# Patient Record
Sex: Female | Born: 1970
Health system: Southern US, Community
[De-identification: ages and names within clinical notes are randomized; demographics above are authoritative.]

## PROBLEM LIST (undated history)

## (undated) DIAGNOSIS — T783XXA Angioneurotic edema, initial encounter: Secondary | ICD-10-CM

## (undated) DIAGNOSIS — F419 Anxiety disorder, unspecified: Secondary | ICD-10-CM

## (undated) DIAGNOSIS — L509 Urticaria, unspecified: Secondary | ICD-10-CM

## (undated) DIAGNOSIS — R51 Headache: Secondary | ICD-10-CM

## (undated) DIAGNOSIS — F32A Depression, unspecified: Secondary | ICD-10-CM

## (undated) DIAGNOSIS — K589 Irritable bowel syndrome without diarrhea: Secondary | ICD-10-CM

## (undated) DIAGNOSIS — F319 Bipolar disorder, unspecified: Secondary | ICD-10-CM

## (undated) DIAGNOSIS — F329 Major depressive disorder, single episode, unspecified: Secondary | ICD-10-CM

## (undated) HISTORY — PX: FOOT SURGERY: SHX648

## (undated) HISTORY — DX: Urticaria, unspecified: L50.9

## (undated) HISTORY — DX: Headache: R51

## (undated) HISTORY — DX: Anxiety disorder, unspecified: F41.9

## (undated) HISTORY — DX: Major depressive disorder, single episode, unspecified: F32.9

## (undated) HISTORY — DX: Angioneurotic edema, initial encounter: T78.3XXA

## (undated) HISTORY — DX: Bipolar disorder, unspecified: F31.9

## (undated) HISTORY — DX: Irritable bowel syndrome, unspecified: K58.9

## (undated) HISTORY — DX: Depression, unspecified: F32.A

---

## 1998-08-24 ENCOUNTER — Other Ambulatory Visit: Admission: RE | Admit: 1998-08-24 | Discharge: 1998-08-24 | Payer: Self-pay | Admitting: *Deleted

## 1998-09-01 ENCOUNTER — Other Ambulatory Visit: Admission: RE | Admit: 1998-09-01 | Discharge: 1998-09-01 | Payer: Self-pay | Admitting: *Deleted

## 1999-11-15 ENCOUNTER — Other Ambulatory Visit: Admission: RE | Admit: 1999-11-15 | Discharge: 1999-11-15 | Payer: Self-pay | Admitting: Obstetrics and Gynecology

## 2008-09-14 ENCOUNTER — Encounter (HOSPITAL_COMMUNITY): Admission: RE | Admit: 2008-09-14 | Discharge: 2008-10-14 | Payer: Self-pay | Admitting: Unknown Physician Specialty

## 2009-10-25 ENCOUNTER — Encounter (HOSPITAL_COMMUNITY): Admission: RE | Admit: 2009-10-25 | Discharge: 2009-11-24 | Payer: Self-pay | Admitting: Unknown Physician Specialty

## 2010-05-22 ENCOUNTER — Emergency Department (HOSPITAL_COMMUNITY)
Admission: EM | Admit: 2010-05-22 | Discharge: 2010-05-23 | Payer: Self-pay | Source: Home / Self Care | Admitting: Emergency Medicine

## 2010-05-24 ENCOUNTER — Emergency Department (HOSPITAL_COMMUNITY)
Admission: EM | Admit: 2010-05-24 | Discharge: 2010-05-24 | Payer: Self-pay | Source: Home / Self Care | Admitting: Emergency Medicine

## 2010-05-24 LAB — PREGNANCY, URINE: Preg Test, Ur: NEGATIVE

## 2010-05-28 ENCOUNTER — Encounter: Payer: Self-pay | Admitting: Neurosurgery

## 2010-05-29 LAB — URINALYSIS, ROUTINE W REFLEX MICROSCOPIC
Ketones, ur: 15 mg/dL — AB
Leukocytes, UA: NEGATIVE
Nitrite: NEGATIVE
Specific Gravity, Urine: >1.03 — ABNORMAL HIGH (ref 1.005–1.030)
Urine Glucose, Fasting: NEGATIVE mg/dL
Urobilinogen, UA: 0.2 mg/dL (ref 0.0–1.0)
pH: 6 (ref 5.0–8.0)

## 2010-05-29 LAB — HEPATIC FUNCTION PANEL
ALT: 16 U/L (ref 0–35)
AST: 21 U/L (ref 0–37)
Albumin: 3.8 g/dL (ref 3.5–5.2)
Alkaline Phosphatase: 43 U/L (ref 39–117)
Bilirubin, Direct: <0.1 mg/dL (ref 0.0–0.3)
Total Bilirubin: 0.4 mg/dL (ref 0.3–1.2)
Total Protein: 6.9 g/dL (ref 6.0–8.3)

## 2010-05-29 LAB — CBC
HCT: 36.7 % (ref 36.0–46.0)
Hemoglobin: 13.1 g/dL (ref 12.0–15.0)
MCH: 31 pg (ref 26.0–34.0)
MCHC: 35.7 g/dL (ref 30.0–36.0)
MCV: 86.8 fL (ref 78.0–100.0)
Platelets: 232 10*3/uL (ref 150–400)
RBC: 4.23 MIL/uL (ref 3.87–5.11)
RDW: 13.1 % (ref 11.5–15.5)
WBC: 9.6 10*3/uL (ref 4.0–10.5)

## 2010-05-29 LAB — LIPASE, BLOOD: Lipase: 21 U/L (ref 11–59)

## 2010-05-29 LAB — BASIC METABOLIC PANEL
BUN: 12 mg/dL (ref 6–23)
CO2: 24 mEq/L (ref 19–32)
Calcium: 9.2 mg/dL (ref 8.4–10.5)
Chloride: 106 mEq/L (ref 96–112)
Creatinine, Ser: 0.81 mg/dL (ref 0.4–1.2)
GFR calc Af Amer: >60 mL/min (ref >60)
GFR calc non Af Amer: >60 mL/min (ref >60)
Glucose, Bld: 108 mg/dL — ABNORMAL HIGH (ref 70–99)
Potassium: 3.8 mEq/L (ref 3.5–5.1)
Sodium: 138 mEq/L (ref 135–145)

## 2010-05-29 LAB — URINE MICROSCOPIC-ADD ON

## 2010-05-29 LAB — DIFFERENTIAL
Basophils Absolute: 0 10*3/uL (ref 0.0–0.1)
Basophils Relative: 0 % (ref 0–1)
Eosinophils Absolute: 0 10*3/uL (ref 0.0–0.7)
Eosinophils Relative: 0 % (ref 0–5)
Lymphocytes Relative: 16 % (ref 12–46)
Lymphs Abs: 1.5 10*3/uL (ref 0.7–4.0)
Monocytes Absolute: 1.2 10*3/uL — ABNORMAL HIGH (ref 0.1–1.0)
Monocytes Relative: 13 % — ABNORMAL HIGH (ref 3–12)
Neutro Abs: 6.8 10*3/uL (ref 1.7–7.7)
Neutrophils Relative %: 71 % (ref 43–77)

## 2010-05-29 LAB — PREGNANCY, URINE: Preg Test, Ur: NEGATIVE

## 2010-06-04 ENCOUNTER — Emergency Department (HOSPITAL_COMMUNITY)
Admission: EM | Admit: 2010-06-04 | Discharge: 2010-06-04 | Payer: Self-pay | Source: Home / Self Care | Admitting: Emergency Medicine

## 2010-06-04 LAB — COMPREHENSIVE METABOLIC PANEL
ALT: 12 U/L (ref 0–35)
AST: 15 U/L (ref 0–37)
Albumin: 3.9 g/dL (ref 3.5–5.2)
Alkaline Phosphatase: 44 U/L (ref 39–117)
BUN: 10 mg/dL (ref 6–23)
CO2: 24 mEq/L (ref 19–32)
Calcium: 9.4 mg/dL (ref 8.4–10.5)
Chloride: 103 mEq/L (ref 96–112)
Creatinine, Ser: 0.95 mg/dL (ref 0.4–1.2)
GFR calc Af Amer: >60 mL/min (ref >60)
GFR calc non Af Amer: >60 mL/min (ref >60)
Glucose, Bld: 102 mg/dL — ABNORMAL HIGH (ref 70–99)
Potassium: 3.7 mEq/L (ref 3.5–5.1)
Sodium: 137 mEq/L (ref 135–145)
Total Bilirubin: 0.4 mg/dL (ref 0.3–1.2)
Total Protein: 7 g/dL (ref 6.0–8.3)

## 2010-06-04 LAB — CBC
HCT: 40.2 % (ref 36.0–46.0)
Hemoglobin: 13.8 g/dL (ref 12.0–15.0)
MCH: 30.3 pg (ref 26.0–34.0)
MCHC: 34.3 g/dL (ref 30.0–36.0)
MCV: 88.2 fL (ref 78.0–100.0)
Platelets: 261 10*3/uL (ref 150–400)
RBC: 4.56 MIL/uL (ref 3.87–5.11)
RDW: 13.1 % (ref 11.5–15.5)
WBC: 7.7 10*3/uL (ref 4.0–10.5)

## 2010-06-04 LAB — DIFFERENTIAL
Basophils Absolute: 0 10*3/uL (ref 0.0–0.1)
Basophils Relative: 0 % (ref 0–1)
Eosinophils Absolute: 0 10*3/uL (ref 0.0–0.7)
Eosinophils Relative: 0 % (ref 0–5)
Lymphocytes Relative: 23 % (ref 12–46)
Lymphs Abs: 1.8 10*3/uL (ref 0.7–4.0)
Monocytes Absolute: 0.6 10*3/uL (ref 0.1–1.0)
Monocytes Relative: 8 % (ref 3–12)
Neutro Abs: 5.3 10*3/uL (ref 1.7–7.7)
Neutrophils Relative %: 69 % (ref 43–77)

## 2010-06-04 LAB — POCT CARDIAC MARKERS
CKMB, poc: <1 ng/mL — ABNORMAL LOW (ref 1.0–8.0)
Myoglobin, poc: 43.7 ng/mL (ref 12–200)
Troponin i, poc: <0.05 ng/mL (ref 0.00–0.09)

## 2012-04-02 IMAGING — CT CT ABD-PELV W/ CM
2 of 5 series · 16 of 46 positions shown, 18 images · IV contrast (Omnipaque 300)
Comparison: None.

CLINICAL DATA: Right-sided abdominal pain with diarrhea.  History
of diverticulitis.

CT ABDOMEN AND PELVIS WITH CONTRAST
TECHNIQUE: Multidetector CT imaging of the abdomen and pelvis was
performed following the standard protocol during bolus
administration of intravenous contrast.
Contrast: 100 ml Imnipaque-QTT intravenously.

[Series 2: abd_pel_with 5.0 b40f · axial · 0.63mm/px · z∈[-456,-82]mm · 13 of 85 slices shown, 15 images]
[im 5/85  soft-tissue]
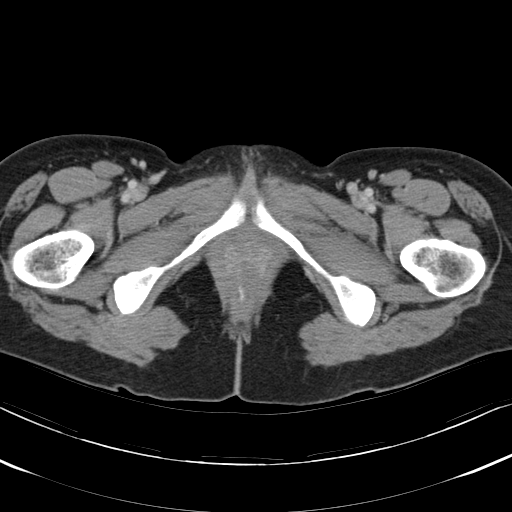
[im 5/85  bone]
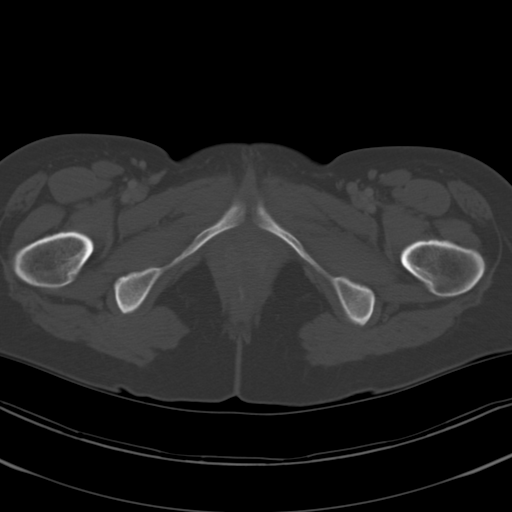
[im 10/85  soft-tissue]
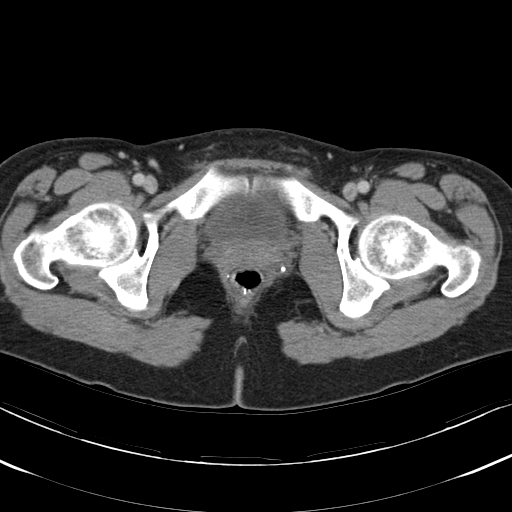
[im 19/85  soft-tissue]
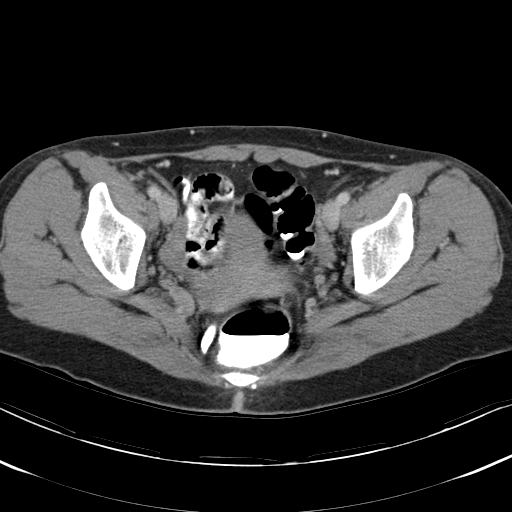
[im 24/85  soft-tissue]
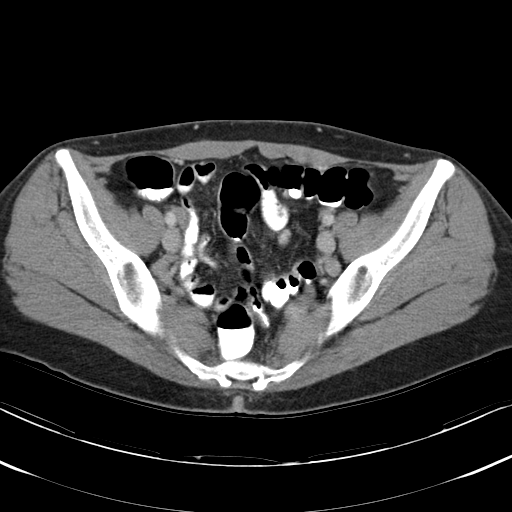
[im 29/85  soft-tissue]
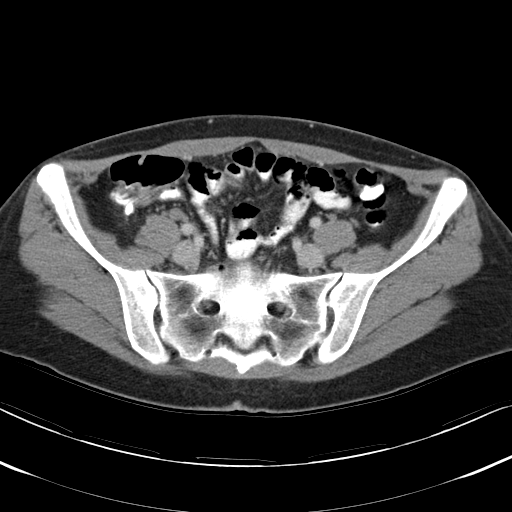
[im 38/85  soft-tissue]
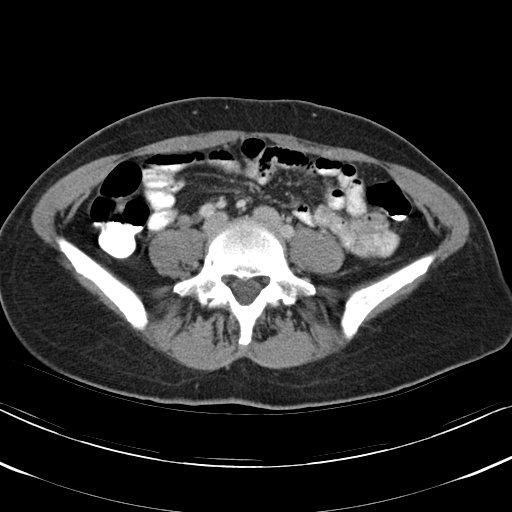
[im 43/85  soft-tissue]
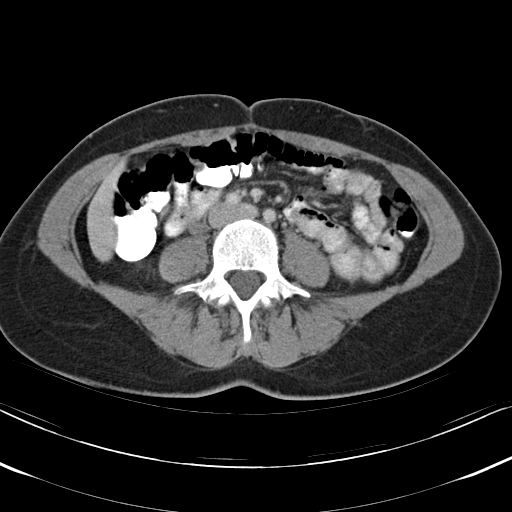
[im 47/85  soft-tissue]
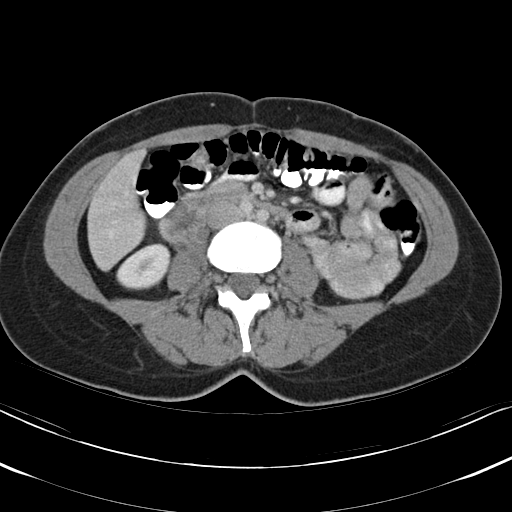
[im 57/85  soft-tissue]
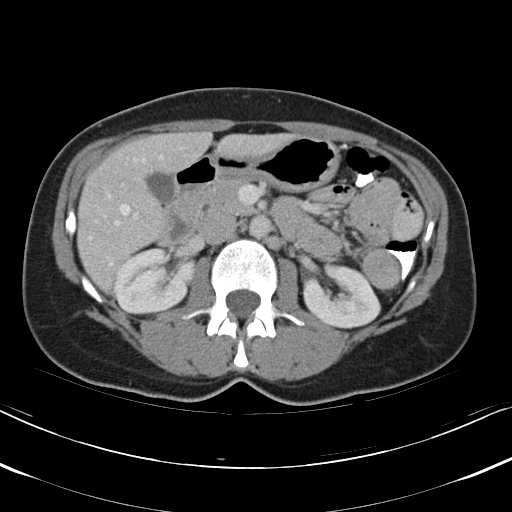
[im 57/85  bone]
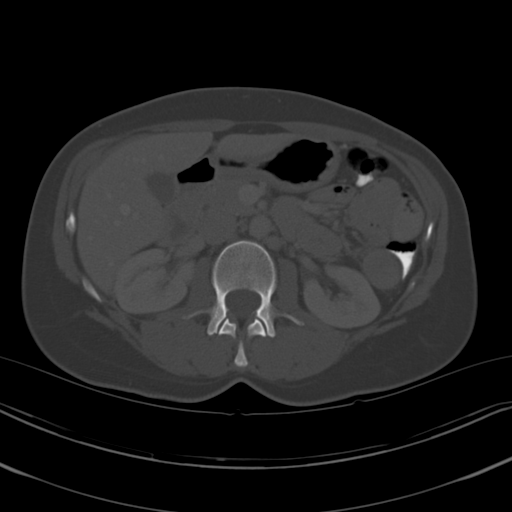
[im 61/85  soft-tissue]
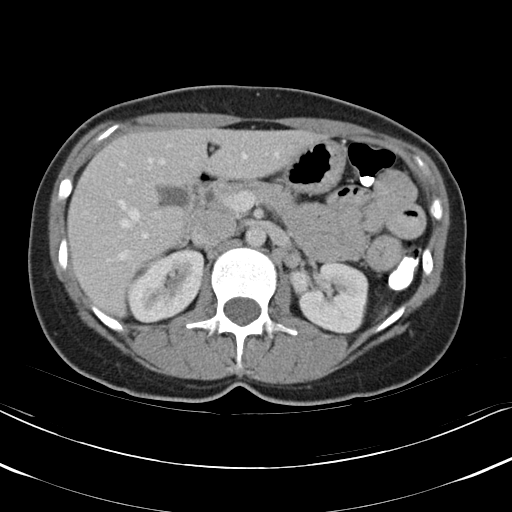
[im 66/85  soft-tissue]
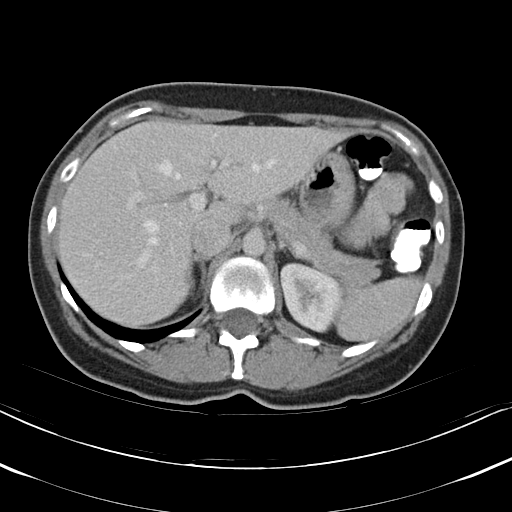
[im 75/85  soft-tissue]
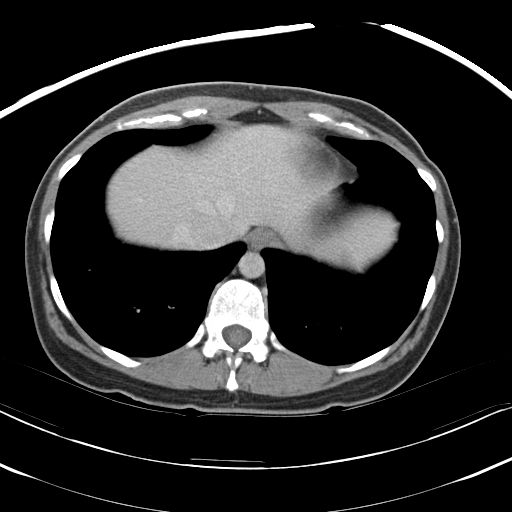
[im 80/85  soft-tissue]
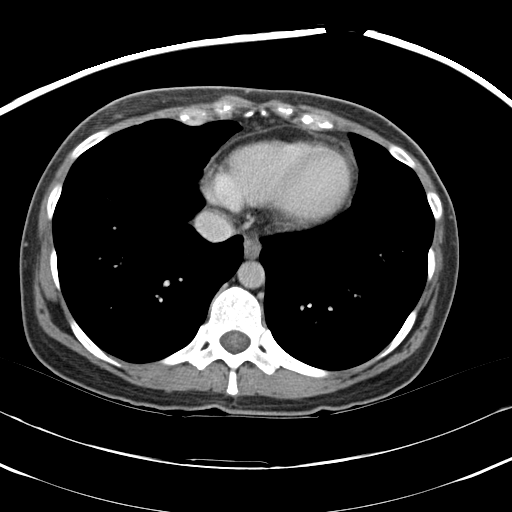

[Series 4: abd_pel_with 3.0 spo cor · coronal · 0.68mm/px · 3 of 70 slices shown]
[im 24/70  soft-tissue]
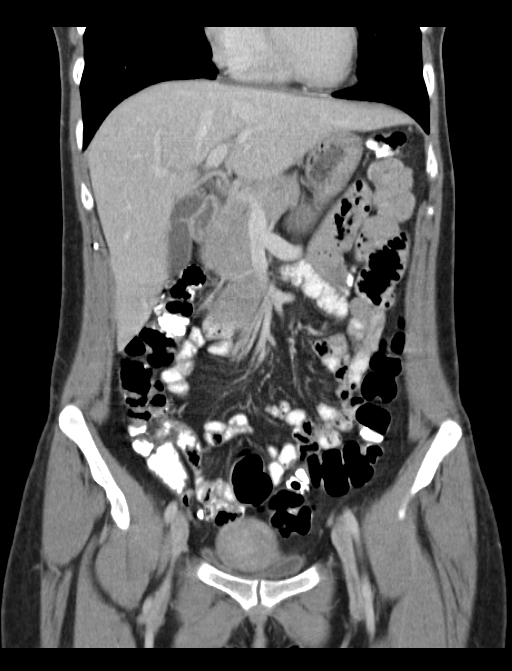
[im 31/70  soft-tissue]
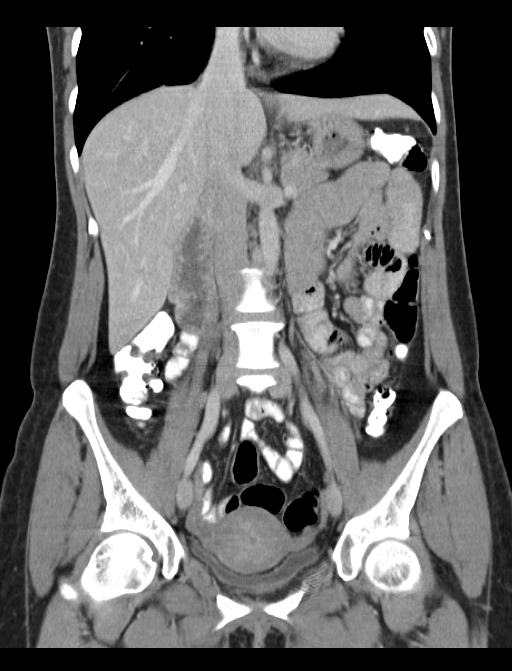
[im 39/70  soft-tissue]
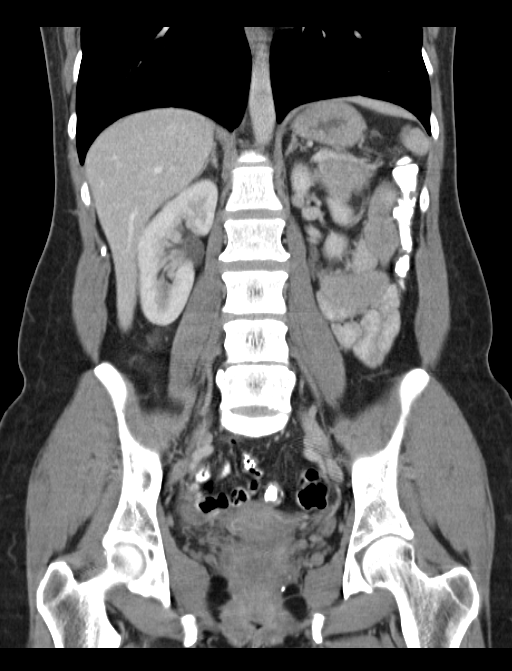

[16 of 46 positions shown; findings below may reference images not displayed]

FINDINGS: The lung bases are clear.  There is no pleural effusion.
The liver, spleen, gallbladder, pancreas, adrenal glands and
kidneys appear normal.  There is mild right-sided proximal
ureterectasis without delay in contrast excretion.  No urinary
tract calculi are demonstrated.

The bowel gas pattern is normal.  The appendix appears normal.
There are no inflammatory changes.  Portions of the sigmoid colon
are incompletely distended and suboptimally evaluated.  The uterus
and ovaries appear normal.

No osseous abnormalities are seen.
IMPRESSION: No acute or significant findings.  No evidence of appendicitis,
colitis or diverticulitis.

## 2013-02-13 ENCOUNTER — Ambulatory Visit (INDEPENDENT_AMBULATORY_CARE_PROVIDER_SITE_OTHER): Payer: Medicare Other | Admitting: Psychiatry

## 2013-02-13 ENCOUNTER — Encounter (HOSPITAL_COMMUNITY): Payer: Self-pay | Admitting: Psychiatry

## 2013-02-13 VITALS — BP 90/68 | Ht 64.0 in | Wt 111.0 lb

## 2013-02-13 DIAGNOSIS — F3162 Bipolar disorder, current episode mixed, moderate: Secondary | ICD-10-CM | POA: Insufficient documentation

## 2013-02-13 DIAGNOSIS — F411 Generalized anxiety disorder: Secondary | ICD-10-CM

## 2013-02-13 DIAGNOSIS — F3160 Bipolar disorder, current episode mixed, unspecified: Secondary | ICD-10-CM

## 2013-02-13 MED ORDER — ZIPRASIDONE HCL 20 MG PO CAPS: 20.0000 mg | ORAL_CAPSULE | Freq: Every day | ORAL | Status: DC

## 2013-02-13 MED ORDER — FLUOXETINE HCL 10 MG PO CAPS: 10.0000 mg | ORAL_CAPSULE | Freq: Every day | ORAL | Status: DC

## 2013-02-13 MED ORDER — QUETIAPINE FUMARATE 100 MG PO TABS: 200.0000 mg | ORAL_TABLET | Freq: Every day | ORAL | Status: DC

## 2013-02-13 MED ORDER — DIAZEPAM 5 MG PO TABS: 5.0000 mg | ORAL_TABLET | Freq: Three times a day (TID) | ORAL | Status: DC
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
## 2013-03-13 ENCOUNTER — Encounter (HOSPITAL_COMMUNITY): Payer: Self-pay | Admitting: Psychiatry

## 2013-03-13 ENCOUNTER — Ambulatory Visit (INDEPENDENT_AMBULATORY_CARE_PROVIDER_SITE_OTHER): Payer: Medicare Other | Admitting: Psychiatry

## 2013-03-13 VITALS — BP 90/58 | Ht 64.0 in | Wt 115.0 lb

## 2013-03-13 DIAGNOSIS — F3162 Bipolar disorder, current episode mixed, moderate: Secondary | ICD-10-CM

## 2013-03-13 DIAGNOSIS — F411 Generalized anxiety disorder: Secondary | ICD-10-CM

## 2013-03-13 DIAGNOSIS — F3160 Bipolar disorder, current episode mixed, unspecified: Secondary | ICD-10-CM

## 2013-03-13 MED ORDER — DIAZEPAM 10 MG PO TABS: 10.0000 mg | ORAL_TABLET | Freq: Three times a day (TID) | ORAL | Status: DC

## 2013-03-13 MED ORDER — BUPROPION HCL 75 MG PO TABS: 75.0000 mg | ORAL_TABLET | ORAL | Status: DC
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
## 2013-03-14 LAB — VALPROIC ACID LEVEL: Valproic Acid Lvl: 33.8 ug/mL — ABNORMAL LOW (ref 50.0–100.0)

## 2013-03-16 ENCOUNTER — Telehealth (HOSPITAL_COMMUNITY): Payer: Self-pay | Admitting: Psychiatry

## 2013-03-16 ENCOUNTER — Other Ambulatory Visit (HOSPITAL_COMMUNITY): Payer: Self-pay | Admitting: Psychiatry

## 2013-03-16 MED ORDER — DIVALPROEX SODIUM 250 MG PO DR TAB: DELAYED_RELEASE_TABLET | ORAL | Status: DC

## 2013-03-16 NOTE — Telephone Encounter (Signed)
Pt's Depakote level too low at 33.8-told to gradually increase to 500 mg bid.new script sent to pharmacy

## 2013-04-10 ENCOUNTER — Ambulatory Visit (INDEPENDENT_AMBULATORY_CARE_PROVIDER_SITE_OTHER): Payer: Medicare Other | Admitting: Psychiatry

## 2013-04-10 ENCOUNTER — Encounter (HOSPITAL_COMMUNITY): Payer: Self-pay | Admitting: Psychiatry

## 2013-04-10 VITALS — BP 82/60 | Ht 64.0 in | Wt 116.0 lb

## 2013-04-10 DIAGNOSIS — F3162 Bipolar disorder, current episode mixed, moderate: Secondary | ICD-10-CM

## 2013-04-10 DIAGNOSIS — F3160 Bipolar disorder, current episode mixed, unspecified: Secondary | ICD-10-CM

## 2013-04-10 DIAGNOSIS — F411 Generalized anxiety disorder: Secondary | ICD-10-CM

## 2013-04-10 MED ORDER — QUETIAPINE FUMARATE 100 MG PO TABS: 200.0000 mg | ORAL_TABLET | Freq: Every day | ORAL | Status: DC
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
## 2013-05-12 ENCOUNTER — Ambulatory Visit (INDEPENDENT_AMBULATORY_CARE_PROVIDER_SITE_OTHER): Payer: Medicare Other | Admitting: Psychiatry

## 2013-05-12 ENCOUNTER — Encounter (HOSPITAL_COMMUNITY): Payer: Self-pay | Admitting: Psychiatry

## 2013-05-12 VITALS — BP 90/66 | Ht 64.0 in | Wt 114.0 lb

## 2013-05-12 DIAGNOSIS — F3160 Bipolar disorder, current episode mixed, unspecified: Secondary | ICD-10-CM

## 2013-05-12 DIAGNOSIS — F3162 Bipolar disorder, current episode mixed, moderate: Secondary | ICD-10-CM

## 2013-05-12 DIAGNOSIS — F411 Generalized anxiety disorder: Secondary | ICD-10-CM

## 2013-05-12 MED ORDER — AMPHETAMINE-DEXTROAMPHETAMINE 15 MG PO TABS: 15.0000 mg | ORAL_TABLET | Freq: Every day | ORAL | Status: DC

## 2013-05-12 MED ORDER — ZIPRASIDONE HCL 20 MG PO CAPS: 20.0000 mg | ORAL_CAPSULE | Freq: Every day | ORAL | Status: DC
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
## 2013-06-08 ENCOUNTER — Ambulatory Visit (HOSPITAL_COMMUNITY): Payer: Self-pay | Admitting: Psychiatry

## 2013-06-15 ENCOUNTER — Ambulatory Visit (INDEPENDENT_AMBULATORY_CARE_PROVIDER_SITE_OTHER): Payer: Medicare Other | Admitting: Psychiatry

## 2013-06-15 ENCOUNTER — Encounter (HOSPITAL_COMMUNITY): Payer: Self-pay | Admitting: Psychiatry

## 2013-06-15 VITALS — BP 90/60 | Ht 64.0 in | Wt 117.0 lb

## 2013-06-15 DIAGNOSIS — F411 Generalized anxiety disorder: Secondary | ICD-10-CM

## 2013-06-15 DIAGNOSIS — F3160 Bipolar disorder, current episode mixed, unspecified: Secondary | ICD-10-CM

## 2013-06-15 DIAGNOSIS — F3162 Bipolar disorder, current episode mixed, moderate: Secondary | ICD-10-CM

## 2013-06-15 MED ORDER — METHYLPHENIDATE HCL ER (OSM) 18 MG PO TBCR
18.0000 mg | EXTENDED_RELEASE_TABLET | Freq: Every day | ORAL | Status: DC
Start: 2013-06-15 — End: 2013-09-01

## 2013-06-15 MED ORDER — DIVALPROEX SODIUM 250 MG PO DR TAB: DELAYED_RELEASE_TABLET | ORAL | Status: DC

## 2013-06-15 MED ORDER — ALPRAZOLAM 1 MG PO TABS: 1.0000 mg | ORAL_TABLET | Freq: Three times a day (TID) | ORAL | Status: DC | PRN

## 2013-06-15 MED ORDER — QUETIAPINE FUMARATE 100 MG PO TABS: 200.0000 mg | ORAL_TABLET | Freq: Every day | ORAL | Status: DC

## 2013-06-15 MED ORDER — METHYLPHENIDATE HCL ER (OSM) 18 MG PO TBCR: 18.0000 mg | EXTENDED_RELEASE_TABLET | Freq: Every day | ORAL | Status: DC
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
## 2013-08-13 ENCOUNTER — Ambulatory Visit (HOSPITAL_COMMUNITY): Payer: Self-pay | Admitting: Psychiatry

## 2013-08-18 ENCOUNTER — Ambulatory Visit (HOSPITAL_COMMUNITY): Payer: Self-pay | Admitting: Psychiatry

## 2013-08-31 ENCOUNTER — Ambulatory Visit (HOSPITAL_COMMUNITY): Payer: Self-pay | Admitting: Psychiatry

## 2013-09-01 ENCOUNTER — Encounter (HOSPITAL_COMMUNITY): Payer: Self-pay | Admitting: Psychiatry

## 2013-09-01 ENCOUNTER — Ambulatory Visit (INDEPENDENT_AMBULATORY_CARE_PROVIDER_SITE_OTHER): Payer: Medicare Other | Admitting: Psychiatry

## 2013-09-01 VITALS — BP 90/60 | Ht 64.0 in | Wt 120.0 lb

## 2013-09-01 DIAGNOSIS — F411 Generalized anxiety disorder: Secondary | ICD-10-CM

## 2013-09-01 DIAGNOSIS — F3160 Bipolar disorder, current episode mixed, unspecified: Secondary | ICD-10-CM

## 2013-09-01 DIAGNOSIS — F3162 Bipolar disorder, current episode mixed, moderate: Secondary | ICD-10-CM

## 2013-09-01 MED ORDER — ALPRAZOLAM 1 MG PO TABS: 1.0000 mg | ORAL_TABLET | Freq: Three times a day (TID) | ORAL | Status: DC | PRN

## 2013-09-01 MED ORDER — AMPHETAMINE-DEXTROAMPHETAMINE 15 MG PO TABS
15.0000 mg | ORAL_TABLET | Freq: Every day | ORAL | Status: DC
Start: 2013-09-01 — End: 2013-09-22

## 2013-09-01 MED ORDER — AMPHETAMINE-DEXTROAMPHETAMINE 15 MG PO TABS: 15.0000 mg | ORAL_TABLET | Freq: Every day | ORAL | Status: DC

## 2013-09-01 MED ORDER — QUETIAPINE FUMARATE 100 MG PO TABS: 200.0000 mg | ORAL_TABLET | Freq: Every day | ORAL | Status: DC

## 2013-09-01 MED ORDER — DIVALPROEX SODIUM 250 MG PO DR TAB: DELAYED_RELEASE_TABLET | ORAL | Status: DC
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
## 2013-09-22 ENCOUNTER — Ambulatory Visit (INDEPENDENT_AMBULATORY_CARE_PROVIDER_SITE_OTHER): Payer: Medicare Other | Admitting: Psychiatry

## 2013-09-22 ENCOUNTER — Encounter (HOSPITAL_COMMUNITY): Payer: Self-pay | Admitting: Psychiatry

## 2013-09-22 VITALS — BP 90/62 | Ht 64.0 in | Wt 116.0 lb

## 2013-09-22 DIAGNOSIS — F411 Generalized anxiety disorder: Secondary | ICD-10-CM

## 2013-09-22 DIAGNOSIS — F3162 Bipolar disorder, current episode mixed, moderate: Secondary | ICD-10-CM

## 2013-09-22 DIAGNOSIS — F3160 Bipolar disorder, current episode mixed, unspecified: Secondary | ICD-10-CM

## 2013-09-22 MED ORDER — AMPHETAMINE-DEXTROAMPHETAMINE 15 MG PO TABS: 15.0000 mg | ORAL_TABLET | Freq: Every day | ORAL | Status: DC
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
## 2013-09-29 ENCOUNTER — Ambulatory Visit (HOSPITAL_COMMUNITY): Payer: Self-pay | Admitting: Psychiatry

## 2013-10-26 ENCOUNTER — Telehealth (HOSPITAL_COMMUNITY): Payer: Self-pay | Admitting: *Deleted

## 2013-10-26 NOTE — Telephone Encounter (Signed)
done

## 2013-11-05 ENCOUNTER — Encounter (HOSPITAL_COMMUNITY): Payer: Self-pay | Admitting: Psychiatry

## 2013-11-05 ENCOUNTER — Ambulatory Visit (INDEPENDENT_AMBULATORY_CARE_PROVIDER_SITE_OTHER): Payer: Medicare Other | Admitting: Psychiatry

## 2013-11-05 VITALS — BP 88/60 | Ht 64.0 in | Wt 114.0 lb

## 2013-11-05 DIAGNOSIS — F3162 Bipolar disorder, current episode mixed, moderate: Secondary | ICD-10-CM

## 2013-11-05 DIAGNOSIS — F411 Generalized anxiety disorder: Secondary | ICD-10-CM

## 2013-11-05 DIAGNOSIS — F3160 Bipolar disorder, current episode mixed, unspecified: Secondary | ICD-10-CM

## 2013-11-05 MED ORDER — ALPRAZOLAM 1 MG PO TABS
1.0000 mg | ORAL_TABLET | Freq: Three times a day (TID) | ORAL | Status: DC | PRN
Start: 2013-11-05 — End: 2014-01-18

## 2013-11-05 MED ORDER — AMPHETAMINE-DEXTROAMPHETAMINE 15 MG PO TABS: 15.0000 mg | ORAL_TABLET | Freq: Two times a day (BID) | ORAL | Status: DC

## 2013-11-05 MED ORDER — QUETIAPINE FUMARATE 100 MG PO TABS: ORAL_TABLET | ORAL | Status: DC
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
## 2013-11-24 ENCOUNTER — Ambulatory Visit (HOSPITAL_COMMUNITY): Payer: Self-pay | Admitting: Psychiatry

## 2013-11-24 ENCOUNTER — Telehealth (HOSPITAL_COMMUNITY): Payer: Self-pay | Admitting: *Deleted

## 2013-11-24 NOTE — Telephone Encounter (Signed)
Told to stop Latuda, use benadryl

## 2013-12-17 ENCOUNTER — Encounter (HOSPITAL_COMMUNITY): Payer: Self-pay | Admitting: Psychiatry

## 2013-12-17 ENCOUNTER — Ambulatory Visit (INDEPENDENT_AMBULATORY_CARE_PROVIDER_SITE_OTHER): Payer: Medicare Other | Admitting: Psychiatry

## 2013-12-17 VITALS — BP 92/54 | HR 72 | Ht 64.0 in | Wt 113.8 lb

## 2013-12-17 DIAGNOSIS — F3160 Bipolar disorder, current episode mixed, unspecified: Secondary | ICD-10-CM

## 2013-12-17 DIAGNOSIS — F3162 Bipolar disorder, current episode mixed, moderate: Secondary | ICD-10-CM

## 2013-12-17 DIAGNOSIS — F411 Generalized anxiety disorder: Secondary | ICD-10-CM

## 2013-12-17 MED ORDER — QUETIAPINE FUMARATE 100 MG PO TABS: 100.0000 mg | ORAL_TABLET | Freq: Every day | ORAL | Status: DC

## 2013-12-17 MED ORDER — AMPHETAMINE-DEXTROAMPHETAMINE 15 MG PO TABS: 15.0000 mg | ORAL_TABLET | Freq: Two times a day (BID) | ORAL | Status: DC

## 2013-12-17 MED ORDER — VILAZODONE HCL 20 MG PO TABS: 20.0000 mg | ORAL_TABLET | Freq: Every day | ORAL | Status: DC
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
## 2013-12-21 ENCOUNTER — Telehealth (HOSPITAL_COMMUNITY): Payer: Self-pay | Admitting: *Deleted

## 2013-12-21 ENCOUNTER — Encounter (HOSPITAL_COMMUNITY): Payer: Self-pay | Admitting: Psychiatry

## 2013-12-21 MED ORDER — LURASIDONE HCL 20 MG PO TABS: 20.0000 mg | ORAL_TABLET | Freq: Every day | ORAL | Status: DC

## 2013-12-21 NOTE — Telephone Encounter (Signed)
pt calling stating that her Vibyrd she started taking is not working. She is having nausea and feels very sick when she takes it. Pt states she D/C meds. Pt states she would like to restart on Latuda or something else.

## 2013-12-21 NOTE — Telephone Encounter (Signed)
latuda 20 mg daily sent in

## 2013-12-25 ENCOUNTER — Telehealth (HOSPITAL_COMMUNITY): Payer: Self-pay | Admitting: *Deleted

## 2013-12-25 NOTE — Telephone Encounter (Signed)
called pt to inform her Dr. Harrington Challenger and I had called her insurance company to try to get her Latuda approved and we are currently waiting for a response. Pt agreed. Also informed pt that we still have not started the processes on her Vilbryd. Pt then stated her Pharmacy should have not requested for this med to get prior Auth because this medication made her sick and need to be off her med list. I informed pt due to what she is telling me on how this medication is making her feel, I will be putting this medication on her Allergy list. Pt agreed.

## 2013-12-25 NOTE — Telephone Encounter (Signed)
noted 

## 2013-12-28 ENCOUNTER — Encounter (HOSPITAL_COMMUNITY): Payer: Self-pay | Admitting: *Deleted

## 2013-12-28 NOTE — Progress Notes (Signed)
Prior Auth [id number] Medication: Latuda 20mg  Cardholder ID #: [id number] Group Number#: [id number] Authorize Dates: 12-25-2013 until 12-26-2015

## 2014-01-18 ENCOUNTER — Encounter (HOSPITAL_COMMUNITY): Payer: Self-pay | Admitting: Psychiatry

## 2014-01-18 ENCOUNTER — Ambulatory Visit (INDEPENDENT_AMBULATORY_CARE_PROVIDER_SITE_OTHER): Payer: Medicare Other | Admitting: Psychiatry

## 2014-01-18 VITALS — BP 107/62 | HR 103 | Ht 64.0 in | Wt 113.4 lb

## 2014-01-18 DIAGNOSIS — F411 Generalized anxiety disorder: Secondary | ICD-10-CM

## 2014-01-18 DIAGNOSIS — F3160 Bipolar disorder, current episode mixed, unspecified: Secondary | ICD-10-CM

## 2014-01-18 DIAGNOSIS — F3162 Bipolar disorder, current episode mixed, moderate: Secondary | ICD-10-CM

## 2014-01-18 MED ORDER — LURASIDONE HCL 20 MG PO TABS: 20.0000 mg | ORAL_TABLET | Freq: Every day | ORAL | Status: DC

## 2014-01-18 MED ORDER — ALPRAZOLAM 1 MG PO TABS: 1.0000 mg | ORAL_TABLET | Freq: Three times a day (TID) | ORAL | Status: DC | PRN

## 2014-01-18 MED ORDER — QUETIAPINE FUMARATE 100 MG PO TABS: 100.0000 mg | ORAL_TABLET | Freq: Every day | ORAL | Status: DC

## 2014-01-18 MED ORDER — AMPHETAMINE-DEXTROAMPHETAMINE 15 MG PO TABS
15.0000 mg | ORAL_TABLET | Freq: Two times a day (BID) | ORAL | Status: DC
Start: 2014-01-18 — End: 2014-03-18

## 2014-01-18 MED ORDER — AMPHETAMINE-DEXTROAMPHETAMINE 15 MG PO TABS: 15.0000 mg | ORAL_TABLET | Freq: Two times a day (BID) | ORAL | Status: DC
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
## 2014-03-18 ENCOUNTER — Telehealth (HOSPITAL_COMMUNITY): Payer: Self-pay | Admitting: *Deleted

## 2014-03-18 ENCOUNTER — Ambulatory Visit (INDEPENDENT_AMBULATORY_CARE_PROVIDER_SITE_OTHER): Payer: Medicare Other | Admitting: Psychiatry

## 2014-03-18 ENCOUNTER — Encounter (HOSPITAL_COMMUNITY): Payer: Self-pay | Admitting: Psychiatry

## 2014-03-18 VITALS — BP 110/55 | HR 82 | Ht 64.0 in | Wt 114.2 lb

## 2014-03-18 DIAGNOSIS — F411 Generalized anxiety disorder: Secondary | ICD-10-CM

## 2014-03-18 DIAGNOSIS — F3162 Bipolar disorder, current episode mixed, moderate: Secondary | ICD-10-CM

## 2014-03-18 MED ORDER — ALPRAZOLAM 1 MG PO TABS: 1.0000 mg | ORAL_TABLET | Freq: Three times a day (TID) | ORAL | Status: DC | PRN

## 2014-03-18 MED ORDER — QUETIAPINE FUMARATE 100 MG PO TABS: ORAL_TABLET | ORAL | Status: DC

## 2014-03-18 MED ORDER — AMPHETAMINE-DEXTROAMPHETAMINE 15 MG PO TABS: 15.0000 mg | ORAL_TABLET | Freq: Two times a day (BID) | ORAL | Status: DC
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
## 2014-04-02 ENCOUNTER — Ambulatory Visit (HOSPITAL_COMMUNITY): Payer: Self-pay | Admitting: Psychiatry

## 2014-05-18 ENCOUNTER — Ambulatory Visit (HOSPITAL_COMMUNITY): Payer: Self-pay | Admitting: Psychiatry

## 2014-06-04 ENCOUNTER — Ambulatory Visit (HOSPITAL_COMMUNITY): Payer: Self-pay | Admitting: Psychiatry

## 2014-06-10 ENCOUNTER — Encounter (HOSPITAL_COMMUNITY): Payer: Self-pay | Admitting: Psychiatry

## 2014-06-10 ENCOUNTER — Ambulatory Visit (INDEPENDENT_AMBULATORY_CARE_PROVIDER_SITE_OTHER): Payer: Medicare Other | Admitting: Psychiatry

## 2014-06-10 VITALS — BP 98/61 | HR 95 | Ht 64.0 in | Wt 115.0 lb

## 2014-06-10 DIAGNOSIS — F411 Generalized anxiety disorder: Secondary | ICD-10-CM

## 2014-06-10 DIAGNOSIS — F3162 Bipolar disorder, current episode mixed, moderate: Secondary | ICD-10-CM

## 2014-06-10 MED ORDER — QUETIAPINE FUMARATE 100 MG PO TABS: ORAL_TABLET | ORAL | Status: DC

## 2014-06-10 MED ORDER — DIAZEPAM 10 MG PO TABS: 10.0000 mg | ORAL_TABLET | Freq: Four times a day (QID) | ORAL | Status: DC

## 2014-06-10 MED ORDER — AMPHETAMINE-DEXTROAMPHETAMINE 15 MG PO TABS: 15.0000 mg | ORAL_TABLET | Freq: Two times a day (BID) | ORAL | Status: DC
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
## 2014-07-08 ENCOUNTER — Encounter (HOSPITAL_COMMUNITY): Payer: Self-pay | Admitting: Psychiatry

## 2014-07-08 ENCOUNTER — Ambulatory Visit (INDEPENDENT_AMBULATORY_CARE_PROVIDER_SITE_OTHER): Payer: Medicare Other | Admitting: Psychiatry

## 2014-07-08 VITALS — BP 92/63 | HR 98 | Ht 64.0 in | Wt 118.2 lb

## 2014-07-08 DIAGNOSIS — F3162 Bipolar disorder, current episode mixed, moderate: Secondary | ICD-10-CM | POA: Diagnosis not present

## 2014-07-08 DIAGNOSIS — F411 Generalized anxiety disorder: Secondary | ICD-10-CM | POA: Diagnosis not present

## 2014-07-08 MED ORDER — AMPHETAMINE-DEXTROAMPHETAMINE 15 MG PO TABS: 15.0000 mg | ORAL_TABLET | Freq: Two times a day (BID) | ORAL | Status: DC

## 2014-07-08 MED ORDER — SERTRALINE HCL 50 MG PO TABS
50.0000 mg | ORAL_TABLET | Freq: Every day | ORAL | Status: DC
Start: 2014-07-08 — End: 2014-09-09

## 2014-07-08 MED ORDER — QUETIAPINE FUMARATE 100 MG PO TABS: ORAL_TABLET | ORAL | Status: DC
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
## 2014-08-18 ENCOUNTER — Encounter (HOSPITAL_COMMUNITY): Payer: Self-pay | Admitting: Psychiatry

## 2014-08-18 ENCOUNTER — Ambulatory Visit (INDEPENDENT_AMBULATORY_CARE_PROVIDER_SITE_OTHER): Payer: Medicare Other | Admitting: Psychiatry

## 2014-08-18 VITALS — BP 101/57 | HR 78 | Ht 64.0 in | Wt 112.0 lb

## 2014-08-18 DIAGNOSIS — F411 Generalized anxiety disorder: Secondary | ICD-10-CM | POA: Diagnosis not present

## 2014-08-18 DIAGNOSIS — F3160 Bipolar disorder, current episode mixed, unspecified: Secondary | ICD-10-CM

## 2014-08-18 DIAGNOSIS — F3162 Bipolar disorder, current episode mixed, moderate: Secondary | ICD-10-CM

## 2014-08-18 MED ORDER — AMPHETAMINE-DEXTROAMPHETAMINE 15 MG PO TABS: 15.0000 mg | ORAL_TABLET | Freq: Two times a day (BID) | ORAL | Status: DC

## 2014-08-18 MED ORDER — DIAZEPAM 10 MG PO TABS: 10.0000 mg | ORAL_TABLET | Freq: Four times a day (QID) | ORAL | Status: DC

## 2014-08-18 MED ORDER — QUETIAPINE FUMARATE 100 MG PO TABS: ORAL_TABLET | ORAL | Status: DC
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
## 2014-08-23 ENCOUNTER — Telehealth (HOSPITAL_COMMUNITY): Payer: Self-pay | Admitting: *Deleted

## 2014-08-23 NOTE — Telephone Encounter (Signed)
Pt called stating that since she's been on Zoloft, she have been feeling nauseous, sick to her stomach all the way into the next day. Per pt she have also been fatigue as well. Pt number is [phone].

## 2014-08-24 NOTE — Telephone Encounter (Signed)
Please tell her to d/c it

## 2014-08-27 NOTE — Telephone Encounter (Signed)
Pt is aware and showed understanding. Pt made a sooner appt to come in due to not feeling well.

## 2014-08-31 ENCOUNTER — Other Ambulatory Visit (HOSPITAL_COMMUNITY): Payer: Self-pay | Admitting: Psychiatry

## 2014-08-31 ENCOUNTER — Telehealth (HOSPITAL_COMMUNITY): Payer: Self-pay | Admitting: *Deleted

## 2014-08-31 NOTE — Telephone Encounter (Signed)
noted 

## 2014-08-31 NOTE — Telephone Encounter (Signed)
phone call from patient.  everything has gotten worse since her last visit, can't eat, feels like getting on a plane, leaving and never looking back.   feels like she is crashing.

## 2014-08-31 NOTE — Telephone Encounter (Signed)
Called pt, she is not suicidal, will come in on May 5

## 2014-09-09 ENCOUNTER — Encounter (HOSPITAL_COMMUNITY): Payer: Self-pay | Admitting: Psychiatry

## 2014-09-09 ENCOUNTER — Ambulatory Visit (INDEPENDENT_AMBULATORY_CARE_PROVIDER_SITE_OTHER): Payer: Medicare Other | Admitting: Psychiatry

## 2014-09-09 VITALS — BP 82/60 | Ht 64.0 in | Wt 112.0 lb

## 2014-09-09 DIAGNOSIS — F411 Generalized anxiety disorder: Secondary | ICD-10-CM | POA: Diagnosis not present

## 2014-09-09 DIAGNOSIS — F3160 Bipolar disorder, current episode mixed, unspecified: Secondary | ICD-10-CM | POA: Diagnosis not present

## 2014-09-09 DIAGNOSIS — F3162 Bipolar disorder, current episode mixed, moderate: Secondary | ICD-10-CM

## 2014-09-09 MED ORDER — ALPRAZOLAM 1 MG PO TABS: 1.0000 mg | ORAL_TABLET | Freq: Four times a day (QID) | ORAL | Status: DC

## 2014-09-09 MED ORDER — AMPHETAMINE-DEXTROAMPHETAMINE 15 MG PO TABS: 15.0000 mg | ORAL_TABLET | Freq: Two times a day (BID) | ORAL | Status: DC

## 2014-09-09 MED ORDER — DULOXETINE HCL 30 MG PO CPEP: 30.0000 mg | ORAL_CAPSULE | Freq: Two times a day (BID) | ORAL | Status: DC
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
## 2014-09-13 ENCOUNTER — Telehealth (HOSPITAL_COMMUNITY): Payer: Self-pay | Admitting: *Deleted
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
## 2014-09-14 NOTE — Telephone Encounter (Signed)
noted 

## 2014-09-29 ENCOUNTER — Ambulatory Visit (HOSPITAL_COMMUNITY): Payer: Self-pay | Admitting: Psychiatry

## 2014-10-13 ENCOUNTER — Ambulatory Visit (INDEPENDENT_AMBULATORY_CARE_PROVIDER_SITE_OTHER): Payer: Medicare Other | Admitting: Psychiatry

## 2014-10-13 ENCOUNTER — Encounter (HOSPITAL_COMMUNITY): Payer: Self-pay | Admitting: Psychiatry

## 2014-10-13 VITALS — BP 91/58 | HR 103 | Ht 64.0 in | Wt 106.0 lb

## 2014-10-13 DIAGNOSIS — F3160 Bipolar disorder, current episode mixed, unspecified: Secondary | ICD-10-CM

## 2014-10-13 DIAGNOSIS — F411 Generalized anxiety disorder: Secondary | ICD-10-CM

## 2014-10-13 DIAGNOSIS — F3162 Bipolar disorder, current episode mixed, moderate: Secondary | ICD-10-CM

## 2014-10-13 MED ORDER — AMPHETAMINE-DEXTROAMPHETAMINE 15 MG PO TABS: 15.0000 mg | ORAL_TABLET | Freq: Two times a day (BID) | ORAL | Status: DC

## 2014-10-13 MED ORDER — DULOXETINE HCL 30 MG PO CPEP: 30.0000 mg | ORAL_CAPSULE | Freq: Two times a day (BID) | ORAL | Status: DC

## 2014-10-13 MED ORDER — QUETIAPINE FUMARATE 100 MG PO TABS: ORAL_TABLET | ORAL | Status: DC
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
## 2014-11-01 ENCOUNTER — Telehealth (HOSPITAL_COMMUNITY): Payer: Self-pay | Admitting: *Deleted

## 2014-11-01 NOTE — Telephone Encounter (Signed)
Pt called stating that she have been getting lightheadedness and dizzy. Per pt she had passed out so she went to her PCP (Dr. Manuella Ghazi) and he wanted Dr. Harrington Challenger to see if Seroquel dosage could be the one is causing her dizziness. Per pt, it this is the issue, then she would like to know if Dr. Harrington Challenger could try putting her on another sleeping meds instead. Per pt she is taking Valium with the Seroquel at night but not Xanax at night. Per pt, she feels like she have lost her strength. Pt number is [phone]. Pt f/u appt is scheduled for 12-09-14 and f/u with her PCP next month.

## 2014-11-02 NOTE — Telephone Encounter (Signed)
She stopped cymbalta and this should help doubt this is from Seroquel

## 2014-11-09 NOTE — Telephone Encounter (Signed)
noted 

## 2014-12-09 ENCOUNTER — Ambulatory Visit (HOSPITAL_COMMUNITY): Payer: Self-pay | Admitting: Psychiatry

## 2015-01-04 ENCOUNTER — Encounter (HOSPITAL_COMMUNITY): Payer: Self-pay | Admitting: Psychiatry

## 2015-01-04 ENCOUNTER — Ambulatory Visit (INDEPENDENT_AMBULATORY_CARE_PROVIDER_SITE_OTHER): Payer: Medicare Other | Admitting: Psychiatry

## 2015-01-04 VITALS — BP 97/56 | HR 83 | Ht 64.0 in | Wt 108.4 lb

## 2015-01-04 DIAGNOSIS — F3160 Bipolar disorder, current episode mixed, unspecified: Secondary | ICD-10-CM

## 2015-01-04 DIAGNOSIS — F3162 Bipolar disorder, current episode mixed, moderate: Secondary | ICD-10-CM

## 2015-01-04 DIAGNOSIS — F411 Generalized anxiety disorder: Secondary | ICD-10-CM

## 2015-01-04 MED ORDER — AMPHETAMINE-DEXTROAMPHETAMINE 15 MG PO TABS: 15.0000 mg | ORAL_TABLET | Freq: Two times a day (BID) | ORAL | Status: DC

## 2015-01-04 MED ORDER — LORAZEPAM 1 MG PO TABS: 1.0000 mg | ORAL_TABLET | Freq: Four times a day (QID) | ORAL | Status: DC

## 2015-01-04 MED ORDER — QUETIAPINE FUMARATE 100 MG PO TABS: ORAL_TABLET | ORAL | Status: DC
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
## 2015-03-03 ENCOUNTER — Ambulatory Visit (INDEPENDENT_AMBULATORY_CARE_PROVIDER_SITE_OTHER): Payer: Medicare Other | Admitting: Psychiatry

## 2015-03-03 ENCOUNTER — Encounter (HOSPITAL_COMMUNITY): Payer: Self-pay | Admitting: Psychiatry

## 2015-03-03 VITALS — BP 94/61 | HR 105 | Ht 64.0 in | Wt 112.0 lb

## 2015-03-03 DIAGNOSIS — F411 Generalized anxiety disorder: Secondary | ICD-10-CM

## 2015-03-03 DIAGNOSIS — F3162 Bipolar disorder, current episode mixed, moderate: Secondary | ICD-10-CM

## 2015-03-03 MED ORDER — QUETIAPINE FUMARATE 100 MG PO TABS: ORAL_TABLET | ORAL | Status: DC

## 2015-03-03 MED ORDER — ALPRAZOLAM 1 MG PO TABS: 1.0000 mg | ORAL_TABLET | Freq: Three times a day (TID) | ORAL | Status: DC

## 2015-03-03 MED ORDER — AMPHETAMINE-DEXTROAMPHETAMINE 15 MG PO TABS: 15.0000 mg | ORAL_TABLET | Freq: Two times a day (BID) | ORAL | Status: DC
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
## 2015-04-22 ENCOUNTER — Encounter (HOSPITAL_COMMUNITY): Payer: Self-pay | Admitting: Psychiatry

## 2015-04-22 ENCOUNTER — Ambulatory Visit (INDEPENDENT_AMBULATORY_CARE_PROVIDER_SITE_OTHER): Payer: Medicare Other | Admitting: Psychiatry

## 2015-04-22 VITALS — BP 108/62 | Ht 64.0 in | Wt 113.0 lb

## 2015-04-22 DIAGNOSIS — F419 Anxiety disorder, unspecified: Secondary | ICD-10-CM | POA: Diagnosis not present

## 2015-04-22 DIAGNOSIS — F3162 Bipolar disorder, current episode mixed, moderate: Secondary | ICD-10-CM

## 2015-04-22 MED ORDER — QUETIAPINE FUMARATE 100 MG PO TABS: ORAL_TABLET | ORAL | Status: DC

## 2015-04-22 MED ORDER — QUETIAPINE FUMARATE ER 50 MG PO TB24: 50.0000 mg | ORAL_TABLET | ORAL | Status: DC

## 2015-04-22 MED ORDER — AMPHETAMINE-DEXTROAMPHETAMINE 15 MG PO TABS: 15.0000 mg | ORAL_TABLET | Freq: Two times a day (BID) | ORAL | Status: DC

## 2015-04-22 MED ORDER — ALPRAZOLAM 1 MG PO TABS: 1.0000 mg | ORAL_TABLET | Freq: Three times a day (TID) | ORAL | Status: DC
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
## 2015-05-31 ENCOUNTER — Other Ambulatory Visit (HOSPITAL_COMMUNITY): Payer: Self-pay | Admitting: Psychiatry

## 2015-05-31 ENCOUNTER — Telehealth (HOSPITAL_COMMUNITY): Payer: Self-pay | Admitting: *Deleted

## 2015-05-31 MED ORDER — QUETIAPINE FUMARATE 100 MG PO TABS: ORAL_TABLET | ORAL | Status: DC

## 2015-05-31 MED ORDER — QUETIAPINE FUMARATE ER 50 MG PO TB24: 50.0000 mg | ORAL_TABLET | ORAL | Status: DC

## 2015-05-31 NOTE — Telephone Encounter (Signed)
Pt pharmacy requesting a 90 days supply for pt Seroquel 100 mg due to insurance. Pt pharmacy number is [phone]

## 2015-05-31 NOTE — Telephone Encounter (Signed)
done

## 2015-05-31 NOTE — Telephone Encounter (Signed)
noted 

## 2015-06-23 ENCOUNTER — Encounter (HOSPITAL_COMMUNITY): Payer: Self-pay | Admitting: Psychiatry

## 2015-06-23 ENCOUNTER — Ambulatory Visit (INDEPENDENT_AMBULATORY_CARE_PROVIDER_SITE_OTHER): Payer: Medicare HMO | Admitting: Psychiatry

## 2015-06-23 VITALS — BP 108/63 | HR 111 | Ht 64.0 in | Wt 111.8 lb

## 2015-06-23 DIAGNOSIS — F3162 Bipolar disorder, current episode mixed, moderate: Secondary | ICD-10-CM | POA: Diagnosis not present

## 2015-06-23 MED ORDER — AMPHETAMINE-DEXTROAMPHETAMINE 15 MG PO TABS: 15.0000 mg | ORAL_TABLET | Freq: Two times a day (BID) | ORAL | Status: DC

## 2015-06-23 MED ORDER — QUETIAPINE FUMARATE ER 50 MG PO TB24
100.0000 mg | ORAL_TABLET | Freq: Every day | ORAL | Status: DC
Start: 2015-06-23 — End: 2015-10-13

## 2015-06-23 MED ORDER — ALPRAZOLAM 1 MG PO TABS
1.0000 mg | ORAL_TABLET | Freq: Three times a day (TID) | ORAL | Status: DC
Start: 2015-06-23 — End: 2015-10-13

## 2015-06-23 MED ORDER — QUETIAPINE FUMARATE 100 MG PO TABS: ORAL_TABLET | ORAL | Status: DC
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
## 2015-08-15 ENCOUNTER — Telehealth (HOSPITAL_COMMUNITY): Payer: Self-pay | Admitting: *Deleted

## 2015-08-15 NOTE — Telephone Encounter (Signed)
Per pt, she do not intend on going to another provider and is aware that her medication is too early to refill. Per pt, she just wanted to make sure that she don't run out of medications while her insurance try to work out how they can approve Dr. Harrington Challenger under her plan. Pt number is [phone].

## 2015-08-15 NOTE — Telephone Encounter (Signed)
Pt called stating she have medications until 08-30-2015 but will run out of medications after then. Per pt, she have [employer] and is looking into how to get Dr. Harrington Challenger approved. Per pt, she would need refills for her Adderall and Xanax. Pt umber is [phone].

## 2015-08-15 NOTE — Telephone Encounter (Signed)
Too early to refill now. If she is going to another provider we can only provide 30 days of refills.

## 2015-08-18 ENCOUNTER — Ambulatory Visit (HOSPITAL_COMMUNITY): Payer: Self-pay | Admitting: Psychiatry

## 2015-08-23 ENCOUNTER — Telehealth (HOSPITAL_COMMUNITY): Payer: Self-pay | Admitting: *Deleted

## 2015-08-23 NOTE — Telephone Encounter (Signed)
Pt called stating she will be out of her Adderall on 08-30-15 and would like to see if Dr. Harrington Challenger could print her refill. Pt number is [phone]. Pt medication last filled on 06-23-15 with 2 prints.

## 2015-08-29 ENCOUNTER — Other Ambulatory Visit (HOSPITAL_COMMUNITY): Payer: Self-pay | Admitting: Psychiatry

## 2015-08-29 MED ORDER — AMPHETAMINE-DEXTROAMPHETAMINE 15 MG PO TABS: 15.0000 mg | ORAL_TABLET | Freq: Two times a day (BID) | ORAL | Status: DC

## 2015-08-29 NOTE — Telephone Encounter (Signed)
printed

## 2015-08-29 NOTE — Telephone Encounter (Signed)
Spoke with pt and informed her that script is ready for pick up.

## 2015-09-05 ENCOUNTER — Encounter (HOSPITAL_COMMUNITY): Payer: Self-pay | Admitting: *Deleted

## 2015-09-05 NOTE — Progress Notes (Signed)
Pt came into office to pick up her Addreall. Pt D/L number is [phone].

## 2015-09-29 ENCOUNTER — Ambulatory Visit (HOSPITAL_COMMUNITY): Payer: Self-pay | Admitting: Psychiatry

## 2015-10-11 ENCOUNTER — Telehealth (HOSPITAL_COMMUNITY): Payer: Self-pay | Admitting: *Deleted

## 2015-10-11 NOTE — Telephone Encounter (Signed)
Called the office insurance person to call [employer] with the code that was provided by pt and they stated they will call [employer] for more information.

## 2015-10-11 NOTE — Telephone Encounter (Signed)
patient called, said Harle Battiest told her to call two days before appointment regarding [employer]'s approval.

## 2015-10-12 NOTE — Telephone Encounter (Signed)
lmtcb

## 2015-10-12 NOTE — Telephone Encounter (Signed)
Called pt and received the voicemail. lmtcb and number was provided

## 2015-10-13 ENCOUNTER — Encounter (HOSPITAL_COMMUNITY): Payer: Self-pay | Admitting: Psychiatry

## 2015-10-13 ENCOUNTER — Telehealth (HOSPITAL_COMMUNITY): Payer: Self-pay | Admitting: *Deleted

## 2015-10-13 ENCOUNTER — Ambulatory Visit (INDEPENDENT_AMBULATORY_CARE_PROVIDER_SITE_OTHER): Payer: Medicare HMO | Admitting: Psychiatry

## 2015-10-13 ENCOUNTER — Other Ambulatory Visit (HOSPITAL_COMMUNITY): Payer: Self-pay | Admitting: Psychiatry

## 2015-10-13 VITALS — BP 81/49 | HR 95 | Ht 64.0 in | Wt 112.0 lb

## 2015-10-13 DIAGNOSIS — F411 Generalized anxiety disorder: Secondary | ICD-10-CM | POA: Diagnosis not present

## 2015-10-13 DIAGNOSIS — F3162 Bipolar disorder, current episode mixed, moderate: Secondary | ICD-10-CM

## 2015-10-13 MED ORDER — QUETIAPINE FUMARATE ER 50 MG PO TB24: 100.0000 mg | ORAL_TABLET | Freq: Every day | ORAL | Status: DC

## 2015-10-13 MED ORDER — AMPHETAMINE-DEXTROAMPHETAMINE 15 MG PO TABS: 15.0000 mg | ORAL_TABLET | Freq: Two times a day (BID) | ORAL | Status: DC

## 2015-10-13 MED ORDER — QUETIAPINE FUMARATE 100 MG PO TABS: ORAL_TABLET | ORAL | Status: DC

## 2015-10-13 MED ORDER — ALPRAZOLAM 1 MG PO TABS: 1.0000 mg | ORAL_TABLET | Freq: Three times a day (TID) | ORAL | Status: DC

## 2015-10-13 NOTE — Telephone Encounter (Signed)
Pt came into office for f/u visit with provider and printed scripts were given to her then.

## 2015-10-13 NOTE — Telephone Encounter (Signed)
Called pt insurance to get Auth approval for medication management and therapy. Auth approval for medication management is [id number] and good for 15 visits starting 10-13-2015 until 05-06-2016. Auth approval for Therapy is [id number] with 20 visits starting 10-13-2015 until 05-06-2016. Spoke with Caryl Pina who stated letter of auth of approval will be mailed to office. [phone].

## 2015-10-13 NOTE — Telephone Encounter (Signed)
Done, she will have to pick up xanax and adderall, others sent

## 2015-10-13 NOTE — Telephone Encounter (Signed)
Pt called asking if Dr. Harrington Challenger could please fill her medications that she prescribed for her. Per pt, she have [employer] and called them to see if Dr. Harrington Challenger was in network several months ago. Per pt, after back and fourth with the insurance company, she was told that office had to call them and request a 022 waiver form for them to pay for Dr. Harrington Challenger visits. Per pt, the person she talked to at her insurance company informed her that it was on a case by case bases. Sent this message to Levada Dy (person that does insurance for office) via email. Per Arbutus Ped email, she spoke with [facility] @ [employer] [phone] x [phone] who stated that Dr. Harrington Challenger is out of network for this patient's plan and they will not pay for her to be seen by Harrington Challenger. Called pt to inform her of this. Pt did not liked the information that was given to her and stated after she hangs up she will call [employer] again. Per pt, she is out of refills and don't know what to do. Per pt for staff to ask Dr. Harrington Challenger if she could fill her medications for her with an extra refill while she tries to figure out what to do with her insurance. Per pt, she can not afford to pay for office visits out of pocket. Pt number is [phone].
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
## 2015-10-13 NOTE — Telephone Encounter (Signed)
Pt called asking if Dr. Harrington Challenger could please fill her medications that she prescribed for her. Per pt, she have [employer] and called them to see if Dr. Harrington Challenger was in network several months ago. Per pt, after back and fourth with the insurance company, she was told that office had to call them and request a 022 waiver form for them to pay for Dr. Harrington Challenger visits. Per pt, the person she talked to at her insurance company informed her that it was on a case by case bases. Sent this message to Levada Dy (person that does insurance for office) via email. Per Ar[employer] Ped email, she spoke with [facility] @ [employer] [phone] x [phone] who stated that Dr. Harrington Challenger is out of network for this patient's plan and they will not pay for her to be seen by Harrington Challenger. Called pt to inform her of this. Pt did not liked the information that was given to her and stated after she hangs up she will call [employer] again. Per pt, she is out of refills and don't know what to do. Per pt for staff to ask Dr. Harrington Challenger if she could fill her medications for her with an extra refill while she tries to figure out what to do with her insurance. Per pt, she can not afford to pay for office visits out of pocket. Pt number is [phone].

## 2015-11-03 ENCOUNTER — Encounter (HOSPITAL_COMMUNITY): Payer: Self-pay | Admitting: Psychiatry

## 2015-11-03 ENCOUNTER — Ambulatory Visit (INDEPENDENT_AMBULATORY_CARE_PROVIDER_SITE_OTHER): Payer: Medicare HMO | Admitting: Psychiatry

## 2015-11-03 DIAGNOSIS — F3162 Bipolar disorder, current episode mixed, moderate: Secondary | ICD-10-CM

## 2015-11-03 NOTE — Patient Instructions (Signed)
Discussed orally 
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
## 2015-11-22 ENCOUNTER — Encounter (HOSPITAL_COMMUNITY): Payer: Self-pay | Admitting: Psychiatry

## 2015-11-22 ENCOUNTER — Ambulatory Visit (INDEPENDENT_AMBULATORY_CARE_PROVIDER_SITE_OTHER): Payer: Medicare HMO | Admitting: Psychiatry

## 2015-11-22 VITALS — BP 100/55 | HR 69 | Ht 64.0 in | Wt 111.6 lb

## 2015-11-22 DIAGNOSIS — F3162 Bipolar disorder, current episode mixed, moderate: Secondary | ICD-10-CM | POA: Diagnosis not present

## 2015-11-22 DIAGNOSIS — F419 Anxiety disorder, unspecified: Secondary | ICD-10-CM

## 2015-11-22 MED ORDER — AMPHETAMINE-DEXTROAMPHETAMINE 15 MG PO TABS
15.0000 mg | ORAL_TABLET | Freq: Two times a day (BID) | ORAL | Status: DC
Start: 2015-11-22 — End: 2016-01-23

## 2015-11-22 MED ORDER — AMPHETAMINE-DEXTROAMPHETAMINE 15 MG PO TABS: 15.0000 mg | ORAL_TABLET | Freq: Two times a day (BID) | ORAL | Status: DC

## 2015-11-22 MED ORDER — ALPRAZOLAM 1 MG PO TABS: 1.0000 mg | ORAL_TABLET | Freq: Three times a day (TID) | ORAL | Status: DC

## 2015-11-22 MED ORDER — TRAZODONE HCL 100 MG PO TABS: 100.0000 mg | ORAL_TABLET | Freq: Every day | ORAL | Status: DC
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
## 2015-11-23 ENCOUNTER — Encounter (HOSPITAL_COMMUNITY): Payer: Self-pay | Admitting: Psychiatry

## 2015-11-23 ENCOUNTER — Ambulatory Visit (INDEPENDENT_AMBULATORY_CARE_PROVIDER_SITE_OTHER): Payer: Medicare HMO | Admitting: Psychiatry

## 2015-11-23 DIAGNOSIS — F3162 Bipolar disorder, current episode mixed, moderate: Secondary | ICD-10-CM

## 2015-11-23 NOTE — Patient Instructions (Signed)
Discussed orally 
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
## 2015-12-01 ENCOUNTER — Telehealth (HOSPITAL_COMMUNITY): Payer: Self-pay | Admitting: *Deleted

## 2015-12-01 NOTE — Telephone Encounter (Signed)
phone call from patient, stated the Trazadone 100 mg. is only lasting about four and one half hour.   she is going on vacation and she need to sleep.   It is not putting her to sleep.

## 2015-12-01 NOTE — Telephone Encounter (Signed)
She can try taking one and a half or 2

## 2015-12-01 NOTE — Telephone Encounter (Signed)
Pt called stating that her Trazodone is not working. Per pt, Trazodone is only lasting for 4.5 hours and is going on a vacation soon and need to sleep. Pt would like to know what to do. Pt number is [phone]

## 2015-12-02 NOTE — Telephone Encounter (Signed)
Called pt and informed her with what provider stated. Pt verbalized understanding. Informed pt to finish the medications with the new directions and before she runs out to call office. Pt verbalized understanding.

## 2015-12-07 ENCOUNTER — Ambulatory Visit (HOSPITAL_COMMUNITY): Payer: Self-pay | Admitting: Psychiatry

## 2015-12-08 ENCOUNTER — Telehealth (HOSPITAL_COMMUNITY): Payer: Self-pay | Admitting: *Deleted

## 2015-12-08 NOTE — Telephone Encounter (Signed)
phone call from patient, said Trazodone was increased.  she has been constipated for about 8 days, her PC think it may be the Trazodone.   She said she is going back on the Seroquel until she hears from Dr. Harrington Challenger.  Her goal is to get off the Seroquel.

## 2015-12-09 NOTE — Telephone Encounter (Signed)
Ok she can stay on seroquel for now

## 2015-12-12 NOTE — Telephone Encounter (Signed)
FYI:  Called pt due to previous call. Per pt she have not actually went back to Seroquel. Per pt, she is having a manic up face and she will just enjoy it while it last because she's cleaning and feel uplefted. Per pt, she do not want to try the Seroquel because it makes her sleepy the next day and her mood is off. Per pt, with school about to start, she's just going to try the trazodone again.

## 2015-12-12 NOTE — Telephone Encounter (Signed)
ok 

## 2015-12-12 NOTE — Telephone Encounter (Signed)
lmtcb number provided 

## 2016-01-02 ENCOUNTER — Ambulatory Visit (INDEPENDENT_AMBULATORY_CARE_PROVIDER_SITE_OTHER): Payer: Medicare HMO | Admitting: Psychiatry

## 2016-01-02 ENCOUNTER — Encounter (HOSPITAL_COMMUNITY): Payer: Self-pay | Admitting: Psychiatry

## 2016-01-02 ENCOUNTER — Telehealth (HOSPITAL_COMMUNITY): Payer: Self-pay | Admitting: *Deleted

## 2016-01-02 VITALS — BP 119/66 | HR 110 | Ht 64.0 in | Wt 112.0 lb

## 2016-01-02 DIAGNOSIS — F3162 Bipolar disorder, current episode mixed, moderate: Secondary | ICD-10-CM

## 2016-01-02 MED ORDER — DIAZEPAM 10 MG PO TABS: 10.0000 mg | ORAL_TABLET | Freq: Four times a day (QID) | ORAL | 2 refills | Status: DC

## 2016-01-02 MED ORDER — ZOLPIDEM TARTRATE 10 MG PO TABS: 10.0000 mg | ORAL_TABLET | Freq: Every evening | ORAL | 2 refills | Status: DC | PRN

## 2016-01-02 NOTE — Telephone Encounter (Signed)
Pt called stating she is not doing well. Per pt, she is not taking her Bi-polar meds due to her and provider deciding to get off Seroquel. Per pt, it making her sleepy. Per pt she is not sleeping well either. Waking up with a lot of anxiety, aggressive words towards everyone. Per pt, she is at edge and yesterday it was so bad until she went back on the Valium. Per pt, Valium is helping just a little with her Anxiety. Pt then stated she can not stand herself, full with anger, jittery, Very irritable and a lot of burst of frustration. Per pt, her next appt is not until 18th of September. Per pt, she just need to be on a mood stabilizer. Per pt, what ever that is in the trazadone, it did help with her Depression. Per pt, she need something to mellow her out so she can stop this anger and she can not explain why she it feeling this way. Pt was scheduled for today to f/u with provider.

## 2016-01-02 NOTE — Telephone Encounter (Signed)
noted 
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
## 2016-01-03 ENCOUNTER — Telehealth (HOSPITAL_COMMUNITY): Payer: Self-pay | Admitting: *Deleted

## 2016-01-03 NOTE — Telephone Encounter (Signed)
Pt called stating she would like to let Dr. Harrington Challenger know she will no longer be taking her Vraylar. Per pt, she took medication last night and had Diarrhea like vomit. She felt horrible. Asked pt if she eat and she stated yes. Per pt, she went to a [place] and she and her daughter shared a plate and knows it's not food poison. Asked pt if she thinks it might be food poison? Per pt she didn't get her normal acid vomit like she does when she's sick that's why she knows it's not food poison. Per pt, she wants this medication to be put on her allergy list because she is not taking it again. Per pt, does Dr. Harrington Challenger have any other mood stabilizer she would like to try? Pt number is [phone].

## 2016-01-03 NOTE — Telephone Encounter (Signed)
She seemed to do best on Seroquel if she wants to restart it. Or we can try zyprexa at a low dose

## 2016-01-04 ENCOUNTER — Other Ambulatory Visit (HOSPITAL_COMMUNITY): Payer: Self-pay | Admitting: Psychiatry

## 2016-01-04 MED ORDER — OLANZAPINE 2.5 MG PO TABS: 2.5000 mg | ORAL_TABLET | Freq: Every day | ORAL | 2 refills | Status: DC

## 2016-01-04 NOTE — Telephone Encounter (Signed)
zyprexa 2.5 mg sent to pharmacy, this is the lowest dose

## 2016-01-04 NOTE — Telephone Encounter (Signed)
lmtcb office number provided

## 2016-01-04 NOTE — Telephone Encounter (Signed)
Called pt to inform her of what Dr. Harrington Challenger stated. Per pt, she is trying not to do Seroquel due to it making her really sleepy the next day. Per pt, she would like to try the Zyprexa and would like to know what strength? Per pt she have a really sensitive stomach. Per pt, she would like to have Seroquel as a last resort. Pt number is [phone].

## 2016-01-05 NOTE — Telephone Encounter (Signed)
Pt is aware and stated she will start Zyprexa 2.5 mg tomorrow 01-06-16.

## 2016-01-23 ENCOUNTER — Encounter (HOSPITAL_COMMUNITY): Payer: Self-pay | Admitting: Psychiatry

## 2016-01-23 ENCOUNTER — Ambulatory Visit (INDEPENDENT_AMBULATORY_CARE_PROVIDER_SITE_OTHER): Payer: Medicare HMO | Admitting: Psychiatry

## 2016-01-23 VITALS — BP 91/50 | HR 86 | Ht 64.0 in | Wt 107.2 lb

## 2016-01-23 DIAGNOSIS — F3162 Bipolar disorder, current episode mixed, moderate: Secondary | ICD-10-CM

## 2016-01-23 DIAGNOSIS — F411 Generalized anxiety disorder: Secondary | ICD-10-CM | POA: Diagnosis not present

## 2016-01-23 MED ORDER — AMPHETAMINE-DEXTROAMPHETAMINE 15 MG PO TABS: 15.0000 mg | ORAL_TABLET | Freq: Two times a day (BID) | ORAL | 0 refills | Status: DC

## 2016-01-23 MED ORDER — OLANZAPINE 5 MG PO TABS: 5.0000 mg | ORAL_TABLET | Freq: Every day | ORAL | 2 refills | Status: DC
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
## 2016-02-20 ENCOUNTER — Ambulatory Visit (HOSPITAL_COMMUNITY): Payer: Self-pay | Admitting: Psychiatry

## 2016-02-21 ENCOUNTER — Ambulatory Visit (INDEPENDENT_AMBULATORY_CARE_PROVIDER_SITE_OTHER): Payer: Medicare HMO | Admitting: Psychiatry

## 2016-02-21 ENCOUNTER — Encounter (HOSPITAL_COMMUNITY): Payer: Self-pay | Admitting: Psychiatry

## 2016-02-21 VITALS — BP 87/63 | HR 95 | Ht 64.0 in | Wt 109.4 lb

## 2016-02-21 DIAGNOSIS — Z818 Family history of other mental and behavioral disorders: Secondary | ICD-10-CM

## 2016-02-21 DIAGNOSIS — F3162 Bipolar disorder, current episode mixed, moderate: Secondary | ICD-10-CM | POA: Diagnosis not present

## 2016-02-21 DIAGNOSIS — F411 Generalized anxiety disorder: Secondary | ICD-10-CM

## 2016-02-21 MED ORDER — OLANZAPINE 5 MG PO TABS: ORAL_TABLET | ORAL | 2 refills | Status: DC

## 2016-02-21 MED ORDER — AMPHETAMINE-DEXTROAMPHETAMINE 15 MG PO TABS: 15.0000 mg | ORAL_TABLET | Freq: Two times a day (BID) | ORAL | 0 refills | Status: DC

## 2016-02-21 MED ORDER — ALPRAZOLAM 1 MG PO TABS: 1.0000 mg | ORAL_TABLET | Freq: Three times a day (TID) | ORAL | 2 refills | Status: DC
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
## 2016-02-23 ENCOUNTER — Telehealth (HOSPITAL_COMMUNITY): Payer: Self-pay | Admitting: *Deleted

## 2016-02-23 NOTE — Telephone Encounter (Signed)
Prior authorization for Olanzapine received. Submitted online with cover my meds.

## 2016-03-14 ENCOUNTER — Other Ambulatory Visit (HOSPITAL_COMMUNITY): Payer: Self-pay | Admitting: Psychiatry

## 2016-03-14 ENCOUNTER — Telehealth (HOSPITAL_COMMUNITY): Payer: Self-pay | Admitting: *Deleted

## 2016-03-14 MED ORDER — OLANZAPINE 5 MG PO TABS: ORAL_TABLET | ORAL | 2 refills | Status: DC

## 2016-03-14 NOTE — Telephone Encounter (Signed)
sent 

## 2016-03-14 NOTE — Telephone Encounter (Signed)
Pt pharmacy [facility] in [place] requesting 90 day supply for pt Olanzapine 5 mg 2 tabs QHS. Pt medication last filled on 02-21-2016 with 60 tabs 2 refills. Pharmacy number is [phone].

## 2016-03-15 NOTE — Telephone Encounter (Signed)
noted 

## 2016-03-22 ENCOUNTER — Ambulatory Visit (INDEPENDENT_AMBULATORY_CARE_PROVIDER_SITE_OTHER): Payer: Medicare HMO | Admitting: Psychiatry

## 2016-03-22 ENCOUNTER — Encounter (HOSPITAL_COMMUNITY): Payer: Self-pay | Admitting: Psychiatry

## 2016-03-22 VITALS — BP 85/63 | Ht 67.0 in | Wt 110.0 lb

## 2016-03-22 DIAGNOSIS — F3162 Bipolar disorder, current episode mixed, moderate: Secondary | ICD-10-CM

## 2016-03-22 DIAGNOSIS — Z818 Family history of other mental and behavioral disorders: Secondary | ICD-10-CM | POA: Diagnosis not present

## 2016-03-22 DIAGNOSIS — Z79899 Other long term (current) drug therapy: Secondary | ICD-10-CM | POA: Diagnosis not present

## 2016-03-22 MED ORDER — AMPHETAMINE-DEXTROAMPHETAMINE 15 MG PO TABS: 15.0000 mg | ORAL_TABLET | Freq: Two times a day (BID) | ORAL | 0 refills | Status: DC

## 2016-03-22 MED ORDER — DULOXETINE HCL 30 MG PO CPEP: 30.0000 mg | ORAL_CAPSULE | Freq: Every day | ORAL | 2 refills | Status: DC
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
## 2016-04-18 ENCOUNTER — Telehealth (HOSPITAL_COMMUNITY): Payer: Self-pay | Admitting: *Deleted

## 2016-04-18 ENCOUNTER — Encounter (HOSPITAL_COMMUNITY): Payer: Self-pay | Admitting: Psychiatry

## 2016-04-18 ENCOUNTER — Ambulatory Visit (INDEPENDENT_AMBULATORY_CARE_PROVIDER_SITE_OTHER): Payer: Medicare HMO | Admitting: Psychiatry

## 2016-04-18 VITALS — BP 96/66 | HR 78 | Ht 67.0 in | Wt 110.4 lb

## 2016-04-18 DIAGNOSIS — Z79899 Other long term (current) drug therapy: Secondary | ICD-10-CM

## 2016-04-18 DIAGNOSIS — F3162 Bipolar disorder, current episode mixed, moderate: Secondary | ICD-10-CM

## 2016-04-18 DIAGNOSIS — Z818 Family history of other mental and behavioral disorders: Secondary | ICD-10-CM | POA: Diagnosis not present

## 2016-04-18 MED ORDER — OLANZAPINE 5 MG PO TABS: ORAL_TABLET | ORAL | 2 refills | Status: DC

## 2016-04-18 MED ORDER — AMPHETAMINE-DEXTROAMPHETAMINE 15 MG PO TABS: 15.0000 mg | ORAL_TABLET | Freq: Two times a day (BID) | ORAL | 0 refills | Status: DC

## 2016-04-18 MED ORDER — ZOLPIDEM TARTRATE ER 12.5 MG PO TBCR: 12.5000 mg | EXTENDED_RELEASE_TABLET | Freq: Every evening | ORAL | 2 refills | Status: DC | PRN

## 2016-04-18 MED ORDER — ALPRAZOLAM 1 MG PO TABS: 1.0000 mg | ORAL_TABLET | Freq: Three times a day (TID) | ORAL | 2 refills | Status: DC

## 2016-04-18 NOTE — Telephone Encounter (Signed)
Called pt insurance [employer] to get Prior Auth for medication management and therapy. Samantha Chambers was approved from now until 10-13-2015 for both and the same auth number. Spoke with Cletus Gash. 15 visit for med manag. and 20 for therapy.
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
## 2016-04-24 ENCOUNTER — Telehealth (HOSPITAL_COMMUNITY): Payer: Self-pay | Admitting: *Deleted

## 2016-04-24 NOTE — Telephone Encounter (Signed)
patient returned phone call to [facility].

## 2016-04-25 ENCOUNTER — Telehealth (HOSPITAL_COMMUNITY): Payer: Self-pay | Admitting: *Deleted

## 2016-04-25 NOTE — Telephone Encounter (Signed)
Called pt and informed her that she was approved for med management visits and therapy visits until 10-22-2016. Pt verbalized understanding.

## 2016-04-25 NOTE — Telephone Encounter (Signed)
noted 

## 2016-04-25 NOTE — Telephone Encounter (Signed)
Received denial for 90 supply request for Olanzapine from [facility]. Called pharmacy to ask if they still needed 90 day supply and was told that patient had medication filled 12/13 and it was approved for the 30 day supply. They state that it's not necessary to get a 90 day supply since 30 days is covered and the 90 was denied.

## 2016-04-25 NOTE — Telephone Encounter (Signed)
Prior authorization for Olanzapine received. Called Medicaid was told it must go through Medicare. Called pharmacy to get correct number and ID for patient. Called [phone] spoke with Vassie Loll who states it was approved in October for a 30 day supply but new auth needed for a 90 day supply. Submitted and decision will be faxed. Reference [id number]

## 2016-04-26 NOTE — Telephone Encounter (Signed)
Called pt and informed her with what Samantha Chambers stated and she verbalized understanding

## 2016-04-26 NOTE — Telephone Encounter (Signed)
Noted. Will inform pt with information

## 2016-05-29 ENCOUNTER — Telehealth (HOSPITAL_COMMUNITY): Payer: Self-pay | Admitting: *Deleted

## 2016-05-29 NOTE — Telephone Encounter (Signed)
voice message from pharmacy.  please call them regarding two meds taken together, need to confirm.

## 2016-05-30 NOTE — Telephone Encounter (Signed)
Called pt pharmacy back and spoke with [place] the pharmacist. Per [place], she was not working yesterday and do not know what the message was about and to disregard the message.

## 2016-06-08 ENCOUNTER — Telehealth (HOSPITAL_COMMUNITY): Payer: Self-pay | Admitting: *Deleted

## 2016-06-08 NOTE — Telephone Encounter (Signed)
Office receieved approval from [facility] for pt Olanzapine 5 mg. Approval is good for 180-90 until 04-26-2017.

## 2016-06-21 ENCOUNTER — Encounter (HOSPITAL_COMMUNITY): Payer: Self-pay | Admitting: Psychiatry

## 2016-06-21 ENCOUNTER — Ambulatory Visit (INDEPENDENT_AMBULATORY_CARE_PROVIDER_SITE_OTHER): Payer: Medicare HMO | Admitting: Psychiatry

## 2016-06-21 VITALS — BP 101/59 | HR 65 | Ht 67.0 in | Wt 113.2 lb

## 2016-06-21 DIAGNOSIS — F3162 Bipolar disorder, current episode mixed, moderate: Secondary | ICD-10-CM

## 2016-06-21 DIAGNOSIS — Z818 Family history of other mental and behavioral disorders: Secondary | ICD-10-CM | POA: Diagnosis not present

## 2016-06-21 DIAGNOSIS — Z882 Allergy status to sulfonamides status: Secondary | ICD-10-CM | POA: Diagnosis not present

## 2016-06-21 DIAGNOSIS — Z79899 Other long term (current) drug therapy: Secondary | ICD-10-CM

## 2016-06-21 DIAGNOSIS — Z888 Allergy status to other drugs, medicaments and biological substances status: Secondary | ICD-10-CM | POA: Diagnosis not present

## 2016-06-21 DIAGNOSIS — F411 Generalized anxiety disorder: Secondary | ICD-10-CM

## 2016-06-21 MED ORDER — OLANZAPINE 5 MG PO TABS: 10.0000 mg | ORAL_TABLET | Freq: Every day | ORAL | 2 refills | Status: DC

## 2016-06-21 MED ORDER — AMPHETAMINE-DEXTROAMPHETAMINE 15 MG PO TABS: 15.0000 mg | ORAL_TABLET | Freq: Two times a day (BID) | ORAL | 0 refills | Status: DC

## 2016-06-21 MED ORDER — AMITRIPTYLINE HCL 10 MG PO TABS: ORAL_TABLET | ORAL | 2 refills | Status: DC
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
## 2016-07-20 ENCOUNTER — Ambulatory Visit (INDEPENDENT_AMBULATORY_CARE_PROVIDER_SITE_OTHER): Payer: Medicare HMO | Admitting: Psychiatry

## 2016-07-20 ENCOUNTER — Encounter (HOSPITAL_COMMUNITY): Payer: Self-pay | Admitting: Psychiatry

## 2016-07-20 VITALS — BP 96/58 | HR 85 | Ht 63.0 in | Wt 115.0 lb

## 2016-07-20 DIAGNOSIS — Z888 Allergy status to other drugs, medicaments and biological substances status: Secondary | ICD-10-CM

## 2016-07-20 DIAGNOSIS — F3162 Bipolar disorder, current episode mixed, moderate: Secondary | ICD-10-CM | POA: Diagnosis not present

## 2016-07-20 DIAGNOSIS — Z79899 Other long term (current) drug therapy: Secondary | ICD-10-CM | POA: Diagnosis not present

## 2016-07-20 DIAGNOSIS — Z882 Allergy status to sulfonamides status: Secondary | ICD-10-CM | POA: Diagnosis not present

## 2016-07-20 DIAGNOSIS — F411 Generalized anxiety disorder: Secondary | ICD-10-CM

## 2016-07-20 DIAGNOSIS — Z818 Family history of other mental and behavioral disorders: Secondary | ICD-10-CM | POA: Diagnosis not present

## 2016-07-20 MED ORDER — AMITRIPTYLINE HCL 10 MG PO TABS: ORAL_TABLET | ORAL | 2 refills | Status: DC

## 2016-07-20 MED ORDER — OLANZAPINE 5 MG PO TABS: 10.0000 mg | ORAL_TABLET | Freq: Every day | ORAL | 2 refills | Status: DC

## 2016-07-20 MED ORDER — AMPHETAMINE-DEXTROAMPHETAMINE 15 MG PO TABS: 15.0000 mg | ORAL_TABLET | Freq: Two times a day (BID) | ORAL | 0 refills | Status: DC

## 2016-07-20 MED ORDER — AMPHETAMINE-DEXTROAMPHETAMINE 15 MG PO TABS
15.0000 mg | ORAL_TABLET | Freq: Two times a day (BID) | ORAL | 0 refills | Status: DC
Start: 2016-07-20 — End: 2016-09-19

## 2016-07-20 MED ORDER — ALPRAZOLAM 1 MG PO TABS: 1.0000 mg | ORAL_TABLET | Freq: Three times a day (TID) | ORAL | 2 refills | Status: DC
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
## 2016-08-14 ENCOUNTER — Ambulatory Visit (INDEPENDENT_AMBULATORY_CARE_PROVIDER_SITE_OTHER): Payer: Medicare HMO | Admitting: Sports Medicine

## 2016-08-14 ENCOUNTER — Encounter: Payer: Self-pay | Admitting: Sports Medicine

## 2016-08-14 DIAGNOSIS — M79675 Pain in left toe(s): Secondary | ICD-10-CM | POA: Diagnosis not present

## 2016-08-14 DIAGNOSIS — M79674 Pain in right toe(s): Secondary | ICD-10-CM

## 2016-08-14 DIAGNOSIS — L600 Ingrowing nail: Secondary | ICD-10-CM

## 2016-08-14 DIAGNOSIS — L84 Corns and callosities: Secondary | ICD-10-CM | POA: Diagnosis not present

## 2016-08-14 DIAGNOSIS — M2042 Other hammer toe(s) (acquired), left foot: Secondary | ICD-10-CM | POA: Diagnosis not present

## 2016-08-14 NOTE — Progress Notes (Signed)
   Subjective:    Patient ID: Samantha Chambers, female    DOB: 12-Nov-1970, 46 y.o.   MRN: [id number]  HPI  Chief Complaint  Patient presents with  . Callouses    Left; 5th toe; medial side; painful.   . Nail Problem    Right; Great Toe; Medial Side; Painful x "for years off and on"        Review of Systems     Objective:   Physical Exam        Assessment & Plan:

## 2016-08-14 NOTE — Progress Notes (Signed)
Subjective: Samantha Chambers is a 46 y.o. female patient who presents to office for evaluation of Left foot pain secondary to callus skin in between toes and pain at right 1st toenail at corner. Patient complains of pain at the lesion present at 4-5 toes on left; went to dermatologist who recommended a skin cream but since the callus builds up and hurts. Patient also has tried trimming right 1st toenail with some relief especially when she goes for pedicures however states that the nail grows back out in the corner. Patient denies any other pedal complaints.   Patient Active Problem List   Diagnosis Date Noted  . Bipolar 1 disorder, mixed, moderate ([place]) 02/13/2013  . Generalized anxiety disorder 02/13/2013    Current Outpatient Prescriptions on File Prior to Visit  Medication Sig Dispense Refill  . ALPRAZolam (XANAX) 1 MG tablet Take 1 tablet (1 mg total) by mouth 3 (three) times daily. 90 tablet 2  . amitriptyline (ELAVIL) 10 MG tablet Take one or two at bedtime 60 tablet 2  . amphetamine-dextroamphetamine (ADDERALL) 15 MG tablet Take 1 tablet by mouth 2 (two) times daily. 60 tablet 0  . OLANZapine (ZYPREXA) 5 MG tablet Take 2 tablets (10 mg total) by mouth at bedtime. 180 tablet 2  . ORSYTHIA 0.1-20 MG-MCG tablet     . amphetamine-dextroamphetamine (ADDERALL) 15 MG tablet Take 1 tablet by mouth 2 (two) times daily. Fill after 08/20/16 (Patient not taking: Reported on 08/14/2016) 60 tablet 0  . zolpidem (AMBIEN CR) 12.5 MG CR tablet Take 1 tablet (12.5 mg total) by mouth at bedtime as needed for sleep. (Patient not taking: Reported on 07/20/2016) 30 tablet 2   No current facility-administered medications on file prior to visit.     Allergies  Allergen Reactions  . Clonazepam Nausea And Vomiting  . Sulfa Antibiotics   . Vilazodone     Per Pt med made her sick    Objective:  General: Alert and oriented x3 in no acute distress  Dermatology: Keratotic lesion present 4th webspace on left  with skin lines transversing the lesion, pain is present with direct pressure to the lesion with a central nucleated core noted, no webspace macerations, no ecchymosis bilateral, all nails x 10 are well manicured however distally there is mild ingrowing at right hallux nail lateral margin with no acute signs of infection.  Vascular: Dorsalis Pedis and Posterior Tibial pedal pulses 2/4, Capillary Fill Time 3 seconds, + pedal hair growth bilateral, no edema bilateral lower extremities, Temperature gradient within normal limits.  Neurology: Johney Maine sensation intact via light touch bilateral.  Musculoskeletal: Mild tenderness with palpation at the keratotic lesion site on left and at right hallux nail. + varus rotated 5th hammertoe on left. Muscular strength 5/5 in all groups without pain or limitation on range of motion.   Assessment and Plan: Problem List Items Addressed This Visit    None    Visit Diagnoses    Corns and callosities    -  Primary   4th webspace on left   Hammer toe of left foot       varus 5th toe   Ingrown nail       right hallux lateral margin    Toe pain, bilateral          -Complete examination performed -Discussed treatment options -Parred keratoic lesion using a chisel blade at 4th webspace on left -Encouraged daily exfolliating and skin emollients -Encouraged use of pumice stone -Gave toe spacer and instructed  on use for left 4th webspace -Advised patient may benefit from hammertoe surgery if no improvement -Recommended PNA for right hallux however patient declined procedure in office today so I trimmed the corner of the nail today and advised patient that if recurs then will benefit from PNA. -Advised good supportive shoes and inserts -Patient to return to office as needed or sooner if condition worsens.  Landis Martins, DPM

## 2016-08-28 ENCOUNTER — Telehealth (HOSPITAL_COMMUNITY): Payer: Self-pay | Admitting: *Deleted

## 2016-08-28 ENCOUNTER — Other Ambulatory Visit (HOSPITAL_COMMUNITY): Payer: Self-pay | Admitting: Psychiatry

## 2016-08-28 MED ORDER — AMITRIPTYLINE HCL 25 MG PO TABS: 25.0000 mg | ORAL_TABLET | Freq: Every day | ORAL | 2 refills | Status: DC

## 2016-08-28 NOTE — Telephone Encounter (Signed)
Pt called stating she have been in a severe stated of depression for about a week. Per pt she would like to see if Dr. Harrington Challenger could increase her Amitriptyline. Pt number is [phone].

## 2016-08-28 NOTE — Telephone Encounter (Signed)
Called Samantha Chambers and informed her Dr. Harrington Challenger sent in 25 mg of the medications she inquired about during last message. Samantha Chambers verbalized understanding.

## 2016-08-28 NOTE — Telephone Encounter (Signed)
Amitriptyline 25 mg sent in

## 2016-09-19 ENCOUNTER — Encounter (HOSPITAL_COMMUNITY): Payer: Self-pay | Admitting: Psychiatry

## 2016-09-19 ENCOUNTER — Ambulatory Visit (INDEPENDENT_AMBULATORY_CARE_PROVIDER_SITE_OTHER): Payer: Medicare HMO | Admitting: Psychiatry

## 2016-09-19 VITALS — BP 86/53 | HR 88 | Ht 63.0 in | Wt 119.0 lb

## 2016-09-19 DIAGNOSIS — Z818 Family history of other mental and behavioral disorders: Secondary | ICD-10-CM

## 2016-09-19 DIAGNOSIS — F3162 Bipolar disorder, current episode mixed, moderate: Secondary | ICD-10-CM

## 2016-09-19 DIAGNOSIS — Z79899 Other long term (current) drug therapy: Secondary | ICD-10-CM

## 2016-09-19 DIAGNOSIS — F411 Generalized anxiety disorder: Secondary | ICD-10-CM

## 2016-09-19 MED ORDER — OLANZAPINE 5 MG PO TABS: 10.0000 mg | ORAL_TABLET | Freq: Every day | ORAL | 2 refills | Status: DC

## 2016-09-19 MED ORDER — AMPHETAMINE-DEXTROAMPHETAMINE 15 MG PO TABS: 15.0000 mg | ORAL_TABLET | Freq: Two times a day (BID) | ORAL | 0 refills | Status: DC

## 2016-09-19 MED ORDER — AMITRIPTYLINE HCL 25 MG PO TABS: 25.0000 mg | ORAL_TABLET | Freq: Every day | ORAL | 2 refills | Status: DC

## 2016-09-19 MED ORDER — ALPRAZOLAM 1 MG PO TABS: 1.0000 mg | ORAL_TABLET | Freq: Three times a day (TID) | ORAL | 2 refills | Status: DC
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
## 2016-10-05 ENCOUNTER — Other Ambulatory Visit (HOSPITAL_COMMUNITY): Payer: Self-pay | Admitting: Psychiatry

## 2016-10-05 ENCOUNTER — Telehealth: Payer: Self-pay

## 2016-10-05 MED ORDER — AMITRIPTYLINE HCL 25 MG PO TABS: 25.0000 mg | ORAL_TABLET | Freq: Every day | ORAL | 2 refills | Status: DC

## 2016-10-05 NOTE — Telephone Encounter (Signed)
I sent it in 

## 2016-10-05 NOTE — Telephone Encounter (Signed)
received a fax requesting a 90 day supply of the amitriptyline pt was last seen on  09-19-16 next appt 11-21-16

## 2016-11-21 ENCOUNTER — Encounter (HOSPITAL_COMMUNITY): Payer: Self-pay | Admitting: Psychiatry

## 2016-11-21 ENCOUNTER — Ambulatory Visit (INDEPENDENT_AMBULATORY_CARE_PROVIDER_SITE_OTHER): Payer: Medicare HMO | Admitting: Psychiatry

## 2016-11-21 VITALS — BP 98/60 | HR 83 | Ht 63.0 in

## 2016-11-21 DIAGNOSIS — F411 Generalized anxiety disorder: Secondary | ICD-10-CM | POA: Diagnosis not present

## 2016-11-21 DIAGNOSIS — F3162 Bipolar disorder, current episode mixed, moderate: Secondary | ICD-10-CM

## 2016-11-21 DIAGNOSIS — Z818 Family history of other mental and behavioral disorders: Secondary | ICD-10-CM

## 2016-11-21 MED ORDER — AMITRIPTYLINE HCL 50 MG PO TABS: 50.0000 mg | ORAL_TABLET | Freq: Every day | ORAL | 2 refills | Status: DC

## 2016-11-21 MED ORDER — AMPHETAMINE-DEXTROAMPHETAMINE 15 MG PO TABS: ORAL_TABLET | ORAL | 0 refills | Status: DC

## 2016-11-21 MED ORDER — ALPRAZOLAM 1 MG PO TABS: 1.0000 mg | ORAL_TABLET | Freq: Three times a day (TID) | ORAL | 2 refills | Status: DC

## 2016-11-21 MED ORDER — OLANZAPINE 5 MG PO TABS: 5.0000 mg | ORAL_TABLET | Freq: Every day | ORAL | 2 refills | Status: DC
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
## 2016-12-18 ENCOUNTER — Encounter (HOSPITAL_COMMUNITY): Payer: Self-pay | Admitting: Psychiatry

## 2016-12-18 ENCOUNTER — Ambulatory Visit (INDEPENDENT_AMBULATORY_CARE_PROVIDER_SITE_OTHER): Payer: Medicare HMO | Admitting: Psychiatry

## 2016-12-18 VITALS — BP 94/56 | HR 96 | Ht 63.0 in | Wt 124.8 lb

## 2016-12-18 DIAGNOSIS — F3160 Bipolar disorder, current episode mixed, unspecified: Secondary | ICD-10-CM

## 2016-12-18 DIAGNOSIS — Z79899 Other long term (current) drug therapy: Secondary | ICD-10-CM | POA: Diagnosis not present

## 2016-12-18 DIAGNOSIS — F3162 Bipolar disorder, current episode mixed, moderate: Secondary | ICD-10-CM

## 2016-12-18 DIAGNOSIS — F411 Generalized anxiety disorder: Secondary | ICD-10-CM

## 2016-12-18 DIAGNOSIS — Z818 Family history of other mental and behavioral disorders: Secondary | ICD-10-CM

## 2016-12-18 DIAGNOSIS — Z915 Personal history of self-harm: Secondary | ICD-10-CM

## 2016-12-18 DIAGNOSIS — F988 Other specified behavioral and emotional disorders with onset usually occurring in childhood and adolescence: Secondary | ICD-10-CM | POA: Diagnosis not present

## 2016-12-18 DIAGNOSIS — Z881 Allergy status to other antibiotic agents status: Secondary | ICD-10-CM | POA: Diagnosis not present

## 2016-12-18 DIAGNOSIS — Z888 Allergy status to other drugs, medicaments and biological substances status: Secondary | ICD-10-CM

## 2016-12-18 MED ORDER — AMPHETAMINE-DEXTROAMPHETAMINE 15 MG PO TABS: ORAL_TABLET | ORAL | 0 refills | Status: DC
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
## 2017-01-30 ENCOUNTER — Telehealth (HOSPITAL_COMMUNITY): Payer: Self-pay | Admitting: *Deleted

## 2017-01-30 MED ORDER — ALPRAZOLAM 1 MG PO TABS
1.0000 mg | ORAL_TABLET | Freq: Three times a day (TID) | ORAL | 0 refills | Status: DC
Start: 2017-01-30 — End: 2017-03-04

## 2017-01-30 NOTE — Telephone Encounter (Signed)
Pt called stating she thinks she lost her Xanax script. Per pt, she thinks she through it away when she was throwing her AVS away. Per pt she tries to not have her AVS in her house because she do not want her kids/family knowing which meds she's on. Per pt, she called her pharmacy to see if she might have gived it to them and they stated they do not have her 11-21-2016 Xanax script. Per pt chart, Xanax was last printed on 11-21-2016 with 90 tabs 2 refills. Per pt chart, pt f/u appt is 02-12-2017. Per pt her Adderall script is fine. Per pt she fills her Adderall with [facility] and [facility] [place]. Per pt, she don't take the Xanax on the regular and now she's out and don't have anymore. Pt number is [phone]. Per pt she looked everywhere and still can not find it.

## 2017-01-30 NOTE — Telephone Encounter (Signed)
You may call in enough to last until appt 

## 2017-01-30 NOTE — Telephone Encounter (Signed)
Per provider to call in pt Xanax to last her until her next appt with her. Pt next f/u appt is 02-12-2017. Called pt pharmacy and informed them that provider would like to only fill 39 tabs of pt Xanax. Spoke with Dorothea Ogle at the pharmacy. Called pt and informed her with information and she verbalized understanding.

## 2017-02-07 ENCOUNTER — Telehealth (HOSPITAL_COMMUNITY): Payer: Self-pay | Admitting: *Deleted

## 2017-02-07 NOTE — Telephone Encounter (Signed)
Office received fax from pt pharmacy [facility] [place] requesting 90 days supply for pt Samantha Chambers 50 mg QHS. Per pt chart, pt medication was last filled on 11-21-2016 with 30 tabs 2 refills. Pt f/u appt with Dr. Harrington Challenger is 02-12-2017. Pt pharmacy number is [phone].

## 2017-02-07 NOTE — Telephone Encounter (Signed)
Will not order at this time and will defer to Dr. Harrington Challenger at her next visit.

## 2017-02-09 ENCOUNTER — Telehealth (HOSPITAL_COMMUNITY): Payer: Self-pay | Admitting: *Deleted

## 2017-02-09 NOTE — Telephone Encounter (Signed)
Pt pharmacy requesting 90 days supply for pt Amitriptyline HCL 50 mg QHS. Per pt chart, pt med was last filled on 11-21-2016 with 30 tabs 2 refills. Pt pharmacy number is [phone].

## 2017-02-11 ENCOUNTER — Other Ambulatory Visit (HOSPITAL_COMMUNITY): Payer: Self-pay | Admitting: Psychiatry

## 2017-02-11 MED ORDER — AMITRIPTYLINE HCL 50 MG PO TABS: 50.0000 mg | ORAL_TABLET | Freq: Every day | ORAL | 2 refills | Status: DC

## 2017-02-11 NOTE — Telephone Encounter (Signed)
sent 

## 2017-02-11 NOTE — Telephone Encounter (Signed)
Noted  

## 2017-02-11 NOTE — Telephone Encounter (Signed)
noted 

## 2017-02-12 ENCOUNTER — Ambulatory Visit (HOSPITAL_COMMUNITY): Payer: Self-pay | Admitting: Psychiatry

## 2017-02-14 ENCOUNTER — Ambulatory Visit (HOSPITAL_COMMUNITY): Payer: Medicare HMO | Admitting: Psychiatry

## 2017-03-04 ENCOUNTER — Ambulatory Visit (INDEPENDENT_AMBULATORY_CARE_PROVIDER_SITE_OTHER): Payer: Medicare HMO | Admitting: Psychiatry

## 2017-03-04 ENCOUNTER — Encounter (HOSPITAL_COMMUNITY): Payer: Self-pay | Admitting: Psychiatry

## 2017-03-04 VITALS — BP 115/77 | HR 107 | Ht 63.0 in | Wt 124.0 lb

## 2017-03-04 DIAGNOSIS — F419 Anxiety disorder, unspecified: Secondary | ICD-10-CM

## 2017-03-04 DIAGNOSIS — F3162 Bipolar disorder, current episode mixed, moderate: Secondary | ICD-10-CM

## 2017-03-04 DIAGNOSIS — G4700 Insomnia, unspecified: Secondary | ICD-10-CM | POA: Diagnosis not present

## 2017-03-04 DIAGNOSIS — R5381 Other malaise: Secondary | ICD-10-CM

## 2017-03-04 DIAGNOSIS — R450 Nervousness: Secondary | ICD-10-CM

## 2017-03-04 DIAGNOSIS — Z818 Family history of other mental and behavioral disorders: Secondary | ICD-10-CM

## 2017-03-04 DIAGNOSIS — R5383 Other fatigue: Secondary | ICD-10-CM | POA: Diagnosis not present

## 2017-03-04 DIAGNOSIS — Z91410 Personal history of adult physical and sexual abuse: Secondary | ICD-10-CM

## 2017-03-04 DIAGNOSIS — Z62810 Personal history of physical and sexual abuse in childhood: Secondary | ICD-10-CM | POA: Diagnosis not present

## 2017-03-04 DIAGNOSIS — Z736 Limitation of activities due to disability: Secondary | ICD-10-CM | POA: Diagnosis not present

## 2017-03-04 MED ORDER — AMPHETAMINE-DEXTROAMPHETAMINE 15 MG PO TABS: ORAL_TABLET | ORAL | 0 refills | Status: DC

## 2017-03-04 MED ORDER — AMITRIPTYLINE HCL 25 MG PO TABS: 25.0000 mg | ORAL_TABLET | Freq: Every day | ORAL | 2 refills | Status: DC

## 2017-03-04 MED ORDER — ZOLPIDEM TARTRATE ER 12.5 MG PO TBCR: 12.5000 mg | EXTENDED_RELEASE_TABLET | Freq: Every evening | ORAL | 2 refills | Status: DC | PRN

## 2017-03-04 MED ORDER — AMITRIPTYLINE HCL 50 MG PO TABS: 50.0000 mg | ORAL_TABLET | Freq: Every day | ORAL | 2 refills | Status: DC

## 2017-03-04 MED ORDER — ALPRAZOLAM 1 MG PO TABS: 1.0000 mg | ORAL_TABLET | Freq: Three times a day (TID) | ORAL | 2 refills | Status: DC

## 2017-03-04 MED ORDER — OLANZAPINE 5 MG PO TABS: 5.0000 mg | ORAL_TABLET | Freq: Every day | ORAL | 2 refills | Status: DC

## 2017-03-04 NOTE — Patient Instructions (Addendum)
Increase amitryptiline to 75 mg at bedtime
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
## 2017-04-19 ENCOUNTER — Ambulatory Visit (HOSPITAL_COMMUNITY): Payer: Self-pay | Admitting: Psychiatry

## 2017-04-22 ENCOUNTER — Ambulatory Visit (HOSPITAL_COMMUNITY): Payer: Self-pay | Admitting: Psychiatry

## 2017-04-24 ENCOUNTER — Ambulatory Visit (INDEPENDENT_AMBULATORY_CARE_PROVIDER_SITE_OTHER): Payer: Medicare HMO | Admitting: Psychiatry

## 2017-04-24 ENCOUNTER — Encounter (HOSPITAL_COMMUNITY): Payer: Self-pay | Admitting: Psychiatry

## 2017-04-24 VITALS — BP 111/76 | HR 105 | Ht 63.0 in | Wt 123.0 lb

## 2017-04-24 DIAGNOSIS — F3162 Bipolar disorder, current episode mixed, moderate: Secondary | ICD-10-CM

## 2017-04-24 DIAGNOSIS — G8929 Other chronic pain: Secondary | ICD-10-CM

## 2017-04-24 DIAGNOSIS — Z91410 Personal history of adult physical and sexual abuse: Secondary | ICD-10-CM | POA: Diagnosis not present

## 2017-04-24 DIAGNOSIS — Z79899 Other long term (current) drug therapy: Secondary | ICD-10-CM

## 2017-04-24 DIAGNOSIS — F419 Anxiety disorder, unspecified: Secondary | ICD-10-CM

## 2017-04-24 DIAGNOSIS — Z62810 Personal history of physical and sexual abuse in childhood: Secondary | ICD-10-CM | POA: Diagnosis not present

## 2017-04-24 DIAGNOSIS — Z818 Family history of other mental and behavioral disorders: Secondary | ICD-10-CM

## 2017-04-24 MED ORDER — OLANZAPINE 5 MG PO TABS: 5.0000 mg | ORAL_TABLET | Freq: Every day | ORAL | 2 refills | Status: DC

## 2017-04-24 MED ORDER — AMPHETAMINE-DEXTROAMPHETAMINE 15 MG PO TABS: ORAL_TABLET | ORAL | 0 refills | Status: DC

## 2017-04-24 MED ORDER — ALPRAZOLAM 1 MG PO TABS: 1.0000 mg | ORAL_TABLET | Freq: Three times a day (TID) | ORAL | 2 refills | Status: DC

## 2017-04-24 MED ORDER — AMITRIPTYLINE HCL 75 MG PO TABS: 75.0000 mg | ORAL_TABLET | Freq: Every day | ORAL | 2 refills | Status: DC
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
## 2017-05-27 ENCOUNTER — Other Ambulatory Visit (HOSPITAL_COMMUNITY): Payer: Self-pay | Admitting: Psychiatry

## 2017-05-27 MED ORDER — AMITRIPTYLINE HCL 75 MG PO TABS: 75.0000 mg | ORAL_TABLET | Freq: Every day | ORAL | 2 refills | Status: DC

## 2017-06-25 ENCOUNTER — Encounter (HOSPITAL_COMMUNITY): Payer: Self-pay | Admitting: Psychiatry

## 2017-06-25 ENCOUNTER — Ambulatory Visit (INDEPENDENT_AMBULATORY_CARE_PROVIDER_SITE_OTHER): Payer: Medicare HMO | Admitting: Psychiatry

## 2017-06-25 VITALS — BP 102/69 | HR 96 | Ht 63.0 in | Wt 129.0 lb

## 2017-06-25 DIAGNOSIS — Z818 Family history of other mental and behavioral disorders: Secondary | ICD-10-CM | POA: Diagnosis not present

## 2017-06-25 DIAGNOSIS — Z62810 Personal history of physical and sexual abuse in childhood: Secondary | ICD-10-CM | POA: Diagnosis not present

## 2017-06-25 DIAGNOSIS — Z91410 Personal history of adult physical and sexual abuse: Secondary | ICD-10-CM

## 2017-06-25 DIAGNOSIS — F3162 Bipolar disorder, current episode mixed, moderate: Secondary | ICD-10-CM | POA: Diagnosis not present

## 2017-06-25 MED ORDER — AMPHETAMINE-DEXTROAMPHETAMINE 15 MG PO TABS: ORAL_TABLET | ORAL | 0 refills | Status: DC

## 2017-06-25 MED ORDER — OLANZAPINE 2.5 MG PO TABS: 2.5000 mg | ORAL_TABLET | Freq: Every day | ORAL | 2 refills | Status: DC

## 2017-06-25 MED ORDER — ALPRAZOLAM 1 MG PO TABS: 1.0000 mg | ORAL_TABLET | Freq: Three times a day (TID) | ORAL | 2 refills | Status: DC

## 2017-06-25 MED ORDER — AMITRIPTYLINE HCL 75 MG PO TABS: 75.0000 mg | ORAL_TABLET | Freq: Every day | ORAL | 2 refills | Status: DC
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
## 2017-07-08 ENCOUNTER — Other Ambulatory Visit (HOSPITAL_COMMUNITY): Payer: Self-pay | Admitting: Psychiatry

## 2017-07-08 MED ORDER — OLANZAPINE 2.5 MG PO TABS: 2.5000 mg | ORAL_TABLET | Freq: Every day | ORAL | 2 refills | Status: DC

## 2017-08-06 ENCOUNTER — Encounter: Payer: Self-pay | Admitting: Sports Medicine

## 2017-08-06 ENCOUNTER — Ambulatory Visit (INDEPENDENT_AMBULATORY_CARE_PROVIDER_SITE_OTHER): Payer: Medicare HMO | Admitting: Sports Medicine

## 2017-08-06 DIAGNOSIS — M79675 Pain in left toe(s): Secondary | ICD-10-CM

## 2017-08-06 DIAGNOSIS — Q828 Other specified congenital malformations of skin: Secondary | ICD-10-CM | POA: Diagnosis not present

## 2017-08-06 DIAGNOSIS — M2042 Other hammer toe(s) (acquired), left foot: Secondary | ICD-10-CM | POA: Diagnosis not present

## 2017-08-06 DIAGNOSIS — M79674 Pain in right toe(s): Secondary | ICD-10-CM

## 2017-08-06 DIAGNOSIS — L600 Ingrowing nail: Secondary | ICD-10-CM

## 2017-08-06 DIAGNOSIS — L84 Corns and callosities: Secondary | ICD-10-CM

## 2017-08-06 NOTE — Progress Notes (Signed)
Subjective: Samantha Chambers is a 47 y.o. female patient who returns to office for evaluation of Left foot pain secondary to callus skin in between toes and pain at right 1st and 2nd toenail at corner again like previous. Patient complains of pain at the lesion present at 4-5 toes on left that is slowly building back and hurts with direct pressure. Patient also has tried trimming right 1st and 2nd toenail with some relief especially when she goes for pedicures however states that the nail grows back out in the corner like previous. Patient denies any other pedal complaints.   Patient Active Problem List   Diagnosis Date Noted  . Bipolar 1 disorder, mixed, moderate ([place]) 02/13/2013  . Generalized anxiety disorder 02/13/2013    Current Outpatient Medications on File Prior to Visit  Medication Sig Dispense Refill  . ALPRAZolam (XANAX) 1 MG tablet Take 1 tablet (1 mg total) by mouth 3 (three) times daily. 90 tablet 2  . amitriptyline (ELAVIL) 75 MG tablet Take 1 tablet (75 mg total) by mouth at bedtime. 90 tablet 2  . amphetamine-dextroamphetamine (ADDERALL) 15 MG tablet Take two in the am and one at noon 90 tablet 0  . amphetamine-dextroamphetamine (ADDERALL) 15 MG tablet Take 2 in the am and one at noon 90 tablet 0  . OLANZapine (ZYPREXA) 2.5 MG tablet Take 1 tablet (2.5 mg total) by mouth at bedtime. 90 tablet 2  . ORSYTHIA 0.1-20 MG-MCG tablet      No current facility-administered medications on file prior to visit.     Allergies  Allergen Reactions  . Clonazepam Nausea And Vomiting  . Sulfa Antibiotics   . Vilazodone     Per Pt med made her sick    Objective:  General: Alert and oriented x3 in no acute distress  Dermatology: Keratotic lesion present 4th webspace on left with skin lines transversing the lesion, pain is present with direct pressure to the lesion with a central nucleated core noted, no webspace macerations, no ecchymosis bilateral, all nails x 10 are well manicured  however distally there is mild ingrowing at right hallux nail and 2nd lateral margin with no acute signs of infection as previous.  Vascular: Dorsalis Pedis and Posterior Tibial pedal pulses 2/4, Capillary Fill Time 3 seconds, + pedal hair growth bilateral, no edema bilateral lower extremities, Temperature gradient within normal limits.  Neurology: Johney Maine sensation intact via light touch bilateral.  Musculoskeletal: Mild tenderness with palpation at the keratotic lesion site on left and at right hallux nail and 2nd toe. + varus rotated 5th hammertoe on left. Muscular strength 5/5 in all groups without pain or limitation on range of motion.   Assessment and Plan: Problem List Items Addressed This Visit    None    Visit Diagnoses    Corns and callosities    -  Primary   Toe pain, bilateral          -Complete examination performed -Re-Discussed treatment options -Parred keratoic lesion using a chisel blade at 4th webspace on left -Encouraged daily exfolliating and skin emollients -Encouraged use of pumice stone -Gave toe cap and instructed on use for left 4th webspace -Advised patient may benefit from hammertoe surgery if no improvement as previously discussed -Recommended PNA for right hallux and 2nd toe however patient declined procedure in office today so compliementarly I trimmed the corner of the nail today and advised patient that if recurs then will benefit from PNA. -Advised good supportive shoes and inserts -Patient to return  to office as needed or sooner if condition worsens.  Landis Martins, DPM

## 2017-08-22 ENCOUNTER — Ambulatory Visit (HOSPITAL_COMMUNITY): Payer: Medicaid Other | Admitting: Physical Therapy

## 2017-08-22 ENCOUNTER — Encounter (HOSPITAL_COMMUNITY): Payer: Self-pay

## 2017-08-27 ENCOUNTER — Other Ambulatory Visit: Payer: Self-pay

## 2017-08-27 ENCOUNTER — Ambulatory Visit (HOSPITAL_COMMUNITY): Payer: PRIVATE HEALTH INSURANCE | Attending: Internal Medicine | Admitting: Physical Therapy

## 2017-08-27 DIAGNOSIS — N3946 Mixed incontinence: Secondary | ICD-10-CM | POA: Insufficient documentation

## 2017-08-27 NOTE — Therapy (Addendum)
[facility] [address] [place], [place], [address] Phone: [phone]   Fax:  [phone]  Physical Therapy Evaluation  Patient Details  Name: Samantha Chambers MRN: [id number] Date of Birth: 05/06/1971 Referring Provider: Rafael Chambers   Encounter Date: 08/27/2017  PT End of Session - 08/27/17 1121    Visit Number  1    Number of Visits  6    Date for PT Re-Evaluation  10/08/17    Authorization Type  [place] medicare/medicaid     PT Start Time  1040    PT Stop Time  1115    PT Time Calculation (min)  35 min    Activity Tolerance  Patient tolerated treatment well    Behavior During Therapy  Samantha Chambers for tasks assessed/performed       Past Medical History:  Diagnosis Date  . Anxiety   . Bipolar disorder ([place])   . Depression   . Headache(784.0)   . Irritable bowel     Past Surgical History:  Procedure Laterality Date  . FOOT SURGERY      There were no vitals filed for this visit.   Subjective Assessment - 08/27/17 1045    Subjective  Samantha Chambers states that she has urine leakage with coughing, occasionally with sneezing.  She let her MD know who referred her to therapy.      Pertinent History  healthy     Currently in Pain?  No/denies         [facility] PT Assessment - 08/27/17 0001      Assessment   Medical Diagnosis  incontinence    Referring Provider  Samantha Chambers    Onset Date/Surgical Date  08/07/15    Next MD Visit  02/06/2018    Prior Therapy  none       Precautions   Precautions  None      Restrictions   Weight Bearing Restrictions  Yes      Balance Screen   Has the patient fallen in the past 6 months  No    Has the patient had a decrease in activity level because of a fear of falling?   No    Is the patient reluctant to leave their home because of a fear of falling?   No      Home Film/video editor residence      Prior Function   Level of Independence  Independent    Vocation   Unemployed    Leisure  no hobbies       Cognition   Overall Cognitive Status  Within Functional Limits for tasks assessed      ROM / Strength   AROM / PROM / Strength  Strength  Upper abdominal 3/5 Lower abdominal 3-/5               Objective measurements completed on examination: See above findings.    Pelvic Floor Special Questions - 08/27/17 0001    Prior Pelvic/Prostate Exam  Yes    Date of Last Pelvic/Prostate Exam  08/24/16    Result Pelvic/Prostate Exam   negative     Prior Urinalysis  No    Are you Pregnant or attempting pregnancy?  No    Prior Pregnancies  Yes    Number of Pregnancies  3    Number of Vaginal Deliveries  2    Any difficulty with labor and deliveries  No    Episiotomy Performed  Yes  Currently Sexually Active  Yes    Is this Painful  Yes occasionally     Urinary Leakage  Yes    How often  3x a day    Pad use  2-3    Activities that cause leaking  Coughing;Sneezing    Caffeine beverages  3-4 a day     Falling out feeling (prolapse)  No       [facility] Adult PT Treatment/Exercise - 08/27/17 0001      Exercises   Exercises  Lumbar      Lumbar Exercises: Supine   Heel Slides  10 reps    Other Supine Lumbar Exercises  Kegal:  fast twitch 2 second hold/ 10 second rest x 10; Long hold 8 second hold 16 second rest x 5.        Lumbar Exercises: Sidelying   Hip Abduction  Both;10 reps             PT Education - 08/27/17 1120    Education provided  Yes    Education Details  caffine is a bladder irritant; exercises     Person(s) Educated  Patient    Methods  Explanation;Handout    Comprehension  Verbalized understanding;Returned demonstration       PT Short Term Goals - 08/27/17 1126      PT SHORT TERM GOAL #1   Title  Pt to be I in HEP to obtain continence     Time  2    Status  New    Target Date  09/10/17      PT SHORT TERM GOAL #2   Title  PT to be having one to two episodes of incontinence per day     Time  2     Period  Weeks    Status  New        PT Long Term Goals - 08/27/17 1127      PT LONG TERM GOAL #1   Title  PT continence to imporve to where she is only having one leakage per day.     Time  6    Period  Weeks    Status  New    Target Date  10/08/17      PT LONG TERM GOAL #2   Title  PT to be I in advanced HEP to obtain total continence    Time  3    Period  Months will be accomplished on own not while in PT     Target Date  11/26/17             Plan - 08/27/17 1121    Clinical Impression Statement  Samantha Chambers is a 47 yo female who has been experiencing incontinence for several years.  She is tired of wearing pads and therefore requested a referral to physical therapy for instruction in strengthening her pelvic mm to decrease her incontinence.  Samantha Chambers will benefit from skilled therapy to progress her through a pelvic strengthening course.     Clinical Presentation  Evolving    Clinical Decision Making  Low    Rehab Potential  Good    PT Frequency  1x / week    PT Duration  6 weeks    PT Treatment/Interventions  ADLs/Self Care Home Management;Patient/family education;Therapeutic exercise    PT Next Visit Plan  begin quadriped single leg raise, opposite arm/leg raise,   increase long hold time if able.     PT Home Exercise Plan  heel  slide, hip abduction, quick flicks and long hold for kegals.     Consulted and Agree with Plan of Care  Patient       Patient will benefit from skilled therapeutic intervention in order to improve the following deficits and impairments:  Decreased strength  Visit Diagnosis: Mixed incontinence     Problem List Patient Active Problem List   Diagnosis Date Noted  . Bipolar 1 disorder, mixed, moderate ([place]) 02/13/2013  . Generalized anxiety disorder 02/13/2013    Samantha Chambers, PT CLT [phone] 08/27/2017, 11:30 AM  [facility] [address] [place], [place], [address] Phone:  [phone]   Fax:  [phone]  Name: Samantha Chambers MRN: [id number] Date of Birth: 09/28/1970

## 2017-08-29 ENCOUNTER — Encounter (HOSPITAL_COMMUNITY): Payer: Self-pay | Admitting: Psychiatry

## 2017-08-29 ENCOUNTER — Ambulatory Visit (INDEPENDENT_AMBULATORY_CARE_PROVIDER_SITE_OTHER): Payer: Medicare HMO | Admitting: Psychiatry

## 2017-08-29 VITALS — BP 104/68 | HR 88 | Ht 63.0 in | Wt 131.0 lb

## 2017-08-29 DIAGNOSIS — Z598 Other problems related to housing and economic circumstances: Secondary | ICD-10-CM | POA: Diagnosis not present

## 2017-08-29 DIAGNOSIS — R3915 Urgency of urination: Secondary | ICD-10-CM | POA: Diagnosis not present

## 2017-08-29 DIAGNOSIS — R450 Nervousness: Secondary | ICD-10-CM

## 2017-08-29 DIAGNOSIS — Z818 Family history of other mental and behavioral disorders: Secondary | ICD-10-CM | POA: Diagnosis not present

## 2017-08-29 DIAGNOSIS — Z62810 Personal history of physical and sexual abuse in childhood: Secondary | ICD-10-CM

## 2017-08-29 DIAGNOSIS — F909 Attention-deficit hyperactivity disorder, unspecified type: Secondary | ICD-10-CM

## 2017-08-29 DIAGNOSIS — F419 Anxiety disorder, unspecified: Secondary | ICD-10-CM | POA: Diagnosis not present

## 2017-08-29 DIAGNOSIS — Z91410 Personal history of adult physical and sexual abuse: Secondary | ICD-10-CM | POA: Diagnosis not present

## 2017-08-29 DIAGNOSIS — Z793 Long term (current) use of hormonal contraceptives: Secondary | ICD-10-CM | POA: Diagnosis not present

## 2017-08-29 DIAGNOSIS — Z915 Personal history of self-harm: Secondary | ICD-10-CM

## 2017-08-29 DIAGNOSIS — F3162 Bipolar disorder, current episode mixed, moderate: Secondary | ICD-10-CM | POA: Diagnosis not present

## 2017-08-29 MED ORDER — ALPRAZOLAM 1 MG PO TABS: 1.0000 mg | ORAL_TABLET | Freq: Three times a day (TID) | ORAL | 2 refills | Status: DC

## 2017-08-29 MED ORDER — AMITRIPTYLINE HCL 75 MG PO TABS: 75.0000 mg | ORAL_TABLET | Freq: Every day | ORAL | 2 refills | Status: DC

## 2017-08-29 MED ORDER — AMPHETAMINE-DEXTROAMPHETAMINE 15 MG PO TABS: ORAL_TABLET | ORAL | 0 refills | Status: DC

## 2017-08-29 MED ORDER — OLANZAPINE 2.5 MG PO TABS: 2.5000 mg | ORAL_TABLET | Freq: Every day | ORAL | 2 refills | Status: DC
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
## 2017-09-03 ENCOUNTER — Ambulatory Visit (HOSPITAL_COMMUNITY): Payer: PRIVATE HEALTH INSURANCE | Admitting: Physical Therapy

## 2017-09-03 ENCOUNTER — Encounter (HOSPITAL_COMMUNITY): Payer: Self-pay | Admitting: Physical Therapy

## 2017-09-03 ENCOUNTER — Ambulatory Visit: Payer: Medicare HMO | Admitting: Sports Medicine

## 2017-09-03 DIAGNOSIS — N3946 Mixed incontinence: Secondary | ICD-10-CM

## 2017-09-03 NOTE — Therapy (Signed)
[facility] [place], [place], [address] Phone: [phone]   Fax:  [phone]  Physical Therapy Treatment  Patient Details  Name: Samantha Chambers MRN: [id number] Date of Birth: March 31, 1971 Referring Provider: Rafael Bihari   Encounter Date: 09/03/2017  PT End of Session - 09/03/17 1134    Visit Number  2    Number of Visits  6    Date for PT Re-Evaluation  10/08/17    Authorization Type  [place] medicare/medicaid     PT Start Time  1122    PT Stop Time  1200    PT Time Calculation (min)  38 min    Activity Tolerance  Patient tolerated treatment well    Behavior During Therapy  [facility] for tasks assessed/performed       Past Medical History:  Diagnosis Date  . Anxiety   . Bipolar disorder ([place])   . Depression   . Headache(784.0)   . Irritable bowel     Past Surgical History:  Procedure Laterality Date  . FOOT SURGERY      There were no vitals filed for this visit.  Subjective Assessment - 09/03/17 1123    Subjective  Pt states that she is doing her kegal exercises more than the abdominal.  She has not been doing the abdominal due to not being sure if she was completing them correctly.     Pertinent History  healthy     Currently in Pain?  No/denies                       [facility] Adult PT Treatment/Exercise - 09/03/17 0001      Exercises   Exercises  Lumbar      Lumbar Exercises: Supine   Heel Slides  15 reps    Other Supine Lumbar Exercises  Kegal:  fast twitch 2 second hold/ 4 second rest x 15; Long hold 10 second hold 20 second rest x 10       Other Supine Lumbar Exercises  toe tap x 10       Lumbar Exercises: Sidelying   Hip Abduction  Both;15 reps      Lumbar Exercises: Prone   Straight Leg Raise  10 reps    Opposite Arm/Leg Raise  Right arm/Left leg;Left arm/Right leg;5 reps      Lumbar Exercises: Quadruped   Straight Leg Raise  5 reps    Straight Leg Raises Limitations  with  physical stabilization from therapist              PT Education - 09/03/17 1123    Education provided  Yes    Education Details  Added quadriped exercises.     Person(s) Educated  Patient    Methods  Explanation    Comprehension  Verbalized understanding       PT Short Term Goals - 09/03/17 1205      PT SHORT TERM GOAL #1   Title  Pt to be I in HEP to obtain continence     Time  2    Status  On-going      PT SHORT TERM GOAL #2   Title  PT to be having one to two episodes of incontinence per day     Time  2    Period  Weeks    Status  On-going        PT Long Term Goals - 09/03/17 1205  PT LONG TERM GOAL #1   Title  PT continence to imporve to where she is only having one leakage per day.     Time  6    Period  Weeks    Status  On-going      PT LONG TERM GOAL #2   Title  PT to be I in advanced HEP to obtain total continence    Time  3    Period  Months will be accomplished on own not while in PT     Status  On-going            Plan - 09/03/17 1200    Clinical Impression Statement  Reviewed evaluation and goals with patient.  Increased number of quick flick kegals as well as increasing duration and number of long holds.  Attempted quadriped singl leg raise but pt was not stable changed to prone, added toe taps.     Rehab Potential  Good    PT Frequency  1x / week    PT Duration  6 weeks    PT Treatment/Interventions  ADLs/Self Care Home Management;Patient/family education;Therapeutic exercise    PT Next Visit Plan  attempt quadriped single leg raise, opposite arm/leg raise again.,      PT Home Exercise Plan  heel slide, hip abduction, quick flicks and long hold for kegals. 4/30:  toe tap, prone single leg and opposite arm/leg raise.     Consulted and Agree with Plan of Care  Patient       Patient will benefit from skilled therapeutic intervention in order to improve the following deficits and impairments:  Decreased strength  Visit  Diagnosis: Mixed incontinence     Problem List Patient Active Problem List   Diagnosis Date Noted  . Bipolar 1 disorder, mixed, moderate ([place]) 02/13/2013  . Generalized anxiety disorder 02/13/2013   Rayetta Humphrey, PT CLT [phone] 09/03/2017, 12:05 PM  [facility] [address] [place], [place], [address] Phone: [phone]   Fax:  [phone]  Name: AXEL MEAS MRN: [id number] Date of Birth: [birth year]

## 2017-09-03 NOTE — Patient Instructions (Addendum)
  Straight Leg Raise (Prone)    Abdomen and head supported, keep left knee locked and raise leg at hip. Avoid arching low back. Repeat __10__ times per set. Do _1__ sets per session. Do _2___ sessions per day.  http://orth.exer.us/1112   Copyright  VHI. All rights reserved.  Opposite Arm / Leg Lift (Prone)    Abdomen and head supported, left knee locked, raise leg and opposite arm ___2_ inches from floor. Repeat __10__ times per set. Do __1__ sets per session. Do _2___ sessions per day.  http://orth.exer.us/1114   Copyright  VHI. All rights reserved.  Bracing With Leg March (Hook-Lying)    With neutral spine, lift feet off the ground thighs parallel with the floor, tighten pelvic floor and abdominals andslowly tap right foot the the ground then left hold. . Repeat _10__ times. Do _1__ times a day.   Copyright  VHI. All rights reserved.

## 2017-09-11 ENCOUNTER — Ambulatory Visit (HOSPITAL_COMMUNITY): Payer: PRIVATE HEALTH INSURANCE | Admitting: Physical Therapy

## 2017-09-11 ENCOUNTER — Telehealth (HOSPITAL_COMMUNITY): Payer: Self-pay | Admitting: Physical Therapy
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
## 2017-09-17 ENCOUNTER — Ambulatory Visit (HOSPITAL_COMMUNITY): Payer: PRIVATE HEALTH INSURANCE | Attending: Internal Medicine | Admitting: Physical Therapy

## 2017-09-17 ENCOUNTER — Encounter (HOSPITAL_COMMUNITY): Payer: Self-pay | Admitting: Physical Therapy

## 2017-09-17 DIAGNOSIS — N3946 Mixed incontinence: Secondary | ICD-10-CM | POA: Diagnosis not present

## 2017-09-17 NOTE — Therapy (Signed)
[place], [place], [address] Phone: [phone]   Fax:  [phone]  Physical Therapy Treatment  Patient Details  Name: Samantha Chambers MRN: [id number] Date of Birth: 1970-06-04 Referring Provider: Rafael Bihari   Encounter Date: 09/17/2017  PT End of Session - 09/17/17 1157    Visit Number  3    Number of Visits  6    Date for PT Re-Evaluation  10/08/17    Authorization Type  [place] medicare/medicaid     PT Start Time  1115    PT Stop Time  1157    PT Time Calculation (min)  42 min    Activity Tolerance  Patient tolerated treatment well    Behavior During Therapy  [facility] for tasks assessed/performed       Past Medical History:  Diagnosis Date  . Anxiety   . Bipolar disorder ([place])   . Depression   . Headache(784.0)   . Irritable bowel     Past Surgical History:  Procedure Laterality Date  . FOOT SURGERY      There were no vitals filed for this visit.  Subjective Assessment - 09/17/17 1117    Subjective  Pt has no  complaints;  she has been completing her exercises.     Pertinent History  healthy     Currently in Pain?  No/denies           [facility] Adult PT Treatment/Exercise - 09/17/17 0001      Exercises   Exercises  Lumbar      Lumbar Exercises: Standing   Other Standing Lumbar Exercises  t-band shoulder flexion Rt  10; Lt 10       Lumbar Exercises: Supine   AB Set Limitations  crunch x 15     Heel Slides  15 reps    Other Supine Lumbar Exercises  Kegal:  fast twitch 2 second hold/ 4 second rest x 20; Long hold 15 second hold 30 second rest x 10       Other Supine Lumbar Exercises  toe tap x 15; B Leg lowering x 10  And bicycling      Lumbar Exercises: Sidelying   Hip Abduction  Both;15 reps      Lumbar Exercises: Prone   Straight Leg Raise  10 reps    Opposite Arm/Leg Raise  Right arm/Left leg;Left arm/Right leg;10 reps             PT Education - 09/17/17 1157    Education  provided  Yes    Education Details  HEP    Person(s) Educated  Patient    Methods  Explanation    Comprehension  Verbalized understanding       PT Short Term Goals - 09/03/17 1205      PT SHORT TERM GOAL #1   Title  Pt to be I in HEP to obtain continence     Time  2    Status  On-going      PT SHORT TERM GOAL #2   Title  PT to be having one to two episodes of incontinence per day     Time  2    Period  Weeks    Status  On-going        PT Long Term Goals - 09/03/17 1205      PT LONG TERM GOAL #1   Title  PT continence to imporve to where she is only having one leakage per  day.     Time  6    Period  Weeks    Status  On-going      PT LONG TERM GOAL #2   Title  PT to be I in advanced HEP to obtain total continence    Time  3    Period  Months will be accomplished on own not while in PT     Status  On-going            Plan - 09/17/17 1158    Clinical Impression Statement  Increased quick flicks to 2 minutes.  Increased long hold kegals to 15 seconds.  Added quadriped , crunch and standing flexion with tband to pt HEP     Rehab Potential  Good    PT Frequency  1x / week    PT Duration  6 weeks    PT Treatment/Interventions  ADLs/Self Care Home Management;Patient/family education;Therapeutic exercise    PT Next Visit Plan  begin putting with tband and reverse putting with t band      PT Home Exercise Plan  heel slide, hip abduction, quick flicks and long hold for kegals. 4/30:  toe tap, prone single leg and opposite arm/leg raise.     Consulted and Agree with Plan of Care  Patient       Patient will benefit from skilled therapeutic intervention in order to improve the following deficits and impairments:  Decreased strength  Visit Diagnosis: Mixed incontinence     Problem List Patient Active Problem List   Diagnosis Date Noted  . Bipolar 1 disorder, mixed, moderate ([place]) 02/13/2013  . Generalized anxiety disorder 02/13/2013    Rayetta Humphrey, PT  CLT [phone] 09/17/2017, 12:00 PM  [facility] [address] [place], [place], [address] Phone: [phone]   Fax:  [phone]  Name: Samantha Chambers MRN: [id number] Date of Birth: Nov 14, 1970

## 2017-09-17 NOTE — Patient Instructions (Addendum)
Upper / Lower Extremity Extension (All-Fours)    Tighten stomach and raise right leg and opposite arm. Keep trunk rigid. Repeat __10__ times per set. Do __1__ sets per session. Do 2____ sessions per day.  http://orth.exer.us/1118   Copyright  VHI. All rights reserved.  Bracing With Curl-Up (Hook-Lying)    With neutral spine, tighten pelvic floor and abdominals and hold. Lift head and shoulders. Repeat 10___ times. Do __1_ times a day. Alternate arm positions: Fold arms across chest. Support neck with clasped hands.     Copyright  VHI. All rights reserved.  Bracing With Leg Lowering (Hook-Lying)    With neutral spine, tighten pelvic floor and abdominals and hold. Straighten both legs straight to the ceiling.Lower  legs to floor, then return to bent position. Repeat with other leg. Repeat _10__ times. Do _1__ times a day.   Copyright  VHI. All rights reserved.

## 2017-09-24 ENCOUNTER — Ambulatory Visit (HOSPITAL_COMMUNITY): Payer: PRIVATE HEALTH INSURANCE | Admitting: Physical Therapy

## 2017-10-01 ENCOUNTER — Encounter (HOSPITAL_COMMUNITY): Payer: Self-pay | Admitting: Physical Therapy

## 2017-10-01 ENCOUNTER — Ambulatory Visit (HOSPITAL_COMMUNITY): Payer: PRIVATE HEALTH INSURANCE | Admitting: Physical Therapy

## 2017-10-01 DIAGNOSIS — N3946 Mixed incontinence: Secondary | ICD-10-CM

## 2017-10-01 NOTE — Therapy (Signed)
[facility] [address] [place], [place], [address] Phone: [phone]   Fax:  [phone]  Physical Therapy Treatment  Patient Details  Name: Samantha Chambers MRN: [id number] Date of Birth: [birth year] Referring Provider: Rafael Bihari   Encounter Date: 10/01/2017  PT End of Session - 10/01/17 1143    Visit Number  4    Number of Visits  6    Date for PT Re-Evaluation  10/08/17    Authorization Type  [place] medicare/medicaid     PT Start Time  1120    PT Stop Time  1200    PT Time Calculation (min)  40 min    Activity Tolerance  Patient tolerated treatment well    Behavior During Therapy  [facility] for tasks assessed/performed       Past Medical History:  Diagnosis Date  . Anxiety   . Bipolar disorder ([place])   . Depression   . Headache(784.0)   . Irritable bowel     Past Surgical History:  Procedure Laterality Date  . FOOT SURGERY      There were no vitals filed for this visit.  Subjective Assessment - 10/01/17 1125    Subjective  Pt states that she is can tell a difference.  She was coughing and normally would have had an accident but did not.     Pertinent History  healthy     Currently in Pain?  No/denies            [facility] Adult PT Treatment/Exercise - 10/01/17 0001      Exercises   Exercises  Lumbar      Lumbar Exercises: Standing   Other Standing Lumbar Exercises  t-band shoulder flexion Rt  10; Lt 10     Other Standing Lumbar Exercises  t-band adduction, abduction, Single shoulder extension from above and double extension from above x 10 @       Lumbar Exercises: Supine   AB Set Limitations  crunch x 15       Lumbar Exercises: Quadruped   Straight Leg Raise  10 reps    Opposite Arm/Leg Raise  10 reps      Kegal quick flick x 69minutes; long holds 15 seconds with 30 second hold x 10          PT Short Term Goals - 09/03/17 1205      PT SHORT TERM GOAL #1   Title  Pt to be I in HEP to obtain  continence     Time  2    Status  On-going      PT SHORT TERM GOAL #2   Title  PT to be having one to two episodes of incontinence per day     Time  2    Period  Weeks    Status  On-going        PT Long Term Goals - 09/03/17 1205      PT LONG TERM GOAL #1   Title  PT continence to imporve to where she is only having one leakage per day.     Time  6    Period  Weeks    Status  On-going      PT LONG TERM GOAL #2   Title  PT to be I in advanced HEP to obtain total continence    Time  3    Period  Months will be accomplished on own not while in PT     Status  On-going            Plan - 10/01/17 1158    Rehab Potential  Good    PT Frequency  1x / week    PT Duration  6 weeks    PT Treatment/Interventions  ADLs/Self Care Home Management;Patient/family education;Therapeutic exercise    PT Next Visit Plan  begin putting with tband and reverse putting with t band      PT Home Exercise Plan  heel slide, hip abduction, quick flicks and long hold for kegals. 4/30:  toe tap, prone single leg and opposite arm/leg raise.     Consulted and Agree with Plan of Care  Patient       Patient will benefit from skilled therapeutic intervention in order to improve the following deficits and impairments:  Decreased strength  Visit Diagnosis: Mixed incontinence     Problem List Patient Active Problem List   Diagnosis Date Noted  . Bipolar 1 disorder, mixed, moderate ([place]) 02/13/2013  . Generalized anxiety disorder 02/13/2013    Rayetta Humphrey, PT CLT [phone] 10/01/2017, 12:03 PM  [facility] [address] [place], [place], [address] Phone: [phone]   Fax:  [phone]  Name: Samantha Chambers MRN: [id number] Date of Birth: 12/19/1970

## 2017-10-03 ENCOUNTER — Telehealth (HOSPITAL_COMMUNITY): Payer: Self-pay | Admitting: Physical Therapy

## 2017-10-03 ENCOUNTER — Telehealth (HOSPITAL_COMMUNITY): Payer: Self-pay | Admitting: Internal Medicine

## 2017-10-03 NOTE — Telephone Encounter (Signed)
Have to take her daughter to camp

## 2017-10-03 NOTE — Telephone Encounter (Signed)
10/03/17  pt rescheduled because her daughter had 2 appts in [place] as same day as her appt here

## 2017-10-08 ENCOUNTER — Encounter (HOSPITAL_COMMUNITY): Payer: Self-pay | Admitting: Physical Therapy

## 2017-10-09 ENCOUNTER — Encounter (HOSPITAL_COMMUNITY): Payer: Self-pay | Admitting: Physical Therapy

## 2017-10-15 ENCOUNTER — Encounter (HOSPITAL_COMMUNITY): Payer: Self-pay | Admitting: Physical Therapy

## 2017-10-15 ENCOUNTER — Ambulatory Visit (HOSPITAL_COMMUNITY): Payer: PRIVATE HEALTH INSURANCE | Attending: Internal Medicine | Admitting: Physical Therapy

## 2017-10-15 DIAGNOSIS — N3946 Mixed incontinence: Secondary | ICD-10-CM

## 2017-10-15 NOTE — Therapy (Signed)
[facility] [address] [place], [place], [address] Phone: [phone]   Fax:  [phone]  Physical Therapy Treatment  Patient Details  Name: Samantha Chambers MRN: [id number] Date of Birth: 12-Apr-1971 Referring Provider: Rafael Bihari   Encounter Date: 10/15/2017  PT End of Session - 10/15/17 1405    Visit Number  5    Number of Visits  5    Date for PT Re-Evaluation  10/08/17    Authorization Type  [place] medicare/medicaid     PT Start Time  1350    PT Stop Time  1430    PT Time Calculation (min)  40 min    Activity Tolerance  Patient tolerated treatment well    Behavior During Therapy  [facility] for tasks assessed/performed       Past Medical History:  Diagnosis Date  . Anxiety   . Bipolar disorder ([place])   . Depression   . Headache(784.0)   . Irritable bowel     Past Surgical History:  Procedure Laterality Date  . FOOT SURGERY      There were no vitals filed for this visit.  Subjective Assessment - 10/15/17 1353    Subjective  Pt states that she is getting her exercises done about every other day.    Pertinent History  healthy     Currently in Pain?  No/denies         [facility] PT Assessment - 10/15/17 0001      Assessment   Medical Diagnosis  incontinence    Referring Provider  Rafael Bihari    Onset Date/Surgical Date  08/07/15    Next MD Visit  02/06/2018    Prior Therapy  none       Precautions   Precautions  None      Restrictions   Weight Bearing Restrictions  Yes      Balance Screen   Has the patient fallen in the past 6 months  No    Has the patient had a decrease in activity level because of a fear of falling?   No    Is the patient reluctant to leave their home because of a fear of falling?   No      Home Film/video editor residence      Prior Function   Level of Independence  Independent    Vocation  Unemployed    Leisure  no hobbies       Cognition   Overall Cognitive  Status  Within Functional Limits for tasks assessed      Strength   Overall Strength Comments  upper abdominal 5/5; lower 3+5 upper was 3/5 ; Lower was 3-/5                 Pelvic Floor Special Questions - 10/15/17 0001    Prior Pelvic/Prostate Exam  Yes    Date of Last Pelvic/Prostate Exam  08/24/16    Result Pelvic/Prostate Exam   negative     Prior Urinalysis  No    Are you Pregnant or attempting pregnancy?  No    Number of Pregnancies  3    Number of Vaginal Deliveries  2    Any difficulty with labor and deliveries  No    Episiotomy Performed  Yes    Currently Sexually Active  Yes    Is this Painful  Yes occasionally     Urinary Leakage  Yes    How often  2 x a day     Pad use  2 -3 no change     Activities that cause leaking  Coughing;Sneezing    Caffeine beverages  3-4 a day     Falling out feeling (prolapse)  No        [facility] Adult PT Treatment/Exercise - 10/15/17 0001      Exercises   Exercises  Lumbar      Lumbar Exercises: Standing   Other Standing Lumbar Exercises  t-band shoulder flexion Rt  10; Lt 10     Other Standing Lumbar Exercises  t-band adduction, abduction, Single shoulder extension from above and double extension from above x 10 @       Lumbar Exercises: Supine   Other Supine Lumbar Exercises  Long hold 25" x 5; quick flick x 2'      Lumbar Exercises: Quadruped   Opposite Arm/Leg Raise  10 reps               PT Short Term Goals - 10/15/17 1411      PT SHORT TERM GOAL #1   Title  Pt to be I in HEP to obtain continence     Time  2    Status  Achieved      PT SHORT TERM GOAL #2   Title  PT to be having one to two episodes of incontinence per day     Time  2    Period  Weeks    Status  Partially Met        PT Long Term Goals - 10/15/17 1411      PT LONG TERM GOAL #1   Title  PT continence to imporve to where she is only having one leakage per day.     Time  6    Period  Weeks    Status  Not Met      PT LONG TERM  GOAL #2   Title  PT to be I in advanced HEP to obtain total continence    Time  3    Period  Months will be accomplished on own not while in PT     Status  Achieved            Plan - 10/15/17 1429    Clinical Impression Statement  Reviewed all exercises with pt who voices I in all activity.  Pt was able to increase long hold kegals to 25 seconds.  Explained to pt that pelvic floor mm strengthen slowly and is will take montls before she is totally continent again;  pt voices that she can tell that she is able to sneeze and cough on occasion now without leakage.  Pt is ready for discharge.     Rehab Potential  Good    PT Frequency  1x / week    PT Duration  6 weeks    PT Treatment/Interventions  ADLs/Self Care Home Management;Patient/family education;Therapeutic exercise    PT Next Visit Plan  Discharge     PT Home Exercise Plan  heel slide, hip abduction, quick flicks and long hold for kegals. 4/30:  toe tap, prone single leg and opposite arm/leg raise.     Consulted and Agree with Plan of Care  Patient       Patient will benefit from skilled therapeutic intervention in order to improve the following deficits and impairments:  Decreased strength  Visit Diagnosis: Mixed incontinence     Problem List Patient Active Problem List  Diagnosis Date Noted  . Bipolar 1 disorder, mixed, moderate ([place]) 02/13/2013  . Generalized anxiety disorder 02/13/2013    Rayetta Humphrey, PT CLT [phone] 10/15/2017, 2:34 PM  [facility] [address] [place], [place], [address] Phone: [phone]   Fax:  [phone]  Name: Samantha Chambers MRN: [id number] Date of Birth: Jan 12, 1971  PHYSICAL THERAPY DISCHARGE SUMMARY  Visits from Start of Care: 5  Current functional level related to goals / functional outcomes: See above   Remaining deficits: See above    Education / Equipment: HEP Plan: Patient agrees to discharge.  Patient goals  were partially met. Patient is being discharged due to being pleased with the current functional level.  ?????       Rayetta Humphrey, [place] CLT [phone]

## 2017-10-21 ENCOUNTER — Telehealth (HOSPITAL_COMMUNITY): Payer: Self-pay | Admitting: Physical Therapy

## 2017-10-21 NOTE — Telephone Encounter (Signed)
Patient called to say she was discharged by [facility] and is still getting calls from the phone tree reminding her appts. I told her she did have one showing and I would make sure I canceled it .

## 2017-10-22 ENCOUNTER — Encounter (HOSPITAL_COMMUNITY): Payer: Self-pay | Admitting: Physical Therapy

## 2017-10-22 DIAGNOSIS — Z1231 Encounter for screening mammogram for malignant neoplasm of breast: Secondary | ICD-10-CM | POA: Diagnosis not present

## 2017-10-25 ENCOUNTER — Other Ambulatory Visit (HOSPITAL_COMMUNITY): Payer: Self-pay | Admitting: Psychiatry

## 2017-10-25 MED ORDER — ALPRAZOLAM 1 MG PO TABS: 1.0000 mg | ORAL_TABLET | Freq: Three times a day (TID) | ORAL | 2 refills | Status: DC

## 2017-10-25 MED ORDER — OLANZAPINE 2.5 MG PO TABS: 2.5000 mg | ORAL_TABLET | Freq: Every day | ORAL | 2 refills | Status: DC

## 2017-10-25 MED ORDER — AMPHETAMINE-DEXTROAMPHETAMINE 15 MG PO TABS: ORAL_TABLET | ORAL | 0 refills | Status: DC

## 2017-10-25 MED ORDER — AMITRIPTYLINE HCL 75 MG PO TABS: 75.0000 mg | ORAL_TABLET | Freq: Every day | ORAL | 2 refills | Status: DC

## 2017-11-15 DIAGNOSIS — F319 Bipolar disorder, unspecified: Secondary | ICD-10-CM | POA: Diagnosis not present

## 2017-11-15 DIAGNOSIS — Z299 Encounter for prophylactic measures, unspecified: Secondary | ICD-10-CM | POA: Diagnosis not present

## 2017-11-15 DIAGNOSIS — E7800 Pure hypercholesterolemia, unspecified: Secondary | ICD-10-CM | POA: Diagnosis not present

## 2017-11-15 DIAGNOSIS — Z6823 Body mass index (BMI) 23.0-23.9, adult: Secondary | ICD-10-CM | POA: Diagnosis not present

## 2017-11-15 DIAGNOSIS — M542 Cervicalgia: Secondary | ICD-10-CM | POA: Diagnosis not present

## 2017-11-25 ENCOUNTER — Encounter (HOSPITAL_COMMUNITY): Payer: Self-pay | Admitting: Psychiatry

## 2017-11-25 ENCOUNTER — Ambulatory Visit (INDEPENDENT_AMBULATORY_CARE_PROVIDER_SITE_OTHER): Payer: Medicare HMO | Admitting: Psychiatry

## 2017-11-25 VITALS — BP 105/70 | HR 118 | Ht 63.0 in | Wt 133.0 lb

## 2017-11-25 DIAGNOSIS — Z818 Family history of other mental and behavioral disorders: Secondary | ICD-10-CM

## 2017-11-25 DIAGNOSIS — F3162 Bipolar disorder, current episode mixed, moderate: Secondary | ICD-10-CM

## 2017-11-25 DIAGNOSIS — R450 Nervousness: Secondary | ICD-10-CM

## 2017-11-25 MED ORDER — ALPRAZOLAM 1 MG PO TABS: 1.0000 mg | ORAL_TABLET | Freq: Four times a day (QID) | ORAL | 2 refills | Status: DC

## 2017-11-25 MED ORDER — OLANZAPINE 2.5 MG PO TABS: 2.5000 mg | ORAL_TABLET | Freq: Every day | ORAL | 2 refills | Status: DC

## 2017-11-25 MED ORDER — AMPHETAMINE-DEXTROAMPHETAMINE 15 MG PO TABS: ORAL_TABLET | ORAL | 0 refills | Status: DC

## 2017-11-25 MED ORDER — AMITRIPTYLINE HCL 100 MG PO TABS: 100.0000 mg | ORAL_TABLET | Freq: Every day | ORAL | 2 refills | Status: DC
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
## 2017-12-18 ENCOUNTER — Encounter (HOSPITAL_COMMUNITY): Payer: Self-pay | Admitting: Psychiatry

## 2017-12-18 ENCOUNTER — Ambulatory Visit (INDEPENDENT_AMBULATORY_CARE_PROVIDER_SITE_OTHER): Payer: Medicare HMO | Admitting: Psychiatry

## 2017-12-18 VITALS — BP 97/67 | HR 96 | Ht 63.0 in

## 2017-12-18 DIAGNOSIS — F3162 Bipolar disorder, current episode mixed, moderate: Secondary | ICD-10-CM | POA: Diagnosis not present

## 2017-12-18 DIAGNOSIS — Z818 Family history of other mental and behavioral disorders: Secondary | ICD-10-CM

## 2017-12-18 DIAGNOSIS — R450 Nervousness: Secondary | ICD-10-CM | POA: Diagnosis not present

## 2017-12-18 MED ORDER — ALPRAZOLAM 1 MG PO TABS: 1.0000 mg | ORAL_TABLET | Freq: Four times a day (QID) | ORAL | 2 refills | Status: DC

## 2017-12-18 MED ORDER — AMPHETAMINE-DEXTROAMPHETAMINE 15 MG PO TABS: ORAL_TABLET | ORAL | 0 refills | Status: DC

## 2017-12-18 MED ORDER — OLANZAPINE 2.5 MG PO TABS: 2.5000 mg | ORAL_TABLET | Freq: Every day | ORAL | 2 refills | Status: DC

## 2017-12-18 MED ORDER — AMITRIPTYLINE HCL 100 MG PO TABS: 100.0000 mg | ORAL_TABLET | Freq: Every day | ORAL | 2 refills | Status: DC
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
## 2018-02-18 ENCOUNTER — Ambulatory Visit (HOSPITAL_COMMUNITY): Payer: Self-pay | Admitting: Psychiatry

## 2018-02-19 ENCOUNTER — Ambulatory Visit (INDEPENDENT_AMBULATORY_CARE_PROVIDER_SITE_OTHER): Payer: Medicare HMO | Admitting: Psychiatry

## 2018-02-19 ENCOUNTER — Encounter (HOSPITAL_COMMUNITY): Payer: Self-pay | Admitting: Psychiatry

## 2018-02-19 VITALS — BP 119/76 | HR 103 | Ht 63.0 in | Wt 138.0 lb

## 2018-02-19 DIAGNOSIS — F3162 Bipolar disorder, current episode mixed, moderate: Secondary | ICD-10-CM | POA: Diagnosis not present

## 2018-02-19 MED ORDER — OLANZAPINE 2.5 MG PO TABS: 2.5000 mg | ORAL_TABLET | Freq: Every day | ORAL | 2 refills | Status: DC

## 2018-02-19 MED ORDER — AMPHETAMINE-DEXTROAMPHETAMINE 15 MG PO TABS: ORAL_TABLET | ORAL | 0 refills | Status: DC

## 2018-02-19 MED ORDER — AMITRIPTYLINE HCL 100 MG PO TABS: 100.0000 mg | ORAL_TABLET | Freq: Every day | ORAL | 2 refills | Status: DC
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
## 2018-02-20 ENCOUNTER — Other Ambulatory Visit (HOSPITAL_COMMUNITY): Payer: Self-pay | Admitting: Psychiatry

## 2018-02-20 ENCOUNTER — Telehealth (HOSPITAL_COMMUNITY): Payer: Self-pay | Admitting: *Deleted

## 2018-02-20 MED ORDER — DIAZEPAM 5 MG PO TABS: 5.0000 mg | ORAL_TABLET | Freq: Four times a day (QID) | ORAL | 2 refills | Status: DC

## 2018-02-20 NOTE — Telephone Encounter (Signed)
Valium sent in, please call pharmacy to cancel xanax

## 2018-02-20 NOTE — Telephone Encounter (Signed)
It doesn't come in 1 mg. I think she means 10 mg but I sent in 5 mg, she can use up to 4 a day

## 2018-02-20 NOTE — Telephone Encounter (Signed)
Dr Harrington Challenger Patient stated that she failed to mention on her appointment yesterday that she felt as she going into a manic bipolar phase. She's been worrying about things coming up , having anxiety & panic attacks. She said she didn't pick up any of the scripts sent to the Rx after appointment. And today she asked if she could maybe be switched to Valium?   # [phone]

## 2018-02-20 NOTE — Telephone Encounter (Signed)
Dr Harrington Challenger [name]

## 2018-03-05 ENCOUNTER — Encounter (HOSPITAL_COMMUNITY): Payer: Self-pay | Admitting: Psychiatry

## 2018-03-05 ENCOUNTER — Ambulatory Visit (INDEPENDENT_AMBULATORY_CARE_PROVIDER_SITE_OTHER): Payer: Medicare HMO | Admitting: Psychiatry

## 2018-03-05 VITALS — BP 106/73 | HR 115 | Ht 63.0 in | Wt 140.0 lb

## 2018-03-05 DIAGNOSIS — F3162 Bipolar disorder, current episode mixed, moderate: Secondary | ICD-10-CM

## 2018-03-05 DIAGNOSIS — F419 Anxiety disorder, unspecified: Secondary | ICD-10-CM

## 2018-03-05 MED ORDER — ZOLPIDEM TARTRATE ER 12.5 MG PO TBCR: 12.5000 mg | EXTENDED_RELEASE_TABLET | Freq: Every evening | ORAL | 1 refills | Status: DC | PRN

## 2018-03-05 MED ORDER — ALPRAZOLAM 1 MG PO TABS: 1.0000 mg | ORAL_TABLET | Freq: Four times a day (QID) | ORAL | 2 refills | Status: DC

## 2018-03-05 MED ORDER — OLANZAPINE 5 MG PO TABS: 5.0000 mg | ORAL_TABLET | Freq: Every day | ORAL | 2 refills | Status: DC
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
## 2018-04-21 ENCOUNTER — Encounter (HOSPITAL_COMMUNITY): Payer: Self-pay | Admitting: Psychiatry

## 2018-04-21 ENCOUNTER — Ambulatory Visit (INDEPENDENT_AMBULATORY_CARE_PROVIDER_SITE_OTHER): Payer: Medicare HMO | Admitting: Psychiatry

## 2018-04-21 VITALS — BP 108/74 | HR 110 | Ht 63.0 in | Wt 137.0 lb

## 2018-04-21 DIAGNOSIS — F3162 Bipolar disorder, current episode mixed, moderate: Secondary | ICD-10-CM | POA: Diagnosis not present

## 2018-04-21 MED ORDER — AMPHETAMINE-DEXTROAMPHETAMINE 15 MG PO TABS: ORAL_TABLET | ORAL | 0 refills | Status: DC

## 2018-04-21 MED ORDER — OLANZAPINE 5 MG PO TABS: 5.0000 mg | ORAL_TABLET | Freq: Every day | ORAL | 2 refills | Status: DC

## 2018-04-21 MED ORDER — AMITRIPTYLINE HCL 100 MG PO TABS: 100.0000 mg | ORAL_TABLET | Freq: Every day | ORAL | 2 refills | Status: DC

## 2018-04-21 MED ORDER — ALPRAZOLAM 1 MG PO TABS: 1.0000 mg | ORAL_TABLET | Freq: Four times a day (QID) | ORAL | 2 refills | Status: DC
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
## 2018-04-28 DIAGNOSIS — F319 Bipolar disorder, unspecified: Secondary | ICD-10-CM | POA: Diagnosis not present

## 2018-04-28 DIAGNOSIS — K219 Gastro-esophageal reflux disease without esophagitis: Secondary | ICD-10-CM | POA: Diagnosis not present

## 2018-04-28 DIAGNOSIS — Z6823 Body mass index (BMI) 23.0-23.9, adult: Secondary | ICD-10-CM | POA: Diagnosis not present

## 2018-04-28 DIAGNOSIS — Z299 Encounter for prophylactic measures, unspecified: Secondary | ICD-10-CM | POA: Diagnosis not present

## 2018-04-28 DIAGNOSIS — L509 Urticaria, unspecified: Secondary | ICD-10-CM | POA: Diagnosis not present

## 2018-05-15 ENCOUNTER — Other Ambulatory Visit (HOSPITAL_COMMUNITY): Payer: Self-pay | Admitting: Psychiatry

## 2018-06-04 DIAGNOSIS — S134XXA Sprain of ligaments of cervical spine, initial encounter: Secondary | ICD-10-CM | POA: Diagnosis not present

## 2018-06-04 DIAGNOSIS — M9902 Segmental and somatic dysfunction of thoracic region: Secondary | ICD-10-CM | POA: Diagnosis not present

## 2018-06-04 DIAGNOSIS — S233XXA Sprain of ligaments of thoracic spine, initial encounter: Secondary | ICD-10-CM | POA: Diagnosis not present

## 2018-06-04 DIAGNOSIS — M9903 Segmental and somatic dysfunction of lumbar region: Secondary | ICD-10-CM | POA: Diagnosis not present

## 2018-06-04 DIAGNOSIS — S338XXA Sprain of other parts of lumbar spine and pelvis, initial encounter: Secondary | ICD-10-CM | POA: Diagnosis not present

## 2018-06-04 DIAGNOSIS — M9901 Segmental and somatic dysfunction of cervical region: Secondary | ICD-10-CM | POA: Diagnosis not present

## 2018-06-05 DIAGNOSIS — Z299 Encounter for prophylactic measures, unspecified: Secondary | ICD-10-CM | POA: Diagnosis not present

## 2018-06-05 DIAGNOSIS — Z2821 Immunization not carried out because of patient refusal: Secondary | ICD-10-CM | POA: Diagnosis not present

## 2018-06-05 DIAGNOSIS — R490 Dysphonia: Secondary | ICD-10-CM | POA: Diagnosis not present

## 2018-06-05 DIAGNOSIS — Z87891 Personal history of nicotine dependence: Secondary | ICD-10-CM | POA: Diagnosis not present

## 2018-06-05 DIAGNOSIS — L509 Urticaria, unspecified: Secondary | ICD-10-CM | POA: Diagnosis not present

## 2018-06-05 DIAGNOSIS — Z6824 Body mass index (BMI) 24.0-24.9, adult: Secondary | ICD-10-CM | POA: Diagnosis not present

## 2018-06-11 DIAGNOSIS — S233XXA Sprain of ligaments of thoracic spine, initial encounter: Secondary | ICD-10-CM | POA: Diagnosis not present

## 2018-06-11 DIAGNOSIS — M9901 Segmental and somatic dysfunction of cervical region: Secondary | ICD-10-CM | POA: Diagnosis not present

## 2018-06-11 DIAGNOSIS — S134XXA Sprain of ligaments of cervical spine, initial encounter: Secondary | ICD-10-CM | POA: Diagnosis not present

## 2018-06-11 DIAGNOSIS — M9902 Segmental and somatic dysfunction of thoracic region: Secondary | ICD-10-CM | POA: Diagnosis not present

## 2018-06-11 DIAGNOSIS — M9903 Segmental and somatic dysfunction of lumbar region: Secondary | ICD-10-CM | POA: Diagnosis not present

## 2018-06-11 DIAGNOSIS — S338XXA Sprain of other parts of lumbar spine and pelvis, initial encounter: Secondary | ICD-10-CM | POA: Diagnosis not present

## 2018-06-24 ENCOUNTER — Ambulatory Visit (HOSPITAL_COMMUNITY): Payer: Medicare HMO | Admitting: Psychiatry

## 2018-06-24 ENCOUNTER — Telehealth (HOSPITAL_COMMUNITY): Payer: Self-pay | Admitting: *Deleted

## 2018-06-24 ENCOUNTER — Other Ambulatory Visit (HOSPITAL_COMMUNITY): Payer: Self-pay | Admitting: Psychiatry

## 2018-06-24 MED ORDER — AMPHETAMINE-DEXTROAMPHETAMINE 15 MG PO TABS: ORAL_TABLET | ORAL | 0 refills | Status: DC

## 2018-06-24 MED ORDER — ALPRAZOLAM 1 MG PO TABS: 1.0000 mg | ORAL_TABLET | Freq: Four times a day (QID) | ORAL | 2 refills | Status: DC

## 2018-06-24 NOTE — Telephone Encounter (Signed)
sent 

## 2018-06-24 NOTE — Telephone Encounter (Signed)
Dr Ign[facility] had to rescheduled patient's appointment due to her daughter begin ill.  She requested refill on  Adderall & Xanax. Next appointment is 07/17/2018

## 2018-06-25 DIAGNOSIS — M9901 Segmental and somatic dysfunction of cervical region: Secondary | ICD-10-CM | POA: Diagnosis not present

## 2018-06-25 DIAGNOSIS — M9902 Segmental and somatic dysfunction of thoracic region: Secondary | ICD-10-CM | POA: Diagnosis not present

## 2018-06-25 DIAGNOSIS — S338XXA Sprain of other parts of lumbar spine and pelvis, initial encounter: Secondary | ICD-10-CM | POA: Diagnosis not present

## 2018-06-25 DIAGNOSIS — S233XXA Sprain of ligaments of thoracic spine, initial encounter: Secondary | ICD-10-CM | POA: Diagnosis not present

## 2018-06-25 DIAGNOSIS — M9903 Segmental and somatic dysfunction of lumbar region: Secondary | ICD-10-CM | POA: Diagnosis not present

## 2018-06-25 DIAGNOSIS — S134XXA Sprain of ligaments of cervical spine, initial encounter: Secondary | ICD-10-CM | POA: Diagnosis not present

## 2018-07-02 ENCOUNTER — Other Ambulatory Visit: Payer: Self-pay

## 2018-07-02 ENCOUNTER — Encounter: Payer: Self-pay | Admitting: Allergy & Immunology

## 2018-07-02 ENCOUNTER — Ambulatory Visit (INDEPENDENT_AMBULATORY_CARE_PROVIDER_SITE_OTHER): Payer: Medicare HMO | Admitting: Allergy & Immunology

## 2018-07-02 VITALS — BP 100/64 | HR 100 | Temp 98.3°F | Resp 16 | Ht 63.25 in | Wt 140.0 lb

## 2018-07-02 DIAGNOSIS — T7800XD Anaphylactic reaction due to unspecified food, subsequent encounter: Secondary | ICD-10-CM

## 2018-07-02 MED ORDER — EPINEPHRINE 0.3 MG/0.3ML IJ SOAJ: 0.3000 mg | Freq: Once | INTRAMUSCULAR | 2 refills | Status: AC

## 2018-07-02 NOTE — Progress Notes (Signed)
NEW PATIENT  Date of Service/Encounter:  07/02/18  Referring provider: Monico Blitz, MD   Assessment:   Anaphylactic shock - unknown trigger   Samantha Chambers is a delightful 48 year old female presenting for an evaluation of anaphylaxis of unknown etiology.  Her episodes currently fit the diagnosis of anaphylaxis given the GI symptoms in combination with the throat swelling and the urticaria.  This is well more than the 2 systems required to officially diagnosed with anaphylaxis.  It is still unclear what the cause of her symptoms are.  Testing today unfortunately was nonreactive with regards to her positive control, so it is very difficult to interpret the testing.  We are going to get some labs to rule out mastocytosis as well as alpha gal allergy.  She is concerned with an autoimmune etiology, so we will send a screening ANA.  We are going to provide an epinephrine autoinjector and we provided training so that she feels comfortable utilizing it if necessary.  This will certainly give her a sense of control with regards to her episodes.  Plan/Recommendations:   1. Anaphylactic shock due to food - Testing was non reactive to the positive control, making the interpretation difficult. - We will get blood work instead. - We will also get some lab work to look for serious causes of hives/anaphylaxis.  - EpiPen training provided. - Anaphylaxis management plan provided.  - We will call you in 1-2 weeks with the results of the testing.  2. Return in about 3 months (around 09/30/2018).  Subjective:   Samantha Chambers is a 48 y.o. female presenting today for evaluation of  Chief Complaint  Patient presents with  . Allergic Reaction    throat swelling, hives, loss of voice, tongue swelling. unable to pin point a reason. most recent incident was a month ago. states it seems to happen about once monthly.     [name] has a history of the following: Patient Active Problem List   Diagnosis Date Noted  . Bipolar 1 disorder, mixed, moderate ([place]) 02/13/2013  . Generalized anxiety disorder 02/13/2013    History obtained from: chart review and patient.  [name] was referred by Monico Blitz, MD.     Samantha Chambers is a 48 y.o. female presenting for an evaluation of allergic reactions.  Episode #1: Her first reaction was in November and she had some tingling in her throat. She had just eaten pineapple and she thinks that this was the trigger. She did not take Benadryl at all.   Episode #2: The next day, she had taken another melatonin gummy and she developed the sensation of throat swelling. She did not take Benadryl at all. She did have some nausea this time.   Episode #3: She was shopping one month later in December 2019 and she had been shopping for four hours. She had not eaten recently. She reports that she had a hot flash while driving home. She pulled over and let her daughter drive. At that time, she had Benadryl and she popped some Benadryl with improvement in her symptoms. She did have some nausea this time.  Episode #4: She awoke itching with hives and she had throat swelling. She treated this with Benadryl. She feels that her "system needs to purge". She has some nausea and diarrhea. She does report some flushing with this episode. She had taken diclofenac the evening prior to this. She was wheezing.  Every time the Benadryl does eventually help. She estimates that it  helps within 10-15 minutes. She will get prescribed H1 and H2 blockers during the reactions by her PCP. She has never been to the hospital at all. She has never been given an EpiPen.   She does eat red meat around 2-3 times per week. She has reacted to banana and avocado in the past (stomach pain, cramping, diarrhea). She does tolerate peanut drink milk but mostly mixed into things. She eats eggs as well as soy.   She does have some allergic rhinitis symptoms but this is nothing that seems to  bother her. She does have dogs but has always had dogs. She does have a cat for the last 3 months. he does not have asthma at all.   She does have a history of bipolar disorder as well as generalized anxiety disorder.  She has had these issues for years.  She is currently on olanzapine as well as Valium and Ativan as needed.  These doses have been stable for a number of years.  She has a history of arthritis and was recently placed on diclofenac in July 2019.  Otherwise, there is no history of other atopic diseases, including asthma, drug allergies, stinging insect allergies, eczema, urticaria or contact dermatitis. There is no significant infectious history. Vaccinations are up to date.    Past Medical History: Patient Active Problem List   Diagnosis Date Noted  . Bipolar 1 disorder, mixed, moderate ([place]) 02/13/2013  . Generalized anxiety disorder 02/13/2013    Medication List:  Allergies as of 07/02/2018      Reactions   Clonazepam Nausea And Vomiting   Sulfa Antibiotics    Vilazodone    Per Pt med made her sick      Medication List       Accurate as of July 02, 2018 11:51 AM. Always use your most recent med list.        ALPRAZolam 1 MG tablet Commonly known as:  XANAX Take 1 tablet (1 mg total) by mouth 4 (four) times daily.   amitriptyline 100 MG tablet Commonly known as:  ELAVIL Take 1 tablet (100 mg total) by mouth at bedtime.   amphetamine-dextroamphetamine 15 MG tablet Commonly known as:  ADDERALL Take two in the am and one at noon   amphetamine-dextroamphetamine 15 MG tablet Commonly known as:  ADDERALL Take 2 in the am and one at noon   baclofen 10 MG tablet Commonly known as:  LIORESAL   diazepam 5 MG tablet Commonly known as:  VALIUM TAKE 1 TABLET (5 MG TOTAL) BY MOUTH 4 (FOUR) TIMES DAILY.   EPINEPHrine 0.3 mg/0.3 mL Soaj injection Commonly known as:  EPIPEN 2-PAK Inject 0.3 mLs (0.3 mg total) into the muscle once for 1 dose.   famotidine 20 MG  tablet Commonly known as:  PEPCID   OLANZapine 5 MG tablet Commonly known as:  ZYPREXA TAKE 1 TABLET BY MOUTH EVERYDAY AT BEDTIME   ORSYTHIA 0.1-20 MG-MCG tablet Generic drug:  levonorgestrel-ethinyl estradiol   zolpidem 12.5 MG CR tablet Commonly known as:  AMBIEN CR Take 1 tablet (12.5 mg total) by mouth at bedtime as needed for sleep.       Birth History: non-contributory  Developmental History: non-contributory.   Past Surgical History: Past Surgical History:  Procedure Laterality Date  . FOOT SURGERY       Family History: Family History  Problem Relation Age of Onset  . Bipolar disorder Mother   . Depression Maternal Aunt   . Depression Maternal Uncle   .  Urticaria Neg Hx   . Immunodeficiency Neg Hx   . Eczema Neg Hx   . Atopy Neg Hx   . Asthma Neg Hx   . Angioedema Neg Hx   . Allergic rhinitis Neg Hx      Social History: Idali lives at home with her youngest daughter.  She lives in a house that is 38 years old.  There is wood in the main living areas as well as the bedrooms.  She has electric heating and central cooling.  There are four dogs and one cat in the home.  There are no dust mite covers on the bedding.  There is no tobacco exposure.   Review of Systems  Constitutional: Negative.  Negative for fever, malaise/fatigue and weight loss.  HENT: Negative.  Negative for congestion, ear discharge and ear pain.   Eyes: Negative for pain, discharge and redness.  Respiratory: Negative for cough, sputum production, shortness of breath and wheezing.   Cardiovascular: Negative.  Negative for chest pain and palpitations.  Gastrointestinal: Negative for abdominal pain and heartburn.  Skin: Negative.  Negative for itching and rash.  Neurological: Negative for dizziness and headaches.  Endo/Heme/Allergies: Negative for environmental allergies. Does not bruise/bleed easily.  Psychiatric/Behavioral: Positive for depression. Negative for suicidal ideas.        Objective:   Blood pressure 100/64, pulse 100, temperature 98.3 F (36.8 C), temperature source Oral, resp. rate 16, height 5' 3.25" (1.607 m), weight 140 lb (63.5 kg), SpO2 98 %. Body mass index is 24.6 kg/m.   Physical Exam:   Physical Exam  Constitutional: She appears well-developed.  Somewhat tearful at times.  HENT:  Head: Normocephalic and atraumatic.  Right Ear: Tympanic membrane, external ear and ear canal normal. No drainage, swelling or tenderness. Tympanic membrane is not injected, not scarred, not erythematous, not retracted and not bulging.  Left Ear: Tympanic membrane, external ear and ear canal normal. No drainage, swelling or tenderness. Tympanic membrane is not injected, not scarred, not erythematous, not retracted and not bulging.  Nose: No mucosal edema, rhinorrhea, nasal deformity or septal deviation. No epistaxis. Right sinus exhibits no maxillary sinus tenderness and no frontal sinus tenderness. Left sinus exhibits no maxillary sinus tenderness and no frontal sinus tenderness.  Mouth/Throat: Uvula is midline and oropharynx is clear and moist. Mucous membranes are not pale and not dry.  Eyes: Pupils are equal, round, and reactive to light. Conjunctivae and EOM are normal. Right eye exhibits no chemosis and no discharge. Left eye exhibits no chemosis and no discharge. Right conjunctiva is not injected. Left conjunctiva is not injected.  Cardiovascular: Normal rate, regular rhythm, S1 normal, S2 normal and normal heart sounds.  Respiratory: Effort normal and breath sounds normal. No accessory muscle usage. No tachypnea. No respiratory distress. She has no wheezes. She has no rhonchi. She has no rales. She exhibits no tenderness.  Moving air well in all lung fields.  GI: There is no abdominal tenderness. There is no rebound and no guarding.  Lymphadenopathy:       Head (right side): No submandibular, no tonsillar and no occipital adenopathy present.       Head  (left side): No submandibular, no tonsillar and no occipital adenopathy present.    She has no cervical adenopathy.  Neurological: She is alert.  Skin: No abrasion, no petechiae and no rash noted. Rash is not papular, not vesicular and not urticarial. No erythema. No pallor.  No urticaria present.  No excoriations present.  No swelling  appreciated.  Psychiatric: She has a normal mood and affect.     Diagnostic studies:    Allergy Studies:    Airborne Adult Perc - 07/02/18 1028    Time Antigen Placed  1028    Allergen Manufacturer  Lavella Hammock    Location  Back    Number of Test  59    Panel 1  Select    1. Control-Buffer 50% Glycerol  Negative    2. Control-Histamine 1 mg/ml  Negative   Histamine was not reactive   3. Albumin saline  Negative    4. Charmwood  Negative    5. [place]  Negative    6. Johnson  Negative    7. Anderson Blue  Negative    8. Meadow Fescue  Negative    9. Perennial Rye  Negative    10. Sweet Vernal  Negative    11. Timothy  Negative    12. Cocklebur  Negative    13. Burweed Marshelder  Negative    14. Ragweed, short  Negative    15. Ragweed, Giant  Negative    16. Plantain,  English  Negative    17. Lamb's Quarters  Negative    18. Sheep Sorrell  Negative    19. Rough Pigweed  Negative    20. Marsh Elder, Rough  Negative    21. Mugwort, Common  Negative    22. Ash mix  Negative    23. Birch mix  Negative    24. Beech American  Negative    25. Box, Elder  Negative    26. Cedar, red  Negative    27. Cottonwood, [place]  Negative    28. Elm mix  Negative    29. Hickory mix  Negative    30. Maple mix  Negative    31. Oak, [place] mix  Negative    32. Pecan Pollen  Negative    33. Pine mix  Negative    34. Sycamore Eastern  Negative    35. [place], Black Pollen  Negative    36. Alternaria alternata  Negative    37. Cladosporium Herbarum  Negative    38. Aspergillus mix  Negative    39. Penicillium mix  Negative    40. Bipolaris sorokiniana  (Helminthosporium)  Negative    41. Drechslera spicifera (Curvularia)  Negative    42. Mucor plumbeus  Negative    43. Fusarium moniliforme  Negative    44. Aureobasidium pullulans (pullulara)  Negative    45. Rhizopus oryzae  Negative    46. Botrytis cinera  Negative    47. Epicoccum nigrum  Negative    48. Phoma betae  Negative    49. Candida Albicans  Negative    50. Trichophyton mentagrophytes  Negative    51. Mite, D Farinae  5,000 AU/ml  Negative    52. Mite, D Pteronyssinus  5,000 AU/ml  Negative    53. Cat Hair 10,000 BAU/ml  Negative    54.  Dog Epithelia  Negative    55. Mixed Feathers  Negative    56. Horse Epithelia  Negative    57. Cockroach, German  Negative    58. Mouse  Negative    59. Tobacco Leaf  Negative     Food Perc - 07/02/18 1029    Time Antigen Placed  1029    Allergen Manufacturer  Lavella Hammock    Location  Back    Number of allergen test  10  Food  Select    1. Peanut  Negative    2. Soybean food  Negative    3. Wheat, whole  Negative    4. Sesame  Negative    5. Milk, cow  Negative    6. Egg White, chicken  Negative    7. Casein  Negative    8. Shellfish mix  Negative    9. Fish mix  Negative    10. Cashew  Negative       Allergy testing results were read and interpreted by myself, documented by clinical staff.         Salvatore Marvel, MD [facility]

## 2018-07-02 NOTE — Patient Instructions (Addendum)
1. Anaphylactic shock due to food - Testing was non reactive to the positive control, making the interpretation difficult. - We will get blood work instead. - We will also get some lab work to look for serious causes of hives/anaphylaxis.  - EpiPen training provided. - Anaphylaxis management plan provided.  - We will call you in 1-2 weeks with the results of the testing.  2. Return in about 3 months (around 09/30/2018).   Please inform us of any Emergency Department visits, hospitalizations, or changes in symptoms. Call us before going to the ED for breathing or allergy symptoms since we might be able to fit you in for a sick visit. Feel free to contact us anytime with any questions, problems, or concerns.  It was a pleasure to meet you today!  Websites that have reliable patient information: 1. American Academy of Asthma, Allergy, and Immunology: www.aaaai.org 2. Food Allergy Research and Education (FARE): foodallergy.org 3. Mothers of Asthmatics: http://www.asthmacommunitynetwork.org 4. American College of Allergy, Asthma, and Immunology: MonthlyElectricBill.co.uk   Make sure you are registered to vote! If you have moved or changed any of your contact information, you will need to get this updated before voting!    Voter ID laws are NOT going into effect for the General Election in November 2020! DO NOT let this stop you from exercising your right to vote!

## 2018-07-08 ENCOUNTER — Telehealth: Payer: Self-pay | Admitting: Allergy & Immunology

## 2018-07-08 LAB — IGE+ALLERGENS ZONE 2(30)
Alternaria Alternata IgE: <0.1 kU/L
Amer Sycamore IgE Qn: <0.1 kU/L
Aspergillus Fumigatus IgE: <0.1 kU/L
Bahia Grass IgE: <0.1 kU/L
Bermuda Grass IgE: <0.1 kU/L
Cat Dander IgE: <0.1 kU/L
Cedar, Mountain IgE: <0.1 kU/L
Cladosporium Herbarum IgE: <0.1 kU/L
Cockroach, American IgE: <0.1 kU/L
Common Silver Birch IgE: <0.1 kU/L
D Farinae IgE: <0.1 kU/L
D Pteronyssinus IgE: <0.1 kU/L
Dog Dander IgE: <0.1 kU/L
Elm, American IgE: <0.1 kU/L
Hickory, White IgE: <0.1 kU/L
IgE (Immunoglobulin E), Serum: 119 IU/mL (ref 6–495)
Johnson Grass IgE: <0.1 kU/L
Mugwort IgE Qn: <0.1 kU/L
Nettle IgE: <0.1 kU/L
Oak, White IgE: <0.1 kU/L
Penicillium Chrysogen IgE: <0.1 kU/L
Pigweed, Rough IgE: <0.1 kU/L
Plantain, English IgE: <0.1 kU/L
Ragweed, Short IgE: <0.1 kU/L
Stemphylium Herbarum IgE: <0.1 kU/L
Timothy Grass IgE: <0.1 kU/L
White Mulberry IgE: <0.1 kU/L

## 2018-07-08 LAB — ALLERGEN PROFILE, BASIC FOOD
Allergen Corn, IgE: <0.1 kU/L
Chocolate/Cacao IgE: <0.1 kU/L
Egg, Whole IgE: <0.1 kU/L
Food Mix (Seafoods) IgE: NEGATIVE
Peanut IgE: <0.1 kU/L
Pork IgE: <0.1 kU/L
Soybean IgE: <0.1 kU/L
Wheat IgE: <0.1 kU/L

## 2018-07-08 LAB — ALPHA-GAL PANEL
Alpha Gal IgE*: 0.16 kU/L — ABNORMAL HIGH (ref <0.10)
BEEF CLASS INTERPRETATION: 0
Beef (Bos spp) IgE: <0.1 kU/L (ref <0.35)
Class Interpretation: 0
Class Interpretation: 0
Pork (Sus spp) IgE: <0.1 kU/L (ref <0.35)

## 2018-07-08 LAB — TRYPTASE: Tryptase: 3.8 ug/L (ref 2.2–13.2)

## 2018-07-08 LAB — ANA W/REFLEX IF POSITIVE: Anti Nuclear Antibody(ANA): NEGATIVE

## 2018-07-08 NOTE — Telephone Encounter (Signed)
Spoke with patient. She is aware of results, but wants to know how long after consumption she should be looking for signs of an allergic reaction? She has been consuming beef for the past three days.

## 2018-07-08 NOTE — Telephone Encounter (Signed)
Patient was told she had alpha-gal or border line Patient was told there may be a delayed reaction after eating red meat Patient has eaten red meat for the past three days What are signs and symptoms to looks for and how long dow it take for a reaction to happen??  Please call

## 2018-07-09 NOTE — Telephone Encounter (Signed)
Alpha gal reactions can be variable, so she could react 6 to 12 hours after eating the beef.  There is also no rhyme or reason to how frequently it occurs.  Sometimes patients with alpha gal can eat beef a few times without a problem and then react randomly.  Salvatore Marvel, MD [facility] [place]

## 2018-07-09 NOTE — Telephone Encounter (Signed)
Spoke with patient and relayed results.  She voiced understanding.

## 2018-07-17 ENCOUNTER — Ambulatory Visit (HOSPITAL_COMMUNITY): Payer: Medicare HMO | Admitting: Psychiatry

## 2018-07-30 ENCOUNTER — Ambulatory Visit (INDEPENDENT_AMBULATORY_CARE_PROVIDER_SITE_OTHER): Payer: Medicare HMO | Admitting: Psychiatry

## 2018-07-30 ENCOUNTER — Other Ambulatory Visit: Payer: Self-pay

## 2018-07-30 ENCOUNTER — Encounter (HOSPITAL_COMMUNITY): Payer: Self-pay | Admitting: Psychiatry

## 2018-07-30 DIAGNOSIS — F3162 Bipolar disorder, current episode mixed, moderate: Secondary | ICD-10-CM | POA: Diagnosis not present

## 2018-07-30 DIAGNOSIS — F909 Attention-deficit hyperactivity disorder, unspecified type: Secondary | ICD-10-CM

## 2018-07-30 DIAGNOSIS — Z91410 Personal history of adult physical and sexual abuse: Secondary | ICD-10-CM | POA: Diagnosis not present

## 2018-07-30 DIAGNOSIS — Z62810 Personal history of physical and sexual abuse in childhood: Secondary | ICD-10-CM

## 2018-07-30 DIAGNOSIS — Z79899 Other long term (current) drug therapy: Secondary | ICD-10-CM

## 2018-07-30 MED ORDER — AMPHETAMINE-DEXTROAMPHETAMINE 15 MG PO TABS: ORAL_TABLET | ORAL | 0 refills | Status: DC

## 2018-07-30 MED ORDER — ALPRAZOLAM 1 MG PO TABS: 1.0000 mg | ORAL_TABLET | Freq: Four times a day (QID) | ORAL | 2 refills | Status: DC

## 2018-07-30 MED ORDER — OLANZAPINE 5 MG PO TABS: ORAL_TABLET | ORAL | 1 refills | Status: DC

## 2018-07-30 MED ORDER — AMITRIPTYLINE HCL 100 MG PO TABS: 100.0000 mg | ORAL_TABLET | Freq: Every day | ORAL | 2 refills | Status: DC

## 2018-07-30 MED ORDER — ZOLPIDEM TARTRATE 10 MG PO TABS: 10.0000 mg | ORAL_TABLET | Freq: Every evening | ORAL | 2 refills | Status: DC | PRN

## 2018-07-30 MED ORDER — DIAZEPAM 5 MG PO TABS: ORAL_TABLET | ORAL | 2 refills | Status: DC
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
## 2018-08-04 DIAGNOSIS — L570 Actinic keratosis: Secondary | ICD-10-CM | POA: Diagnosis not present

## 2018-08-04 DIAGNOSIS — L111 Transient acantholytic dermatosis [Grover]: Secondary | ICD-10-CM | POA: Diagnosis not present

## 2018-08-04 DIAGNOSIS — D239 Other benign neoplasm of skin, unspecified: Secondary | ICD-10-CM | POA: Diagnosis not present

## 2018-08-05 ENCOUNTER — Telehealth: Payer: Self-pay

## 2018-08-05 NOTE — Telephone Encounter (Signed)
Patient called and stated she  had Diclofenac Sodium Delayed Release 75 MG for the first time since November. Saturday around 3 patient hadn't ate in about 16 hours, she had HIVES, Red , Fast heart Rate.   Does it have mammal based products in it since the patient was diagnosed with Alpha Gal.   Patient took Benadryl    Please Advise

## 2018-08-05 NOTE — Telephone Encounter (Signed)
Dr. Ernst Bowler, can you shed any light on the subject?

## 2018-08-06 NOTE — Telephone Encounter (Signed)
Informed patient of the information listed below.  She voiced understanding and will call with questions

## 2018-08-06 NOTE — Telephone Encounter (Signed)
I had to do some digging for this answer.  It should be noted first off that typically those who react to very small amounts of mammalian products such as those found in gelatin and even cows milk typically have alpha gal IgE levels that are very high.  Samantha Chambers's was only 0.16, which is fairly low.   Regardless, the short answer to her question is yes. I looked up the ingredients in the diclofenac tablet that she was describing.  This is shown below.    I also looked up an article on hidden ingredient some medications that can be animal derived and saw this table.  There is magnesium stearate in the diclofenac tablet, which can be animal derived.  Therefore this could be the trigger for her recent reaction.  I would recommend that she talk to the pharmacy and try to get a diclofenac tablet that does not contain any of the below ingredients in order to minimize her risk of reactions in the future.   Salvatore Marvel, MD [facility]

## 2018-09-30 ENCOUNTER — Encounter (HOSPITAL_COMMUNITY): Payer: Self-pay | Admitting: Psychiatry

## 2018-09-30 ENCOUNTER — Other Ambulatory Visit: Payer: Self-pay

## 2018-09-30 ENCOUNTER — Telehealth (HOSPITAL_COMMUNITY): Payer: Self-pay | Admitting: *Deleted

## 2018-09-30 ENCOUNTER — Ambulatory Visit (INDEPENDENT_AMBULATORY_CARE_PROVIDER_SITE_OTHER): Payer: Medicare HMO | Admitting: Psychiatry

## 2018-09-30 DIAGNOSIS — F3162 Bipolar disorder, current episode mixed, moderate: Secondary | ICD-10-CM | POA: Diagnosis not present

## 2018-09-30 MED ORDER — ALPRAZOLAM 1 MG PO TABS: 1.0000 mg | ORAL_TABLET | Freq: Four times a day (QID) | ORAL | 2 refills | Status: DC

## 2018-09-30 MED ORDER — AMPHETAMINE-DEXTROAMPHETAMINE 15 MG PO TABS: ORAL_TABLET | ORAL | 0 refills | Status: DC

## 2018-09-30 MED ORDER — AMITRIPTYLINE HCL 100 MG PO TABS: 100.0000 mg | ORAL_TABLET | Freq: Every day | ORAL | 2 refills | Status: DC

## 2018-09-30 MED ORDER — CARIPRAZINE HCL 1.5 MG PO CAPS: 1.5000 mg | ORAL_CAPSULE | Freq: Every day | ORAL | 2 refills | Status: DC
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
## 2018-09-30 NOTE — Telephone Encounter (Signed)
[employer] # [id number]    09-30-2018  UNTIL 05-07-2019

## 2018-10-01 ENCOUNTER — Ambulatory Visit (INDEPENDENT_AMBULATORY_CARE_PROVIDER_SITE_OTHER): Payer: Medicare HMO | Admitting: Allergy & Immunology

## 2018-10-01 ENCOUNTER — Encounter: Payer: Self-pay | Admitting: Allergy & Immunology

## 2018-10-01 VITALS — BP 102/72 | HR 116 | Temp 98.6°F | Resp 18

## 2018-10-01 DIAGNOSIS — T7800XD Anaphylactic reaction due to unspecified food, subsequent encounter: Secondary | ICD-10-CM

## 2018-10-01 DIAGNOSIS — J310 Chronic rhinitis: Secondary | ICD-10-CM

## 2018-10-01 NOTE — Progress Notes (Signed)
FOLLOW UP  Date of Service/Encounter:  10/01/18   Assessment:   Anaphylactic shock due to food (? alpha gal)  Chronic rhinitis (grasses, molds, cat) - with very mild reactivity on skin testing  Anaphylaxis to medication additive (magnesium stearate)   Samantha Chambers presents for follow-up visit.  While she has been avoiding red meats as much as she can, she has had a few accidental ingestions.  These have not led to any reactions.  She has had one reaction since the last visit, she attributes this to a medication that contain magnesium stearate.  Since last visit, she looked at all of the medications which were associated with reactions and it seems that they all contain this additive.  She has been avoiding this additive and reading labels carefully and this seems to have decreased her incidence of reactions.  Typically allergens need to be a certain size before they can cause an allergic reaction.  I would assume that magnesium stearate is well below that threshold.  Review of the literature shows only case reports of any reactions to magnesium stearate, but I did recommend that she continue to avoid it since she has done so well with this.  Her alpha gal IgE was so low and she has had many accidental ingestions without a reaction, therefore I recommended that she go ahead and introduce this ad lib.  She does have an EpiPen available if needed and we did review indications of how and when to use it.  Plan/Recommendations:   1. Anaphylactic shock due to food (maybe!)  - Your alpha gal IgE was so low that I am not certain that this is related to your allergic reactions. - On the other hand, reactions to elements and small molecules (such as magnesium stearate) are very rare.  - I would just go ahead and introduce red meat back into your diet. - You have your EpiPen if needed. - Try to avoid the magnesium stearate in your medications that you take.  - Call us with updates.  2. Chronic  rhinitis - Testing today showed: grasses, indoor molds, outdoor molds and cat (but these were not extremely reactive) - Copy of test results provided.  - Avoidance measures provided. - Start taking: Nasacort (triamcinolone) one spray per nostril daily - You can use an extra dose of the antihistamine, if needed, for breakthrough symptoms.  - Consider nasal saline rinses 1-2 times daily to remove allergens from the nasal cavities as well as help with mucous clearance (this is especially helpful to do before the nasal sprays are given)  3. Return in about 6 months (around 04/03/2019). This can be an in-person follow up visit.  Subjective:   Samantha Chambers is a 48 y.o. female presenting today for follow up of  Chief Complaint  Patient presents with  . Food Intolerance    Samantha Chambers has a history of the following: Patient Active Problem List   Diagnosis Date Noted  . Bipolar 1 disorder, mixed, moderate ([place]) 02/13/2013  . Generalized anxiety disorder 02/13/2013    History obtained from: chart review and patient.  Samantha Chambers is a 48 y.o. female presenting for a follow up visit.  She was last seen in February 2020 for evaluation of multiple allergic reactions.  We did do testing to the entire environmental panel as well as the most common foods.  These were all negative, but her positive control was lackluster at best.  We obtained blood work instead that was completely  negative aside from an alpha gal panel that showed an IgE to alpha gal of 0.16.  We recommended avoiding red meat, but she has continued to adopt her diagnosis fully.  We discussed performing intradermal testing as well since this is more sensitive, and this is 1 of the reasons that she comes in today  Since last visit, she has done fairly well.  She did have one reaction which prompted her to look at all of the medications she was using.  These include essentially over-the-counter and holistic medications.  She noticed  that the melatonin that she reacted to on a couple of occasions contained compound named magnesium stearate.  She also noticed that the diclofenac ointment which she was using for pain control contain magnesium stearate.  She talked to a pharmacist who told her that this is a common additive in multiple medications.  She has since been reading labels very carefully and avoiding it at all cost.  While she has been avoiding red meat, she has had several accidental ingestions.  These include pepperoni as well as other items which she was not aware contained red meat.  She has been avoiding the obvious sources including steak and ground beef.  She has since turned to ground chicken which is a good enough substitute for the ground beef.  She does miss the occasional steak and was thinking about trying out emu to see if this is a good substitute.  She joined a Facebook group of other patients with alpha gal and she is somewhat fed up with them today.  She tells me that she rarely post, but posted today to get some good wishes for her allergy appointment.  However, instead of encouragement, she got the opinions of "lots of Karens". She is thinking of leaving the group and tells me that they all assume that they have MD's behind their names.   Otherwise, there have been no changes to her past medical history, surgical history, family history, or social history.    Review of Systems  Constitutional: Negative.  Negative for fever, malaise/fatigue and weight loss.  HENT: Negative.  Negative for congestion, ear discharge and ear pain.   Eyes: Negative for pain, discharge and redness.  Respiratory: Negative for cough, sputum production, shortness of breath and wheezing.   Cardiovascular: Negative.  Negative for chest pain and palpitations.  Gastrointestinal: Negative for abdominal pain, heartburn, nausea and vomiting.  Skin: Negative.  Negative for itching and rash.  Neurological: Negative for dizziness and  headaches.  Endo/Heme/Allergies: Negative for environmental allergies. Does not bruise/bleed easily.       Objective:   Blood pressure 102/72, pulse (!) 116, temperature 98.6 F (37 C), temperature source Tympanic, resp. rate 18, SpO2 98 %. There is no height or weight on file to calculate BMI.   Physical Exam:  Physical Exam  Constitutional: She appears well-developed.  Very talkative female.  HENT:  Head: Normocephalic and atraumatic.  Right Ear: Tympanic membrane, external ear and ear canal normal.  Left Ear: Tympanic membrane, external ear and ear canal normal.  Nose: Mucosal edema present. No rhinorrhea, nasal deformity or septal deviation. No epistaxis. Right sinus exhibits no maxillary sinus tenderness and no frontal sinus tenderness. Left sinus exhibits no maxillary sinus tenderness and no frontal sinus tenderness.  Mouth/Throat: Uvula is midline and oropharynx is clear and moist. Mucous membranes are not pale and not dry.  Eyes: Pupils are equal, round, and reactive to light. Conjunctivae and EOM are normal. Right eye  exhibits no chemosis and no discharge. Left eye exhibits no chemosis and no discharge. Right conjunctiva is not injected. Left conjunctiva is not injected.  Cardiovascular: Normal rate, regular rhythm and normal heart sounds.  Respiratory: Effort normal and breath sounds normal. No accessory muscle usage. No tachypnea. No respiratory distress. She has no wheezes. She has no rhonchi. She has no rales. She exhibits no tenderness.  Lymphadenopathy:    She has no cervical adenopathy.  Neurological: She is alert.  Skin: No abrasion, no petechiae and no rash noted. Rash is not papular, not vesicular and not urticarial. No erythema. No pallor.  No eczema or urticaria noted.  Psychiatric: She has a normal mood and affect.     Diagnostic studies:     Allergy Studies:    Intradermal - 10/01/18 1141    Time Antigen Placed  1141    Allergen Manufacturer  Lavella Hammock     Location  Arm    Number of Test  15    Intradermal  Select    Control  Negative    [place]  Negative    [place]  1+    7 Grass  Negative    Ragweed mix  Negative    Weed mix  Negative    Tree mix  Negative    Mold 1  Negative    Mold 2  Negative    Mold 3  1+    Mold 4  Negative    Cat  1+    Dog  Negative    Cockroach  Negative    Mite mix  Negative       Allergy testing results were read and interpreted by myself, documented by clinical staff.      Salvatore Marvel, MD  [facility]

## 2018-10-01 NOTE — Patient Instructions (Addendum)
1. Anaphylactic shock due to food (maybe!)  - Your alpha gal IgE was so low that I am not certain that this is related to your allergic reactions. - On the other hand, reactions to elements and small molecules (such as magnesium stearate) are very rare.  - I would just go ahead and introduce red meat back into your diet. - You have your EpiPen if needed. - Try to avoid the magnesium stearate in your medications that you take.  - Call us with updates.  2. Chronic rhinitis - Testing today showed: grasses, indoor molds, outdoor molds and cat (but these were not extremely reactive) - Copy of test results provided.  - Avoidance measures provided. - Start taking: Nasacort (triamcinolone) one spray per nostril daily - You can use an extra dose of the antihistamine, if needed, for breakthrough symptoms.  - Consider nasal saline rinses 1-2 times daily to remove allergens from the nasal cavities as well as help with mucous clearance (this is especially helpful to do before the nasal sprays are given)  3. Return in about 6 months (around 04/03/2019). This can be an in-person follow up visit.   Please inform us of any Emergency Department visits, hospitalizations, or changes in symptoms. Call us before going to the ED for breathing or allergy symptoms since we might be able to fit you in for a sick visit. Feel free to contact us anytime with any questions, problems, or concerns.  It was a pleasure to see you again today! Get those Karens out of your life!   Websites that have reliable patient information: 1. American Academy of Asthma, Allergy, and Immunology: www.aaaai.org 2. Food Allergy Research and Education (FARE): foodallergy.org 3. Mothers of Asthmatics: http://www.asthmacommunitynetwork.org 4. American College of Allergy, Asthma, and Immunology: www.acaai.org  "Like" [place] on Facebook and Instagram for our latest updates!      Make sure you are registered to vote! If you have moved or  changed any of your contact information, you will need to get this updated before voting!    Voter ID laws are NOT going into effect for the General Election in November 2020! DO NOT let this stop you from exercising your right to vote!    Reducing Pollen Exposure  The American Academy of Allergy, Asthma and Immunology suggests the following steps to reduce your exposure to pollen during allergy seasons.    1. Do not hang sheets or clothing out to dry; pollen may collect on these items. 2. Do not mow lawns or spend time around freshly cut grass; mowing stirs up pollen. 3. Keep windows closed at night.  Keep car windows closed while driving. 4. Minimize morning activities outdoors, a time when pollen counts are usually at their highest. 5. Stay indoors as much as possible when pollen counts or humidity is high and on windy days when pollen tends to remain in the air longer. 6. Use air conditioning when possible.  Many air conditioners have filters that trap the pollen spores. 7. Use a HEPA room air filter to remove pollen form the indoor air you breathe.  Control of Mold Allergen   Mold and fungi can grow on a variety of surfaces provided certain temperature and moisture conditions exist.  Outdoor molds grow on plants, decaying vegetation and soil.  The major outdoor mold, Alternaria and Cladosporium, are found in very high numbers during hot and dry conditions.  Generally, a late Summer - Fall peak is seen for common outdoor fungal spores.  Rain  will temporarily lower outdoor mold spore count, but counts rise rapidly when the rainy period ends.  The most important indoor molds are Aspergillus and Penicillium.  Dark, humid and poorly ventilated basements are ideal sites for mold growth.  The next most common sites of mold growth are the bathroom and the kitchen.  Outdoor (Seasonal) Mold Control   1. Use air conditioning and keep windows closed 2. Avoid exposure to decaying vegetation. 3.  Avoid leaf raking. 4. Avoid grain handling. 5. Consider wearing a face mask if working in moldy areas.   Indoor (Perennial) Mold Control  1. Maintain humidity below 50%. 2. Clean washable surfaces with 5% bleach solution. 3. Remove sources e.g. contaminated carpets.     Control of Dog or Cat Allergen  Avoidance is the best way to manage a dog or cat allergy. If you have a dog or cat and are allergic to dog or cats, consider removing the dog or cat from the home. If you have a dog or cat but don't want to find it a new home, or if your family wants a pet even though someone in the household is allergic, here are some strategies that may help keep symptoms at bay:  1. Keep the pet out of your bedroom and restrict it to only a few rooms. Be advised that keeping the dog or cat in only one room will not limit the allergens to that room. 2. Don't pet, hug or kiss the dog or cat; if you do, wash your hands with soap and water. 3. High-efficiency particulate air (HEPA) cleaners run continuously in a bedroom or living room can reduce allergen levels over time. 4. Regular use of a high-efficiency vacuum cleaner or a central vacuum can reduce allergen levels. 5. Giving your dog or cat a bath at least once a week can reduce airborne allergen.

## 2018-10-02 DIAGNOSIS — Z1211 Encounter for screening for malignant neoplasm of colon: Secondary | ICD-10-CM | POA: Diagnosis not present

## 2018-10-02 DIAGNOSIS — Z0000 Encounter for general adult medical examination without abnormal findings: Secondary | ICD-10-CM | POA: Diagnosis not present

## 2018-10-02 DIAGNOSIS — Z7189 Other specified counseling: Secondary | ICD-10-CM | POA: Diagnosis not present

## 2018-10-02 DIAGNOSIS — Z1339 Encounter for screening examination for other mental health and behavioral disorders: Secondary | ICD-10-CM | POA: Diagnosis not present

## 2018-10-02 DIAGNOSIS — M7712 Lateral epicondylitis, left elbow: Secondary | ICD-10-CM | POA: Diagnosis not present

## 2018-10-02 DIAGNOSIS — F319 Bipolar disorder, unspecified: Secondary | ICD-10-CM | POA: Diagnosis not present

## 2018-10-02 DIAGNOSIS — Z299 Encounter for prophylactic measures, unspecified: Secondary | ICD-10-CM | POA: Diagnosis not present

## 2018-10-02 DIAGNOSIS — Z6824 Body mass index (BMI) 24.0-24.9, adult: Secondary | ICD-10-CM | POA: Diagnosis not present

## 2018-10-02 DIAGNOSIS — Z1331 Encounter for screening for depression: Secondary | ICD-10-CM | POA: Diagnosis not present

## 2018-10-14 ENCOUNTER — Telehealth (HOSPITAL_COMMUNITY): Payer: Self-pay | Admitting: *Deleted

## 2018-10-14 NOTE — Telephone Encounter (Signed)
Yes, that's fine 

## 2018-10-14 NOTE — Telephone Encounter (Signed)
Dr Harrington Challenger Patient has called stating the "Olanzapine was changed to Vraylar" & that during the daytime she's fine BUT @ night she's not sleeping. And that she had som left over Ambien she had taken & asked if she could go on Ambien ER? Next appt is 12-01-2018

## 2018-10-15 ENCOUNTER — Other Ambulatory Visit (HOSPITAL_COMMUNITY): Payer: Self-pay | Admitting: Psychiatry

## 2018-10-15 DIAGNOSIS — E559 Vitamin D deficiency, unspecified: Secondary | ICD-10-CM | POA: Diagnosis not present

## 2018-10-15 DIAGNOSIS — F419 Anxiety disorder, unspecified: Secondary | ICD-10-CM | POA: Diagnosis not present

## 2018-10-15 DIAGNOSIS — Z79899 Other long term (current) drug therapy: Secondary | ICD-10-CM | POA: Diagnosis not present

## 2018-10-15 DIAGNOSIS — R5383 Other fatigue: Secondary | ICD-10-CM | POA: Diagnosis not present

## 2018-10-15 DIAGNOSIS — E7800 Pure hypercholesterolemia, unspecified: Secondary | ICD-10-CM | POA: Diagnosis not present

## 2018-10-15 MED ORDER — ZOLPIDEM TARTRATE ER 12.5 MG PO TBCR: 12.5000 mg | EXTENDED_RELEASE_TABLET | Freq: Every evening | ORAL | 1 refills | Status: DC | PRN

## 2018-10-15 NOTE — Telephone Encounter (Signed)
Dr Harrington Challenger Will you be ordering Ambien Er for [place]?

## 2018-10-15 NOTE — Telephone Encounter (Signed)
sent 

## 2018-10-17 ENCOUNTER — Other Ambulatory Visit (HOSPITAL_COMMUNITY): Payer: Self-pay | Admitting: Psychiatry

## 2018-10-17 ENCOUNTER — Telehealth (HOSPITAL_COMMUNITY): Payer: Self-pay | Admitting: *Deleted

## 2018-10-17 MED ORDER — BELSOMRA 20 MG PO TABS: 20.0000 mg | ORAL_TABLET | Freq: Every day | ORAL | 2 refills | Status: DC

## 2018-10-17 NOTE — Telephone Encounter (Signed)
Dr Harrington Challenger Patient called & stated that she went to pick the Ambien Er  & was told her insurance will not cover. She called her insurance company  & they will cover [place]. She's now requesting a script for [facility]

## 2018-10-17 NOTE — Telephone Encounter (Signed)
sent 

## 2018-10-21 DIAGNOSIS — M9902 Segmental and somatic dysfunction of thoracic region: Secondary | ICD-10-CM | POA: Diagnosis not present

## 2018-10-21 DIAGNOSIS — S134XXA Sprain of ligaments of cervical spine, initial encounter: Secondary | ICD-10-CM | POA: Diagnosis not present

## 2018-10-21 DIAGNOSIS — S233XXA Sprain of ligaments of thoracic spine, initial encounter: Secondary | ICD-10-CM | POA: Diagnosis not present

## 2018-10-21 DIAGNOSIS — S338XXA Sprain of other parts of lumbar spine and pelvis, initial encounter: Secondary | ICD-10-CM | POA: Diagnosis not present

## 2018-10-21 DIAGNOSIS — M9901 Segmental and somatic dysfunction of cervical region: Secondary | ICD-10-CM | POA: Diagnosis not present

## 2018-10-21 DIAGNOSIS — M9903 Segmental and somatic dysfunction of lumbar region: Secondary | ICD-10-CM | POA: Diagnosis not present

## 2018-12-01 ENCOUNTER — Encounter (HOSPITAL_COMMUNITY): Payer: Self-pay | Admitting: Psychiatry

## 2018-12-01 ENCOUNTER — Other Ambulatory Visit: Payer: Self-pay

## 2018-12-01 ENCOUNTER — Ambulatory Visit (INDEPENDENT_AMBULATORY_CARE_PROVIDER_SITE_OTHER): Payer: Medicare HMO | Admitting: Psychiatry

## 2018-12-01 DIAGNOSIS — F3162 Bipolar disorder, current episode mixed, moderate: Secondary | ICD-10-CM

## 2018-12-01 MED ORDER — AMPHETAMINE-DEXTROAMPHETAMINE 15 MG PO TABS: ORAL_TABLET | ORAL | 0 refills | Status: DC

## 2018-12-01 MED ORDER — AMITRIPTYLINE HCL 100 MG PO TABS: 100.0000 mg | ORAL_TABLET | Freq: Every day | ORAL | 2 refills | Status: DC

## 2018-12-01 MED ORDER — OLANZAPINE 2.5 MG PO TABS: 2.5000 mg | ORAL_TABLET | Freq: Every day | ORAL | 2 refills | Status: DC

## 2018-12-01 MED ORDER — ALPRAZOLAM 1 MG PO TABS: 1.0000 mg | ORAL_TABLET | Freq: Four times a day (QID) | ORAL | 2 refills | Status: DC

## 2018-12-01 MED ORDER — AMPHETAMINE-DEXTROAMPHET ER 15 MG PO CP24: ORAL_CAPSULE | ORAL | 0 refills | Status: DC
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
## 2018-12-02 DIAGNOSIS — F319 Bipolar disorder, unspecified: Secondary | ICD-10-CM | POA: Diagnosis not present

## 2018-12-02 DIAGNOSIS — M79606 Pain in leg, unspecified: Secondary | ICD-10-CM | POA: Diagnosis not present

## 2018-12-02 DIAGNOSIS — Z6824 Body mass index (BMI) 24.0-24.9, adult: Secondary | ICD-10-CM | POA: Diagnosis not present

## 2018-12-02 DIAGNOSIS — M7711 Lateral epicondylitis, right elbow: Secondary | ICD-10-CM | POA: Diagnosis not present

## 2018-12-02 DIAGNOSIS — Z299 Encounter for prophylactic measures, unspecified: Secondary | ICD-10-CM | POA: Diagnosis not present

## 2019-01-06 DIAGNOSIS — Z1231 Encounter for screening mammogram for malignant neoplasm of breast: Secondary | ICD-10-CM | POA: Diagnosis not present

## 2019-01-23 ENCOUNTER — Telehealth (HOSPITAL_COMMUNITY): Payer: Self-pay | Admitting: *Deleted

## 2019-01-23 NOTE — Telephone Encounter (Signed)
[facility] REVIEW APPROVED PRESCRIPTION DRUG COVERAGE  ADDERALL XR 15 MG CAPSULE      AUTHORIZATION GOOD UNTIL 05/07/2019

## 2019-02-02 ENCOUNTER — Ambulatory Visit (HOSPITAL_COMMUNITY): Payer: Medicare HMO | Admitting: Psychiatry

## 2019-02-03 ENCOUNTER — Ambulatory Visit (INDEPENDENT_AMBULATORY_CARE_PROVIDER_SITE_OTHER): Payer: Medicare HMO | Admitting: Psychiatry

## 2019-02-03 ENCOUNTER — Encounter (HOSPITAL_COMMUNITY): Payer: Self-pay | Admitting: Psychiatry

## 2019-02-03 ENCOUNTER — Other Ambulatory Visit: Payer: Self-pay

## 2019-02-03 DIAGNOSIS — F3162 Bipolar disorder, current episode mixed, moderate: Secondary | ICD-10-CM | POA: Diagnosis not present

## 2019-02-03 MED ORDER — AMPHETAMINE-DEXTROAMPHETAMINE 15 MG PO TABS: ORAL_TABLET | ORAL | 0 refills | Status: DC

## 2019-02-03 MED ORDER — AMPHETAMINE-DEXTROAMPHET ER 15 MG PO CP24: ORAL_CAPSULE | ORAL | 0 refills | Status: DC

## 2019-02-03 MED ORDER — OLANZAPINE 2.5 MG PO TABS: 2.5000 mg | ORAL_TABLET | Freq: Every day | ORAL | 2 refills | Status: DC

## 2019-02-03 MED ORDER — AMITRIPTYLINE HCL 100 MG PO TABS: 100.0000 mg | ORAL_TABLET | Freq: Every day | ORAL | 2 refills | Status: DC

## 2019-02-03 MED ORDER — AMITRIPTYLINE HCL 25 MG PO TABS: 25.0000 mg | ORAL_TABLET | Freq: Every day | ORAL | 2 refills | Status: DC
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
## 2019-02-06 DIAGNOSIS — Z23 Encounter for immunization: Secondary | ICD-10-CM | POA: Diagnosis not present

## 2019-02-09 DIAGNOSIS — H5203 Hypermetropia, bilateral: Secondary | ICD-10-CM | POA: Diagnosis not present

## 2019-02-09 DIAGNOSIS — H524 Presbyopia: Secondary | ICD-10-CM | POA: Diagnosis not present

## 2019-02-13 DIAGNOSIS — R221 Localized swelling, mass and lump, neck: Secondary | ICD-10-CM | POA: Diagnosis not present

## 2019-02-13 DIAGNOSIS — Z299 Encounter for prophylactic measures, unspecified: Secondary | ICD-10-CM | POA: Diagnosis not present

## 2019-02-13 DIAGNOSIS — Z6824 Body mass index (BMI) 24.0-24.9, adult: Secondary | ICD-10-CM | POA: Diagnosis not present

## 2019-02-13 DIAGNOSIS — F319 Bipolar disorder, unspecified: Secondary | ICD-10-CM | POA: Diagnosis not present

## 2019-02-23 DIAGNOSIS — E041 Nontoxic single thyroid nodule: Secondary | ICD-10-CM | POA: Diagnosis not present

## 2019-02-23 DIAGNOSIS — E049 Nontoxic goiter, unspecified: Secondary | ICD-10-CM | POA: Diagnosis not present

## 2019-02-24 ENCOUNTER — Other Ambulatory Visit (HOSPITAL_COMMUNITY): Payer: Self-pay | Admitting: Psychiatry

## 2019-02-24 ENCOUNTER — Telehealth (HOSPITAL_COMMUNITY): Payer: Self-pay | Admitting: *Deleted

## 2019-02-24 MED ORDER — AMPHETAMINE-DEXTROAMPHET ER 15 MG PO CP24: ORAL_CAPSULE | ORAL | 0 refills | Status: DC

## 2019-02-24 NOTE — Telephone Encounter (Signed)
sent 

## 2019-02-24 NOTE — Telephone Encounter (Signed)
Patient called stated that [facility] seems to always have Ad[facility] unavailable  & requested to have [facility] added to her Rx list. Spoke with [facility] & her Ad[facility] had to be ordered / not in stock. Patient request refill be sent to [facility]

## 2019-02-26 ENCOUNTER — Other Ambulatory Visit (HOSPITAL_COMMUNITY): Payer: Self-pay | Admitting: Psychiatry

## 2019-03-06 DIAGNOSIS — R070 Pain in throat: Secondary | ICD-10-CM | POA: Diagnosis not present

## 2019-03-06 DIAGNOSIS — K219 Gastro-esophageal reflux disease without esophagitis: Secondary | ICD-10-CM | POA: Diagnosis not present

## 2019-03-24 DIAGNOSIS — Z01419 Encounter for gynecological examination (general) (routine) without abnormal findings: Secondary | ICD-10-CM | POA: Diagnosis not present

## 2019-03-24 DIAGNOSIS — Z1330 Encounter for screening examination for mental health and behavioral disorders, unspecified: Secondary | ICD-10-CM | POA: Diagnosis not present

## 2019-04-06 ENCOUNTER — Other Ambulatory Visit: Payer: Self-pay

## 2019-04-06 ENCOUNTER — Encounter (HOSPITAL_COMMUNITY): Payer: Self-pay | Admitting: Psychiatry

## 2019-04-06 ENCOUNTER — Ambulatory Visit (INDEPENDENT_AMBULATORY_CARE_PROVIDER_SITE_OTHER): Payer: Medicare HMO | Admitting: Psychiatry

## 2019-04-06 DIAGNOSIS — F3162 Bipolar disorder, current episode mixed, moderate: Secondary | ICD-10-CM

## 2019-04-06 MED ORDER — ADDERALL XR 15 MG PO CP24: ORAL_CAPSULE | ORAL | 0 refills | Status: DC

## 2019-04-06 MED ORDER — AMITRIPTYLINE HCL 25 MG PO TABS: ORAL_TABLET | ORAL | 1 refills | Status: DC

## 2019-04-06 MED ORDER — AMITRIPTYLINE HCL 100 MG PO TABS: 100.0000 mg | ORAL_TABLET | Freq: Every day | ORAL | 2 refills | Status: DC

## 2019-04-06 MED ORDER — OLANZAPINE 2.5 MG PO TABS: 2.5000 mg | ORAL_TABLET | Freq: Every day | ORAL | 2 refills | Status: DC

## 2019-04-06 MED ORDER — ALPRAZOLAM 1 MG PO TABS: 1.0000 mg | ORAL_TABLET | Freq: Four times a day (QID) | ORAL | 2 refills | Status: DC
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
## 2019-04-08 ENCOUNTER — Ambulatory Visit: Payer: Medicare HMO | Admitting: Allergy & Immunology

## 2019-04-16 ENCOUNTER — Telehealth (HOSPITAL_COMMUNITY): Payer: Self-pay | Admitting: *Deleted

## 2019-04-16 ENCOUNTER — Telehealth (HOSPITAL_COMMUNITY): Payer: Self-pay | Admitting: Psychiatry
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
## 2019-04-22 ENCOUNTER — Ambulatory Visit (INDEPENDENT_AMBULATORY_CARE_PROVIDER_SITE_OTHER): Payer: Medicare HMO | Admitting: Clinical

## 2019-04-22 ENCOUNTER — Other Ambulatory Visit: Payer: Self-pay

## 2019-04-22 DIAGNOSIS — F3162 Bipolar disorder, current episode mixed, moderate: Secondary | ICD-10-CM
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
## 2019-05-07 DIAGNOSIS — H524 Presbyopia: Secondary | ICD-10-CM | POA: Diagnosis not present

## 2019-05-07 DIAGNOSIS — H5203 Hypermetropia, bilateral: Secondary | ICD-10-CM | POA: Diagnosis not present

## 2019-05-21 ENCOUNTER — Other Ambulatory Visit: Payer: Self-pay

## 2019-05-21 ENCOUNTER — Ambulatory Visit (INDEPENDENT_AMBULATORY_CARE_PROVIDER_SITE_OTHER): Payer: Medicare HMO | Admitting: Clinical

## 2019-05-21 DIAGNOSIS — F411 Generalized anxiety disorder: Secondary | ICD-10-CM | POA: Diagnosis not present

## 2019-05-21 DIAGNOSIS — F3162 Bipolar disorder, current episode mixed, moderate: Secondary | ICD-10-CM

## 2019-05-21 NOTE — Progress Notes (Signed)
Virtual Visit via Video Note  I connected with Samantha Chambers on 05/21/19 at 10:00 AM EST by a video enabled telemedicine application and verified that I am speaking with the correct person using two identifiers.  Location: Patient: Home Provider: Office   I discussed the limitations of evaluation and management by telemedicine and the availability of in person appointments. The patient expressed understanding and agreed to proceed.       THERAPIST PROGRESS NOTE  Session Time: 10:00AM-10:55AM  Participation Level: Active  Behavioral Response: CasualAlertDepressed  Type of Therapy: Individual Therapy  Treatment Goals addressed: Coping  Interventions: CBT and Solution Focused  Summary: Samantha Chambers is a 49 y.o. female who presents with Bi-Polar and Generalized Anxiety disorders. The OPT therapist worked with the client for her scheduled session. The OPT therapist utilized Motivational Interviewing to assist in creating therapeutic repore. The patient in the session was engaged and work in collaboration giving feedback about her triggers and symptoms over the past few weeks. The patient focused on her daughters recent self harm and hospitalization episodeThe OPT therapist utilized Public relations account executive Therapy through cognitive restructuring as well as worked with the patient on solution focused approaches.   Suicidal/Homicidal: Nowithout intent/plan  Therapist Response: The OPT therapist worked with the patient for the patients scheduled session. The patient was engaged in her session and gave feedback in relation to triggers, symptoms, and behavior responses over the past 2 weeks. The OPT therapist worked with the patient utilizing an in session Cognitive Behavioral Therapy exercise and implementing Solution Focused Approach. The patient was responsive in the session and verbalized, " I am implementing changes to protect my daughter and I am aware of the impact her situation is  having on me with my Bi-polar". The OPT therapist will continue treatment work with the patient in her next scheduled session.  Plan: Return again in 2/3 weeks.  Diagnosis: Axis I: Bipolar, Depressed and Generalized Anxiety Disorder    Axis II: No diagnosis  I discussed the assessment and treatment plan with the patient. The patient was provided an opportunity to ask questions and all were answered. The patient agreed with the plan and demonstrated an understanding of the instructions.   The patient was advised to call back or seek an in-person evaluation if the symptoms worsen or if the condition fails to improve as anticipated.  I provided 55 minutes of non-face-to-face time during this encounter.  Lennox Grumbles, LCSW 05/21/2019

## 2019-06-01 DIAGNOSIS — R070 Pain in throat: Secondary | ICD-10-CM | POA: Diagnosis not present

## 2019-06-01 DIAGNOSIS — K219 Gastro-esophageal reflux disease without esophagitis: Secondary | ICD-10-CM | POA: Diagnosis not present

## 2019-06-08 ENCOUNTER — Other Ambulatory Visit: Payer: Self-pay

## 2019-06-08 ENCOUNTER — Ambulatory Visit (INDEPENDENT_AMBULATORY_CARE_PROVIDER_SITE_OTHER): Payer: Medicare HMO | Admitting: Psychiatry

## 2019-06-08 ENCOUNTER — Encounter (HOSPITAL_COMMUNITY): Payer: Self-pay | Admitting: Psychiatry

## 2019-06-08 DIAGNOSIS — F900 Attention-deficit hyperactivity disorder, predominantly inattentive type: Secondary | ICD-10-CM

## 2019-06-08 DIAGNOSIS — F411 Generalized anxiety disorder: Secondary | ICD-10-CM

## 2019-06-08 DIAGNOSIS — F3162 Bipolar disorder, current episode mixed, moderate: Secondary | ICD-10-CM | POA: Diagnosis not present

## 2019-06-08 MED ORDER — ADDERALL XR 15 MG PO CP24: ORAL_CAPSULE | ORAL | 0 refills | Status: DC

## 2019-06-08 MED ORDER — AMITRIPTYLINE HCL 25 MG PO TABS: ORAL_TABLET | ORAL | 1 refills | Status: DC

## 2019-06-08 MED ORDER — AMITRIPTYLINE HCL 100 MG PO TABS: 100.0000 mg | ORAL_TABLET | Freq: Every day | ORAL | 2 refills | Status: DC

## 2019-06-08 MED ORDER — OLANZAPINE 2.5 MG PO TABS: 2.5000 mg | ORAL_TABLET | Freq: Every day | ORAL | 2 refills | Status: DC

## 2019-06-08 MED ORDER — ALPRAZOLAM 1 MG PO TABS: 1.0000 mg | ORAL_TABLET | Freq: Four times a day (QID) | ORAL | 2 refills | Status: DC
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
## 2019-06-10 ENCOUNTER — Ambulatory Visit (INDEPENDENT_AMBULATORY_CARE_PROVIDER_SITE_OTHER): Payer: Medicare HMO | Admitting: Clinical

## 2019-06-10 ENCOUNTER — Other Ambulatory Visit: Payer: Self-pay

## 2019-06-10 DIAGNOSIS — F3162 Bipolar disorder, current episode mixed, moderate: Secondary | ICD-10-CM | POA: Diagnosis not present

## 2019-06-10 NOTE — Progress Notes (Signed)
Virtual Visit via Video Note  I connected with Samantha Chambers on 06/10/19 at  9:00 AM EST by a video enabled telemedicine application and verified that I am speaking with the correct person using two identifiers.  Location: Patient: Home Provider: Office   I discussed the limitations of evaluation and management by telemedicine and the availability of in person appointments. The patient expressed understanding and agreed to proceed.       THERAPIST PROGRESS NOTE  Session Time: 9:00AM-9:45AM  Participation Level: Active  Behavioral Response: CasualAlertDepressed  Type of Therapy: Individual Therapy  Treatment Goals addressed: Anxiety  Interventions: CBT  Summary: Samantha Chambers is a 49 y.o. female who presents with Bi-Polar Disorder. The OPT therapist worked with the client for her scheduled session. The OPT therapist utilized Motivational Interviewing to assist in creating therapeutic repore. The patient in the session was engaged and work in collaboration giving feedback about her triggers and symptoms over the past few weeks including the ongoing progression of her daughters mental health difficulty. The patient continues to identify her current stressor as her daughters ongoing symptomatic behaviors in the home. The OPT therapist utilized Cognitive Behavioral Therapy through cognitive restructuring as well as worked with the patient on solution focused approaches.  Suicidal/Homicidal: Nowithout intent/plan  Therapist Response: The OPT therapist worked with the patient for the patients scheduled session. The patient was engaged in her session and gave feedback in relation to triggers, symptoms, and behavior responses over the past 2 weeks. The OPT therapist worked with the patient utilizing an in session Cognitive Behavioral Therapy exercise and implementing Solution Focused Approach. The patient was responsive in the session and verbalized, " I am going to focus on being more  aware of my mood and implementing self care as needed". The OPT therapist will continue treatment work with the patient in her next scheduled session.  Plan: Return again in 2 weeks.  Diagnosis: Axis I: Bipolar, Depressed and Generalized Anxiety Disorder    Axis II: No diagnosis  I discussed the assessment and treatment plan with the patient. The patient was provided an opportunity to ask questions and all were answered. The patient agreed with the plan and demonstrated an understanding of the instructions.   The patient was advised to call back or seek an in-person evaluation if the symptoms worsen or if the condition fails to improve as anticipated.  I provided 45 minutes of non-face-to-face time during this encounter.   Lennox Grumbles, LCSW 06/10/2019

## 2019-06-29 ENCOUNTER — Ambulatory Visit (INDEPENDENT_AMBULATORY_CARE_PROVIDER_SITE_OTHER): Payer: Medicare HMO | Admitting: Clinical

## 2019-06-29 ENCOUNTER — Other Ambulatory Visit: Payer: Self-pay

## 2019-06-29 DIAGNOSIS — F3162 Bipolar disorder, current episode mixed, moderate: Secondary | ICD-10-CM | POA: Diagnosis not present

## 2019-06-29 DIAGNOSIS — F411 Generalized anxiety disorder: Secondary | ICD-10-CM

## 2019-06-29 NOTE — Progress Notes (Signed)
Virtual Visit via Video Note  I connected with Samantha Chambers on 06/29/19 at 10:00 AM EST by a video enabled telemedicine application and verified that I am speaking with the correct person using two identifiers.  Location: Patient: Home Provider: Office    I discussed the limitations of evaluation and management by telemedicine and the availability of in person appointments. The patient expressed understanding and agreed to proceed.      THERAPIST PROGRESS NOTE  Session Time: 10:00AM-10:45AM  Participation Level: Active  Behavioral Response: CasualAlertDepressed  Type of Therapy: Individual Therapy  Treatment Goals addressed: Coping  Interventions: CBT  Summary: Samantha Chambers is a 49 y.o. female who presents with Bipolar and Anxiety. The OPT therapist worked with the patient for herscheduledsession. The OPT therapist utilized Motivational Interviewing to assist in creating therapeutic repore. The patient in the session was engaged and work in Science writer about hertriggers and symptoms over the past few weeks including the ongoing stress of supervising her daughter who has been having behavioral outburst and prior crisis. The OPT therapist utilized Cognitive Behavioral Therapy through cognitive restructuring as well as worked with the patient on using her existing support system and implementing self care to help balance her current stressors.  Suicidal/Homicidal: Nowithout intent/plan  Therapist Response: The OPT therapist worked with the patient for the patients scheduled session. The patient was engaged in hersession and gave feedback in relation to triggers, symptoms, and behavior responses over the past few weeks. The OPT therapist worked with the patient utilizing an in session Cognitive Behavioral Therapy exerciseand implementing Solution Focused Approach. The patient was responsive in the session and verbalized, " I am going to try to keep planning  for myself I have a spa date set and I am hoping soon my parents will be able to help with the supervision for my daughter so I can have some socialization with friends". The OPT therapist will continue treatment work with the patient in hernext scheduled session.  Plan: Return again in 3 weeks.  Diagnosis: Axis I: Bipolar, Depressed and Generalized Anxiety Disorder    Axis II: No diagnosis  I discussed the assessment and treatment plan with the patient. The patient was provided an opportunity to ask questions and all were answered. The patient agreed with the plan and demonstrated an understanding of the instructions.   The patient was advised to call back or seek an in-person evaluation if the symptoms worsen or if the condition fails to improve as anticipated.  I provided 45 minutes of non-face-to-face time during this encounter.  Lennox Grumbles, LCSW 06/29/2019

## 2019-07-05 DIAGNOSIS — K219 Gastro-esophageal reflux disease without esophagitis: Secondary | ICD-10-CM | POA: Diagnosis not present

## 2019-07-05 DIAGNOSIS — M791 Myalgia, unspecified site: Secondary | ICD-10-CM | POA: Diagnosis not present

## 2019-07-23 ENCOUNTER — Other Ambulatory Visit: Payer: Self-pay

## 2019-07-23 ENCOUNTER — Ambulatory Visit (INDEPENDENT_AMBULATORY_CARE_PROVIDER_SITE_OTHER): Payer: Medicare HMO | Admitting: Clinical

## 2019-07-23 DIAGNOSIS — F3162 Bipolar disorder, current episode mixed, moderate: Secondary | ICD-10-CM

## 2019-07-23 DIAGNOSIS — F411 Generalized anxiety disorder: Secondary | ICD-10-CM

## 2019-07-23 NOTE — Progress Notes (Signed)
Virtual Visit via Telephone Note  I connected with Samantha Chambers on 07/23/19 at 11:00 AM EDT by telephone and verified that I am speaking with the correct person using two identifiers.  Location: Patient: Home Provider: Office   I discussed the limitations, risks, security and privacy concerns of performing an evaluation and management service by telephone and the availability of in person appointments. I also discussed with the patient that there may be a patient responsible charge related to this service. The patient expressed understanding and agreed to proceed.      THERAPIST PROGRESS NOTE  Session Time: 11:00AM-11:53 AM  Participation Level: Active  Behavioral Response: CasualAlertDepressed  Type of Therapy: Individual Therapy  Treatment Goals addressed: Coping  Interventions: CBT and Strength-based  Summary: Samantha Chambers is a 49 y.o. female who presents with Bipolar and Anxiety. The OPT therapist worked with the patient for herscheduledsession. The OPT therapist utilized Motivational Interviewing to assist in creating therapeutic repore. The patient in the session was engaged and work in Science writer about hertriggers and symptoms over the past few weeksincluding the ongoing stress of supervising her daughter who was recently expelled from [employer] and will be restarting at a tradition school on March 30th.The OPT therapist utilized Cognitive Behavioral Therapy through cognitive restructuring as well as worked with the patient on self care  Suicidal/Homicidal: Nowithout intent/plan  Therapist Response: The OPT therapist worked with the patient for the patients scheduled session. The patient was engaged in hersession and gave feedback in relation to triggers, symptoms, and behavior responses over the past few weeks. The OPT therapist worked with the patient utilizing an in session Cognitive Behavioral Therapy exerciseand implementing Solution  Focused Approach. The patient was responsive in the session and verbalized, " I amgoing to try going to use coping skills more and I am try [employer]". The OPT therapist will continue treatment work with the patient in hernext scheduled session.  Plan: Return again in 3 weeks.  Diagnosis: Axis I: Bipolar, Depressed and Generalized Anxiety Disorder    Axis II: No diagnosis  I discussed the assessment and treatment plan with the patient. The patient was provided an opportunity to ask questions and all were answered. The patient agreed with the plan and demonstrated an understanding of the instructions.   The patient was advised to call back or seek an in-person evaluation if the symptoms worsen or if the condition fails to improve as anticipated.  I provided 53 minutes of non-face-to-face time during this encounter.  Lennox Grumbles, LCSW 07/23/2019

## 2019-08-04 DIAGNOSIS — L814 Other melanin hyperpigmentation: Secondary | ICD-10-CM | POA: Diagnosis not present

## 2019-08-04 DIAGNOSIS — L821 Other seborrheic keratosis: Secondary | ICD-10-CM | POA: Diagnosis not present

## 2019-08-04 DIAGNOSIS — L570 Actinic keratosis: Secondary | ICD-10-CM | POA: Diagnosis not present

## 2019-08-06 ENCOUNTER — Ambulatory Visit (INDEPENDENT_AMBULATORY_CARE_PROVIDER_SITE_OTHER): Payer: Medicare HMO | Admitting: Psychiatry

## 2019-08-06 ENCOUNTER — Encounter (HOSPITAL_COMMUNITY): Payer: Self-pay | Admitting: Psychiatry

## 2019-08-06 ENCOUNTER — Other Ambulatory Visit: Payer: Self-pay

## 2019-08-06 DIAGNOSIS — F900 Attention-deficit hyperactivity disorder, predominantly inattentive type: Secondary | ICD-10-CM

## 2019-08-06 DIAGNOSIS — F411 Generalized anxiety disorder: Secondary | ICD-10-CM

## 2019-08-06 DIAGNOSIS — F3162 Bipolar disorder, current episode mixed, moderate: Secondary | ICD-10-CM | POA: Diagnosis not present

## 2019-08-06 MED ORDER — AMPHETAMINE-DEXTROAMPHETAMINE 15 MG PO TABS: ORAL_TABLET | ORAL | 0 refills | Status: DC

## 2019-08-06 MED ORDER — OLANZAPINE 2.5 MG PO TABS: 2.5000 mg | ORAL_TABLET | Freq: Every day | ORAL | 2 refills | Status: DC

## 2019-08-06 MED ORDER — AMITRIPTYLINE HCL 100 MG PO TABS: 100.0000 mg | ORAL_TABLET | Freq: Every day | ORAL | 2 refills | Status: DC

## 2019-08-06 MED ORDER — AMITRIPTYLINE HCL 25 MG PO TABS: ORAL_TABLET | ORAL | 1 refills | Status: DC

## 2019-08-06 MED ORDER — ALPRAZOLAM 1 MG PO TABS: 1.0000 mg | ORAL_TABLET | Freq: Four times a day (QID) | ORAL | 2 refills | Status: DC

## 2019-08-06 NOTE — Progress Notes (Signed)
Virtual Visit via Video Note  I connected with Samantha Chambers on 08/06/19 at 10:20 AM EDT by a video enabled telemedicine application and verified that I am speaking with the correct person using two identifiers.   I discussed the limitations of evaluation and management by telemedicine and the availability of in person appointments. The patient expressed understanding and agreed to proceed.    I discussed the assessment and treatment plan with the patient. The patient was provided an opportunity to ask questions and all were answered. The patient agreed with the plan and demonstrated an understanding of the instructions.   The patient was advised to call back or seek an in-person evaluation if the symptoms worsen or if the condition fails to improve as anticipated.  I provided  minutes of non-face-to-face time during this encounter.   Samantha Spiller, MD  [facility] MD/PA/NP OP Progress Note  08/06/2019 10:36 AM Samantha Chambers  MRN:  [id number]  Chief Complaint:  Chief Complaint    Depression; Anxiety; Manic Behavior; Follow-up     HPI: This patient is a 49 year old divorced white female who lives with her 36 year old daughter in [place].  She is on disability.  The patient returns for follow-up after 2 months for treatment of bipolar disorder anxiety and ADHD.  She states for the most part she is doing okay.  She has had a lot of stress with her teenage daughter who made a suicide attempt several months ago has been dealing with depression and ADD and got kicked out of her private school.  Her daughter recently started public school and seems to be doing better.  The patient is worried about her however in hopes that she will continue to improve particularly in her attitude.  The patient states she is sleeping well most of the time.  She has had to go back to regular Adderall because her insurance will not cover the Adderall XR and so far her focus is okay.  She denies thoughts of self-harm or  suicide but just feels worn out.  She and her daughter are going to take a trip to [place] to visit her older daughter and perhaps this will help Visit Diagnosis:    ICD-10-CM   1. Bipolar 1 disorder, mixed, moderate (HCC)  F31.62   2. Generalized anxiety disorder  F41.1   3. Attention deficit hyperactivity disorder (ADHD), predominantly inattentive type  F90.0     Past Psychiatric History: Long-term outpatient treatment  Past Medical History:  Past Medical History:  Diagnosis Date  . Angio-edema   . Anxiety   . Bipolar disorder ([place])   . Depression   . Headache(784.0)   . Irritable bowel   . Urticaria     Past Surgical History:  Procedure Laterality Date  . FOOT SURGERY      Family Psychiatric History: see below  Family History:  Family History  Problem Relation Age of Onset  . Bipolar disorder Mother   . Depression Maternal Aunt   . Depression Maternal Uncle   . Urticaria Neg Hx   . Immunodeficiency Neg Hx   . Eczema Neg Hx   . Atopy Neg Hx   . Asthma Neg Hx   . Angioedema Neg Hx   . Allergic rhinitis Neg Hx     Social History:  Social History   Socioeconomic History  . Marital status: Divorced    Spouse name: Not on file  . Number of children: Not on file  . Years of education: Not  on file  . Highest education level: Not on file  Occupational History  . Not on file  Tobacco Use  . Smoking status: Never Smoker  . Smokeless tobacco: Never Used  Substance and Sexual Activity  . Alcohol use: No  . Drug use: No  . Sexual activity: Not Currently    Partners: Male    Birth control/protection: Pill  Other Topics Concern  . Not on file  Social History Narrative  . Not on file   Social Determinants of Health   Financial Resource Strain:   . Difficulty of Paying Living Expenses:   Food Insecurity:   . Worried About Charity fundraiser in the Last Year:   . Arboriculturist in the Last Year:   Transportation Needs:   . Film/video editor  (Medical):   Marland Kitchen Lack of Transportation (Non-Medical):   Physical Activity:   . Days of Exercise per Week:   . Minutes of Exercise per Session:   Stress:   . Feeling of Stress :   Social Connections:   . Frequency of Communication with Friends and Family:   . Frequency of Social Gatherings with Friends and Family:   . Attends Religious Services:   . Active Member of Clubs or Organizations:   . Attends Archivist Meetings:   Marland Kitchen Marital Status:     Allergies:  Allergies  Allergen Reactions  . Clonazepam Nausea And Vomiting  . Sulfa Antibiotics   . Vilazodone     Per Pt med made her sick    Metabolic Disorder Labs: No results found for: HGBA1C, MPG No results found for: PROLACTIN No results found for: CHOL, TRIG, HDL, CHOLHDL, VLDL, LDLCALC No results found for: TSH  Therapeutic Level Labs: No results found for: LITHIUM Lab Results  Component Value Date   VALPROATE 33.8 (L) 03/13/2013   No components found for:  CBMZ  Current Medications: Current Outpatient Medications  Medication Sig Dispense Refill  . ALPRAZolam (XANAX) 1 MG tablet Take 1 tablet (1 mg total) by mouth 4 (four) times daily. 120 tablet 2  . amitriptyline (ELAVIL) 100 MG tablet Take 1 tablet (100 mg total) by mouth at bedtime. 90 tablet 2  . amitriptyline (ELAVIL) 25 MG tablet TAKE 1 TABLET BY MOUTH EVERYDAY AT BEDTIME 90 tablet 1  . amphetamine-dextroamphetamine (ADDERALL) 15 MG tablet Take 2 in the am and 1 at noon 90 tablet 0  . amphetamine-dextroamphetamine (ADDERALL) 15 MG tablet Take 2 in the am and 1 at noon 90 tablet 0  . EPINEPHrine 0.3 mg/0.3 mL IJ SOAJ injection INJECT 0.3 MLS (0.3 MG TOTAL) INTO THE MUSCLE ONCE FOR 1 DOSE.    . famotidine (PEPCID) 20 MG tablet     . OLANZapine (ZYPREXA) 2.5 MG tablet Take 1 tablet (2.5 mg total) by mouth at bedtime. 90 tablet 2  . ORSYTHIA 0.1-20 MG-MCG tablet      No current facility-administered medications for this visit.      Musculoskeletal: Strength & Muscle Tone: within normal limits Gait & Station: normal Patient leans: N/A  Psychiatric Specialty Exam: Review of Systems  Constitutional: Positive for fatigue.  All other systems reviewed and are negative.   There were no vitals taken for this visit.There is no height or weight on file to calculate BMI.  General Appearance: Casual and Fairly Groomed  Eye Contact:  Good  Speech:  Clear and Coherent  Volume:  Normal  Mood:  Euthymic  Affect:  Appropriate and Congruent  Thought Process:  Goal Directed  Orientation:  Full (Time, Place, and Person)  Thought Content: WDL   Suicidal Thoughts:  No  Homicidal Thoughts:  No  Memory:  Immediate;   Good Recent;   Fair Remote;   Fair  Judgement:  Good  Insight:  Good  Psychomotor Activity:  Normal  Concentration:  Concentration: Fair and Attention Span: Fair  Recall:  [employer] of Knowledge: Good  Language: Good  Akathisia:  No  Handed:  Right  AIMS (if indicated): not done  Assets:  Communication Skills Desire for Improvement Physical Health Resilience Social Support Talents/Skills  ADL's:  Intact  Cognition: WNL  Sleep:  Good   Screenings: MDI     Office Visit from 11/22/2015 in [facility]  Total Score (max 50)  38       Assessment and Plan: This patient is a 49 year old female with a history of bipolar disorder and ADHD.  She feels like her mood is fairly well controlled right now and she is focusing well.  She will continue amitriptyline 125 mg at bedtime for sleep and depression, olanzapine 2.5 mg at bedtime for mood stabilization, Xanax 1 mg up to 4 times daily for anxiety and Adderall  30 mg in the morning and 15 mg at noon for ADD.  She will return to see me in 2 months   Samantha Spiller, MD 08/06/2019, 10:36 AM

## 2019-08-24 DIAGNOSIS — N390 Urinary tract infection, site not specified: Secondary | ICD-10-CM | POA: Diagnosis not present

## 2019-08-24 DIAGNOSIS — Z299 Encounter for prophylactic measures, unspecified: Secondary | ICD-10-CM | POA: Diagnosis not present

## 2019-09-01 DIAGNOSIS — Z299 Encounter for prophylactic measures, unspecified: Secondary | ICD-10-CM | POA: Diagnosis not present

## 2019-09-01 DIAGNOSIS — K219 Gastro-esophageal reflux disease without esophagitis: Secondary | ICD-10-CM | POA: Diagnosis not present

## 2019-09-01 DIAGNOSIS — Z789 Other specified health status: Secondary | ICD-10-CM | POA: Diagnosis not present

## 2019-09-01 DIAGNOSIS — E7800 Pure hypercholesterolemia, unspecified: Secondary | ICD-10-CM | POA: Diagnosis not present

## 2019-09-04 ENCOUNTER — Other Ambulatory Visit (HOSPITAL_COMMUNITY): Payer: Self-pay | Admitting: Psychiatry

## 2019-09-04 ENCOUNTER — Telehealth (HOSPITAL_COMMUNITY): Payer: Self-pay | Admitting: *Deleted

## 2019-09-04 MED ORDER — AMITRIPTYLINE HCL 25 MG PO TABS: ORAL_TABLET | ORAL | 1 refills | Status: DC

## 2019-09-04 MED ORDER — AMITRIPTYLINE HCL 100 MG PO TABS: 100.0000 mg | ORAL_TABLET | Freq: Every day | ORAL | 2 refills | Status: DC

## 2019-09-04 NOTE — Telephone Encounter (Signed)
Patient called Rx for refill & was told Script  For amitriptyline (ELAVIL) 25 MG tablet. Came back as unable to read...  New Script Required

## 2019-09-04 NOTE — Telephone Encounter (Signed)
resent

## 2019-09-07 DIAGNOSIS — R300 Dysuria: Secondary | ICD-10-CM | POA: Diagnosis not present

## 2019-09-07 DIAGNOSIS — Z299 Encounter for prophylactic measures, unspecified: Secondary | ICD-10-CM | POA: Diagnosis not present

## 2019-09-07 DIAGNOSIS — F319 Bipolar disorder, unspecified: Secondary | ICD-10-CM | POA: Diagnosis not present

## 2019-09-07 DIAGNOSIS — N390 Urinary tract infection, site not specified: Secondary | ICD-10-CM | POA: Diagnosis not present

## 2019-09-09 ENCOUNTER — Ambulatory Visit (INDEPENDENT_AMBULATORY_CARE_PROVIDER_SITE_OTHER): Payer: Medicare HMO | Admitting: Clinical

## 2019-09-09 ENCOUNTER — Other Ambulatory Visit: Payer: Self-pay

## 2019-09-09 DIAGNOSIS — F3162 Bipolar disorder, current episode mixed, moderate: Secondary | ICD-10-CM

## 2019-09-09 DIAGNOSIS — F411 Generalized anxiety disorder: Secondary | ICD-10-CM

## 2019-09-09 NOTE — Progress Notes (Deleted)
   THERAPIST PROGRESS NOTE  Session Time: ***  Participation Level: {BHH PARTICIPATION LEVEL:22264}  Behavioral Response: {Appearance:22683}{BHH LEVEL OF CONSCIOUSNESS:22305}{BHH MOOD:22306}  Type of Therapy: {CHL AMB BH Type of Therapy:21022741}  Treatment Goals addressed: {CHL AMB BH Treatment Goals Addressed:21022754}  Interventions: {CHL AMB BH Type of Intervention:21022753}  Summary: Samantha Chambers is a 49 y.o. female who presents with ***.   Suicidal/Homicidal: {BHH YES OR NO:22294}{yes/no/with/without intent/plan:22693}  Therapist Response: ***  Plan: Return again in *** weeks.  Diagnosis: Axis I: {psych axis 1:31909}    Axis II: {psych axis 2:31910}    Lennox Grumbles, LCSW 09/09/2019

## 2019-09-09 NOTE — Progress Notes (Deleted)
  Virtual Visit via Video Note  I connected with Samantha Chambers on 09/09/19 at  2:00 PM EDT by a video enabled telemedicine application and verified that I am speaking with the correct person using two identifiers.  Location: Patient: Home Provider: Office   I discussed the limitations of evaluation and management by telemedicine and the availability of in person appointments. The patient expressed understanding and agreed to proceed.           THERAPIST PROGRESS NOTE  Session Time: 2:00PM-2:45PM  Participation Level: Active  Behavioral Response: CasualAlertDepressed  Type of Therapy: Individual Therapy  Treatment Goals addressed: Coping  Interventions: CBT, Strength-based and Supportive  Summary: Samantha Chambers is a 49 y.o. female who presents with Bipolar and Anxiety.The OPT therapist worked with thepatientfor herscheduledsession. The OPT therapist utilized Motivational Interviewing to assist in creating therapeutic repore. The patient in the session was engaged and work in Science writer about hertriggers and symptoms over the past few weeksincluding interactions with her daughter and her daughters Father.The OPT therapist utilized Cognitive Behavioral Therapy through cognitive restructuring as well as worked with the patienton self care including balancing her responsibilities with leisure, work, and socialization. The OPT therapist promoted decreasing self isolation.    Suicidal/Homicidal: Nowithout intent/plan  Therapist Response: The OPT therapist worked with the patient for the patients scheduled session. The patient was engaged in hersession and gave feedback in relation to triggers, symptoms, and behavior responses over the pastfewweeks. The OPT therapist worked with the patient utilizing an in session Cognitive Behavioral Therapy exerciseand implementing Solution Focused Approach. The patient was responsive in the session and verbalized,  " I amgoing totry going to work on socialization, house projects, and my health".The OPT therapist will continue treatment work with the patient in hernext scheduled session.   Plan: Return again in 2/3 weeks.  Diagnosis: Axis I: Bipolar, Depressed and Generalized Anxiety Disorder    Axis II: No diagnosis  I discussed the assessment and treatment plan with the patient. The patient was provided an opportunity to ask questions and all were answered. The patient agreed with the plan and demonstrated an understanding of the instructions.   The patient was advised to call back or seek an in-person evaluation if the symptoms worsen or if the condition fails to improve as anticipated.  I provided 45 minutes of non-face-to-face time during this encounter.  Lennox Grumbles, LCSW 09/09/2019

## 2019-09-09 NOTE — Progress Notes (Signed)
Virtual Visit via Video Note  I connected with Samantha Chambers on 09/09/19 at  2:00 PM EDT by a video enabled telemedicine application and verified that I am speaking with the correct person using two identifiers.  Location: Patient: Home Provider: Office   I discussed the limitations of evaluation and management by telemedicine and the availability of in person appointments. The patient expressed understanding and agreed to proceed.    THERAPIST PROGRESS NOTE  Session Time: 2:00PM-2:45PM  Participation Level: Active  Behavioral Response: CasualAlertDepressed  Type of Therapy: Individual Therapy  Treatment Goals addressed: Coping  Interventions: CBT, Strength-based and Supportive  Summary: Samantha Chambers is a 49 y.o. female who presents with Bipolar and Anxiety.The OPT therapist worked with thepatientfor herscheduledsession. The OPT therapist utilized Motivational Interviewing to assist in creating therapeutic repore. The patient in the session was engaged and work in Science writer about hertriggers and symptoms over the past few weeksincluding interactions with her daughter and her daughters Father.The OPT therapist utilized Cognitive Behavioral Therapy through cognitive restructuring as well as worked with the patienton self care including balancing her responsibilities with leisure, work, and socialization. The OPT therapist promoted decreasing self isolation.    Suicidal/Homicidal: Nowithout intent/plan  Therapist Response: The OPT therapist worked with the patient for the patients scheduled session. The patient was engaged in hersession and gave feedback in relation to triggers, symptoms, and behavior responses over the pastfewweeks. The OPT therapist worked with the patient utilizing an in session Cognitive Behavioral Therapy exerciseand implementing Solution Focused Approach. The patient was responsive in the session and verbalized, " I amgoing  totry going to work on socialization, house projects, and my health".The OPT therapist will continue treatment work with the patient in hernext scheduled session.   Plan: Return again in 2/3 weeks.  Diagnosis: Axis I: Bipolar, Depressed and Generalized Anxiety Disorder    Axis II: No diagnosis  I discussed the assessment and treatment plan with the patient. The patient was provided an opportunity to ask questions and all were answered. The patient agreed with the plan and demonstrated an understanding of the instructions.   The patient was advised to call back or seek an in-person evaluation if the symptoms worsen or if the condition fails to improve as anticipated.  I provided 45 minutes of non-face-to-face time during this encounter.  Lennox Grumbles, LCSW 09/09/2019

## 2019-09-09 NOTE — Progress Notes (Deleted)
Virtual Visit via Video Note  I connected with Samantha Chambers on 09/09/19 at  2:00 PM EDT by a video enabled telemedicine application and verified that I am speaking with the correct person using two identifiers.  Location: Patient: Home Provider: Office   I discussed the limitations of evaluation and management by telemedicine and the availability of in person appointments. The patient expressed understanding and agreed to proceed.           THERAPIST PROGRESS NOTE  Session Time: 2:00PM-2:45PM  Participation Level: Active  Behavioral Response: CasualAlertDepressed  Type of Therapy: Individual Therapy  Treatment Goals addressed: Coping  Interventions: CBT, Strength-based and Supportive  Summary: Samantha Chambers is a 49 y.o. female who presents with Bipolar and Anxiety.The OPT therapist worked with thepatientfor herscheduledsession. The OPT therapist utilized Motivational Interviewing to assist in creating therapeutic repore. The patient in the session was engaged and work in Science writer about hertriggers and symptoms over the past few weeksincluding interactions with her daughter and her daughters Father.The OPT therapist utilized Cognitive Behavioral Therapy through cognitive restructuring as well as worked with the patienton self care including balancing her responsibilities with leisure, work, and socialization. The OPT therapist promoted decreasing self isolation.    Suicidal/Homicidal: Nowithout intent/plan  Therapist Response: The OPT therapist worked with the patient for the patients scheduled session. The patient was engaged in hersession and gave feedback in relation to triggers, symptoms, and behavior responses over the pastfewweeks. The OPT therapist worked with the patient utilizing an in session Cognitive Behavioral Therapy exerciseand implementing Solution Focused Approach. The patient was responsive in the session and verbalized, "  I amgoing totry going to work on socialization, house projects, and my health".The OPT therapist will continue treatment work with the patient in hernext scheduled session.   Plan: Return again in 2/3 weeks.  Diagnosis: Axis I: Bipolar, Depressed and Generalized Anxiety Disorder    Axis II: No diagnosis  I discussed the assessment and treatment plan with the patient. The patient was provided an opportunity to ask questions and all were answered. The patient agreed with the plan and demonstrated an understanding of the instructions.   The patient was advised to call back or seek an in-person evaluation if the symptoms worsen or if the condition fails to improve as anticipated.  I provided 45 minutes of non-face-to-face time during this encounter.  Samantha Grumbles, LCSW 09/09/2019

## 2019-09-21 ENCOUNTER — Other Ambulatory Visit (HOSPITAL_COMMUNITY): Payer: Self-pay | Admitting: Psychiatry

## 2019-09-21 ENCOUNTER — Telehealth (HOSPITAL_COMMUNITY): Payer: Self-pay

## 2019-09-21 MED ORDER — AMPHETAMINE-DEXTROAMPHETAMINE 15 MG PO TABS: ORAL_TABLET | ORAL | 0 refills | Status: DC

## 2019-09-21 NOTE — Telephone Encounter (Signed)
sent 

## 2019-09-21 NOTE — Telephone Encounter (Signed)
Patient called and requested that her Amphetamine-dextroamphetamine 15mg  prescription be sent to [facility] on [address] in [place] instead of [facility]. She stated that she's been having a lot of problems/issues with [facility].. Followup scheduled for 10/07/2019. Thank you.

## 2019-09-25 ENCOUNTER — Ambulatory Visit (INDEPENDENT_AMBULATORY_CARE_PROVIDER_SITE_OTHER): Payer: Medicare HMO | Admitting: Clinical

## 2019-09-25 ENCOUNTER — Other Ambulatory Visit: Payer: Self-pay

## 2019-09-25 DIAGNOSIS — F411 Generalized anxiety disorder: Secondary | ICD-10-CM

## 2019-09-25 DIAGNOSIS — F3162 Bipolar disorder, current episode mixed, moderate: Secondary | ICD-10-CM | POA: Diagnosis not present

## 2019-09-25 NOTE — Progress Notes (Signed)
   Virtual Visit via Video Note  I connected withMelissa K Chambers on 09/25/19 at  11:00 AM EDT by a video enabled telemedicine application and verified that I am speaking with the correct person using two identifiers.  Location: Patient: Home Provider: Office  I discussed the limitations of evaluation and management by telemedicine and the availability of in person appointments. The patient expressed understanding and agreed to proceed.    THERAPIST PROGRESS NOTE  Session Time: 11:00AM-11:55AM  Participation Level: Active  Behavioral Response: CasualAlertDepressed  Type of Therapy: Individual Therapy  Treatment Goals addressed: Coping  Interventions: CBT, Strength-based and Supportive  Summary: Samantha Chambers is a 49 y.o. female who presents with Bipolar and Anxiety.The OPT therapist worked with thepatientfor herscheduledsession. The OPT therapist utilized Motivational Interviewing to assist in creating therapeutic repore. The patient in the session was engaged and work in Science writer about hertriggers and symptoms over the past few weeksincluding interactions with her daughter and her daughters Father going into rehab after falling back into his alcohol additction.The OPT therapist utilized Cognitive Behavioral Therapy through cognitive restructuring as well as worked with the [place] care including balancing her responsibilities with leisure, work, and socialization. The OPT therapist promoted decreasing self isolation and increase in self care.    Suicidal/Homicidal: Nowithout intent/plan  Therapist Response: The OPT therapist worked with the patient for the patients scheduled session. The patient was engaged in hersession and gave feedback in relation to triggers, symptoms, and behavior responses over the pastfewweeks. The OPT therapist worked with the patient utilizing an in session Cognitive Behavioral Therapy exerciseand  implementing Solution Focused Approach. The patient was responsive in the session and verbalized, " I amgoing tocontinue to work on socialization getting out and meeting people, trying new things".The OPT therapist will continue treatment work with the patient in hernext scheduled session.   Plan: Return again in 2/3 weeks.  Diagnosis:      Axis I: Bipolar, Depressed and Generalized Anxiety Disorder                          Axis II: No diagnosis  I discussed the assessment and treatment plan with the patient. The patient was provided an opportunity to ask questions and all were answered. The patient agreed with the plan and demonstrated an understanding of the instructions.  The patient was advised to call back or seek an in-person evaluation if the symptoms worsen or if the condition fails to improve as anticipated.  I provided 55 minutes of non-face-to-face time during this encounter.  Lennox Grumbles, LCSW 09/25/2019

## 2019-09-25 NOTE — Telephone Encounter (Signed)
Informed patient

## 2019-10-05 DIAGNOSIS — I10 Essential (primary) hypertension: Secondary | ICD-10-CM | POA: Diagnosis not present

## 2019-10-05 DIAGNOSIS — E785 Hyperlipidemia, unspecified: Secondary | ICD-10-CM | POA: Diagnosis not present

## 2019-10-06 DIAGNOSIS — E538 Deficiency of other specified B group vitamins: Secondary | ICD-10-CM | POA: Diagnosis not present

## 2019-10-06 DIAGNOSIS — Z1339 Encounter for screening examination for other mental health and behavioral disorders: Secondary | ICD-10-CM | POA: Diagnosis not present

## 2019-10-06 DIAGNOSIS — E559 Vitamin D deficiency, unspecified: Secondary | ICD-10-CM | POA: Diagnosis not present

## 2019-10-06 DIAGNOSIS — E7800 Pure hypercholesterolemia, unspecified: Secondary | ICD-10-CM | POA: Diagnosis not present

## 2019-10-06 DIAGNOSIS — Z789 Other specified health status: Secondary | ICD-10-CM | POA: Diagnosis not present

## 2019-10-06 DIAGNOSIS — Z1211 Encounter for screening for malignant neoplasm of colon: Secondary | ICD-10-CM | POA: Diagnosis not present

## 2019-10-06 DIAGNOSIS — R5383 Other fatigue: Secondary | ICD-10-CM | POA: Diagnosis not present

## 2019-10-06 DIAGNOSIS — Z7189 Other specified counseling: Secondary | ICD-10-CM | POA: Diagnosis not present

## 2019-10-06 DIAGNOSIS — Z1331 Encounter for screening for depression: Secondary | ICD-10-CM | POA: Diagnosis not present

## 2019-10-06 DIAGNOSIS — Z0000 Encounter for general adult medical examination without abnormal findings: Secondary | ICD-10-CM | POA: Diagnosis not present

## 2019-10-06 DIAGNOSIS — Z299 Encounter for prophylactic measures, unspecified: Secondary | ICD-10-CM | POA: Diagnosis not present

## 2019-10-06 DIAGNOSIS — F319 Bipolar disorder, unspecified: Secondary | ICD-10-CM | POA: Diagnosis not present

## 2019-10-06 DIAGNOSIS — Z79899 Other long term (current) drug therapy: Secondary | ICD-10-CM | POA: Diagnosis not present

## 2019-10-06 DIAGNOSIS — Z6822 Body mass index (BMI) 22.0-22.9, adult: Secondary | ICD-10-CM | POA: Diagnosis not present

## 2019-10-07 ENCOUNTER — Encounter (HOSPITAL_COMMUNITY): Payer: Self-pay | Admitting: Psychiatry

## 2019-10-07 ENCOUNTER — Other Ambulatory Visit: Payer: Self-pay

## 2019-10-07 ENCOUNTER — Telehealth (INDEPENDENT_AMBULATORY_CARE_PROVIDER_SITE_OTHER): Payer: Medicare HMO | Admitting: Psychiatry

## 2019-10-07 DIAGNOSIS — F3162 Bipolar disorder, current episode mixed, moderate: Secondary | ICD-10-CM | POA: Diagnosis not present

## 2019-10-07 DIAGNOSIS — F411 Generalized anxiety disorder: Secondary | ICD-10-CM | POA: Diagnosis not present

## 2019-10-07 DIAGNOSIS — F900 Attention-deficit hyperactivity disorder, predominantly inattentive type: Secondary | ICD-10-CM | POA: Diagnosis not present

## 2019-10-07 MED ORDER — ALPRAZOLAM 1 MG PO TABS: 1.0000 mg | ORAL_TABLET | Freq: Four times a day (QID) | ORAL | 2 refills | Status: DC

## 2019-10-07 MED ORDER — AMPHETAMINE-DEXTROAMPHETAMINE 15 MG PO TABS: ORAL_TABLET | ORAL | 0 refills | Status: DC

## 2019-10-07 MED ORDER — AMITRIPTYLINE HCL 100 MG PO TABS: 100.0000 mg | ORAL_TABLET | Freq: Every day | ORAL | 2 refills | Status: DC

## 2019-10-07 MED ORDER — OLANZAPINE 2.5 MG PO TABS: 2.5000 mg | ORAL_TABLET | Freq: Every day | ORAL | 2 refills | Status: DC

## 2019-10-07 MED ORDER — AMITRIPTYLINE HCL 25 MG PO TABS: ORAL_TABLET | ORAL | 1 refills | Status: DC

## 2019-10-07 NOTE — Progress Notes (Signed)
Virtual Visit via Video Note  I connected with [name] on 10/07/19 at 10:40 AM EDT by a video enabled telemedicine application and verified that I am speaking with the correct person using two identifiers.   I discussed the limitations of evaluation and management by telemedicine and the availability of in person appointments. The patient expressed understanding and agreed to proceed.    I discussed the assessment and treatment plan with the patient. The patient was provided an opportunity to ask questions and all were answered. The patient agreed with the plan and demonstrated an understanding of the instructions.   The patient was advised to call back or seek an in-person evaluation if the symptoms worsen or if the condition fails to improve as anticipated.  I provided 15 minutes of non-face-to-face time during this encounter. Patient: Provider at home, patient home  [name], MD  [facility] MD/PA/NP OP Progress Note  10/07/2019 11:11 AM Samantha Chambers  MRN:  [id number]  Chief Complaint:  HPI: This patient is a 49 year old divorced white female who lives with her 21 year old daughter in [place]. She is on disability.  The patient returns for follow-up after 2 months for treatment of bipolar disorder anxiety and ADHD. She states that she is doing better. Her daughter is settled into her new school quite well and has made friends. This has relieved some of her anxiety about her daughter. She is trying to branch out and meet people to date online but it is not going very well and she states some of the mail online profiles are fake which is disheartening. She is spending a lot of time with her daughter and even her ex-husband who has become a friend now. Her mood is generally good she is sleeping well and she is going to start getting into an exercise program. She denies suicidal ideation or thoughts of self-harm. She is focusing well with the Adderall Visit Diagnosis:    ICD-10-CM   1.  Bipolar 1 disorder, mixed, moderate (HCC)  F31.62   2. Generalized anxiety disorder  F41.1   3. Attention deficit hyperactivity disorder (ADHD), predominantly inattentive type  F90.0     Past Psychiatric History: Long-term outpatient treatment  Past Medical History:  Past Medical History:  Diagnosis Date  . Angio-edema   . Anxiety   . Bipolar disorder ([place])   . Depression   . Headache(784.0)   . Irritable bowel   . Urticaria     Past Surgical History:  Procedure Laterality Date  . FOOT SURGERY      Family Psychiatric History: see below  Family History:  Family History  Problem Relation Age of Onset  . Bipolar disorder Mother   . Depression Maternal Aunt   . Depression Maternal Uncle   . Urticaria Neg Hx   . Immunodeficiency Neg Hx   . Eczema Neg Hx   . Atopy Neg Hx   . Asthma Neg Hx   . Angioedema Neg Hx   . Allergic rhinitis Neg Hx     Social History:  Social History   Socioeconomic History  . Marital status: Divorced    Spouse name: Not on file  . Number of children: Not on file  . Years of education: Not on file  . Highest education level: Not on file  Occupational History  . Not on file  Tobacco Use  . Smoking status: Never Smoker  . Smokeless tobacco: Never Used  Substance and Sexual Activity  . Alcohol use: No  .  Drug use: No  . Sexual activity: Not Currently    Partners: Male    Birth control/protection: Pill  Other Topics Concern  . Not on file  Social History Narrative  . Not on file   Social Determinants of Health   Financial Resource Strain:   . Difficulty of Paying Living Expenses:   Food Insecurity:   . Worried About Charity fundraiser in the Last Year:   . Arboriculturist in the Last Year:   Transportation Needs:   . Film/video editor (Medical):   Marland Kitchen Lack of Transportation (Non-Medical):   Physical Activity:   . Days of Exercise per Week:   . Minutes of Exercise per Session:   Stress:   . Feeling of Stress :   Social  Connections:   . Frequency of Communication with Friends and Family:   . Frequency of Social Gatherings with Friends and Family:   . Attends Religious Services:   . Active Member of Clubs or Organizations:   . Attends Archivist Meetings:   Marland Kitchen Marital Status:     Allergies:  Allergies  Allergen Reactions  . Clonazepam Nausea And Vomiting  . Sulfa Antibiotics   . Vilazodone     Per Pt med made her sick    Metabolic Disorder Labs: No results found for: HGBA1C, MPG No results found for: PROLACTIN No results found for: CHOL, TRIG, HDL, CHOLHDL, VLDL, LDLCALC No results found for: TSH  Therapeutic Level Labs: No results found for: LITHIUM Lab Results  Component Value Date   VALPROATE 33.8 (L) 03/13/2013   No components found for:  CBMZ  Current Medications: Current Outpatient Medications  Medication Sig Dispense Refill  . ALPRAZolam (XANAX) 1 MG tablet Take 1 tablet (1 mg total) by mouth 4 (four) times daily. 120 tablet 2  . amitriptyline (ELAVIL) 100 MG tablet Take 1 tablet (100 mg total) by mouth at bedtime. 90 tablet 2  . amitriptyline (ELAVIL) 25 MG tablet TAKE 1 TABLET BY MOUTH EVERYDAY AT BEDTIME 90 tablet 1  . amphetamine-dextroamphetamine (ADDERALL) 15 MG tablet Take 2 in the am and 1 at noon 90 tablet 0  . amphetamine-dextroamphetamine (ADDERALL) 15 MG tablet Take 2 in the am and 1 at noon 90 tablet 0  . EPINEPHrine 0.3 mg/0.3 mL IJ SOAJ injection INJECT 0.3 MLS (0.3 MG TOTAL) INTO THE MUSCLE ONCE FOR 1 DOSE.    . famotidine (PEPCID) 20 MG tablet     . OLANZapine (ZYPREXA) 2.5 MG tablet Take 1 tablet (2.5 mg total) by mouth at bedtime. 90 tablet 2  . ORSYTHIA 0.1-20 MG-MCG tablet      No current facility-administered medications for this visit.     Musculoskeletal: Strength & Muscle Tone: within normal limits Gait & Station: normal Patient leans: N/A  Psychiatric Specialty Exam: Review of Systems  All other systems reviewed and are negative.    There were no vitals taken for this visit.There is no height or weight on file to calculate BMI.  General Appearance: Casual, Neat and Well Groomed  Eye Contact:  Good  Speech:  Clear and Coherent  Volume:  Normal  Mood:  Euthymic  Affect:  Appropriate and Congruent  Thought Process:  Goal Directed  Orientation:  Full (Time, Place, and Person)  Thought Content: WDL   Suicidal Thoughts:  No  Homicidal Thoughts:  No  Memory:  Immediate;   Good Recent;   Good Remote;   Good  Judgement:  Good  Insight:  Fair  Psychomotor Activity:  Normal  Concentration:  Concentration: Good and Attention Span: Good  Recall:  Good  Fund of Knowledge: Good  Language: Good  Akathisia:  No  Handed:  Right  AIMS (if indicated): not done  Assets:  Communication Skills Desire for Improvement Physical Health Resilience Social Support Talents/Skills  ADL's:  Intact  Cognition: WNL  Sleep:  Good   Screenings: MDI     Office Visit from 11/22/2015 in [facility]  Total Score (max 50)  38       Assessment and Plan: This patient is a 49 year old female with a history of bipolar disorder and ADHD she seems to be doing well on her current regimen. She will continue amitriptyline 125 mg at bedtime for sleep and depression, olanzapine 2.5 mg at bedtime for mood stabilization, Xanax 1 mg up to 4 times daily for anxiety and Adderall 30 mg in the morning and 15 mg at noon for ADD. She will return to see me in 2 months   [name], MD 10/07/2019, 11:11 AM

## 2019-10-14 ENCOUNTER — Other Ambulatory Visit: Payer: Self-pay

## 2019-10-14 ENCOUNTER — Telehealth (INDEPENDENT_AMBULATORY_CARE_PROVIDER_SITE_OTHER): Payer: Medicare HMO | Admitting: Clinical

## 2019-10-14 DIAGNOSIS — F3132 Bipolar disorder, current episode depressed, moderate: Secondary | ICD-10-CM | POA: Diagnosis not present

## 2019-10-14 DIAGNOSIS — F411 Generalized anxiety disorder: Secondary | ICD-10-CM | POA: Diagnosis not present

## 2019-10-14 NOTE — Progress Notes (Signed)
  Virtual Visit via Video Note  I connected withMelissa K Chambers on 09/25/19 at 1:00 PM EDT by a video enabled telemedicine application and verified that I am speaking with the correct person using two identifiers.  Location: Patient: Home Provider: Office  I discussed the limitations of evaluation and management by telemedicine and the availability of in person appointments. The patient expressed understanding and agreed to proceed.    THERAPIST PROGRESS NOTE  Session Time: 1:00 PM-1:45PM  Participation Level:Active  Behavioral Response:CasualAlertDepressed  Type of Therapy: Individual Therapy  Treatment Goals addressed: Coping  Interventions:CBT, Strength-based and Supportive  Summary: Samantha Chambers is a 49 y.o. female who presents with Bipolar and Anxiety.The OPT therapist worked with thepatientfor herscheduledsession. The OPT therapist utilized Motivational Interviewing to assist in creating therapeutic repore. The patient in the session was engaged and work in Science writer about hertriggers and symptoms over the past few weeksincluding difficulty in trusting others, a falling out with a long time friend, and ongoing work on socialization.The OPT therapist utilized Cognitive Behavioral Therapy through cognitive restructuring as well as worked with the patienton positive self talk and challenging negative thinking.   Suicidal/Homicidal:Nowithout intent/plan  Therapist Response: The OPT therapist worked with the patient for the patients scheduled session. The patient was engaged in hersession and gave feedback in relation to triggers, symptoms, and behavior responses over the pastfewweeks. The OPT therapist worked with the patient utilizing an in session Cognitive Behavioral Therapy exerciseand implementing Solution Focused Approach. The patient was responsive in the session and verbalized, " I amstarting to spend more time with  friends and I am dating more". The OPT therapist worked with the patient in challenge negative thinking and worked with the patient on not catastrophizing.The OPT therapist will continue treatment work with the patient in hernext scheduled session.  Plan: Return again in 2/3 weeks.  Diagnosis:Axis I:Bipolar, Depressed and Generalized Anxiety Disorder  Axis II:No diagnosis  I discussed the assessment and treatment plan with the patient. The patient was provided an opportunity to ask questions and all were answered. The patient agreed with the plan and demonstrated an understanding of the instructions.  The patient was advised to call back or seek an in-person evaluation if the symptoms worsen or if the condition fails to improve as anticipated.  I provided 45 minutes of non-face-to-face time during this encounter.  Lennox Grumbles, LCSW 10/14/2019

## 2019-10-20 NOTE — Telephone Encounter (Signed)
completed

## 2019-10-23 DIAGNOSIS — Z789 Other specified health status: Secondary | ICD-10-CM | POA: Diagnosis not present

## 2019-10-23 DIAGNOSIS — Z299 Encounter for prophylactic measures, unspecified: Secondary | ICD-10-CM | POA: Diagnosis not present

## 2019-10-23 DIAGNOSIS — F319 Bipolar disorder, unspecified: Secondary | ICD-10-CM | POA: Diagnosis not present

## 2019-10-23 DIAGNOSIS — K219 Gastro-esophageal reflux disease without esophagitis: Secondary | ICD-10-CM | POA: Diagnosis not present

## 2019-10-28 ENCOUNTER — Encounter: Payer: Self-pay | Admitting: Internal Medicine

## 2019-11-10 ENCOUNTER — Other Ambulatory Visit: Payer: Self-pay

## 2019-11-10 ENCOUNTER — Telehealth (INDEPENDENT_AMBULATORY_CARE_PROVIDER_SITE_OTHER): Payer: Medicare HMO | Admitting: Clinical

## 2019-11-10 DIAGNOSIS — F3132 Bipolar disorder, current episode depressed, moderate: Secondary | ICD-10-CM

## 2019-11-10 DIAGNOSIS — F411 Generalized anxiety disorder: Secondary | ICD-10-CM

## 2019-11-10 NOTE — Progress Notes (Signed)
  Virtual Visit via Video Note  I connected withMelissa K Chambers on 11/10/19 at1:00PM EDT by a video enabled telemedicine application and verified that I am speaking with the correct person using two identifiers.  Location: Patient: Home Provider: Office  I discussed the limitations of evaluation and management by telemedicine and the availability of in person appointments. The patient expressed understanding and agreed to proceed.    THERAPIST PROGRESS NOTE  Session Time:1:00 PM-1:53PM  Participation Level:Active  Behavioral Response:CasualAlertDepressed  Type of Therapy: Individual Therapy  Treatment Goals addressed: Coping  Interventions:CBT, Strength-based and Supportive  Summary: Samantha Chambers is a 49 y.o. female who presents with Bipolar and Anxiety.The OPT therapist worked with thepatientfor herscheduledsession. The OPT therapist utilized Motivational Interviewing to assist in creating therapeutic repore. The patient in the session was engaged and work in Science writer about hertriggers and symptoms over the past few weeksincluding interactions with her ex-husband, and a negative dating experience.The OPT therapist utilized Cognitive Behavioral Therapy through cognitive restructuring as well as worked with the patienton positive self talk and challenging negative thinking. The OPT therapist worked to empower the patient and promoted decision making based on the patient not settling or making an emotional/immediate decision, but ensuring that she looking at pros and cons.  Suicidal/Homicidal:Nowithout intent/plan  Therapist Response: The OPT therapist worked with the patient for the patients scheduled session. The patient was engaged in hersession and gave feedback in relation to triggers, symptoms, and behavior responses over the pastfewweeks. The OPT therapist worked with the patient utilizing an in session Cognitive  Behavioral Therapy exercise. The patient was responsive in the session and verbalized, " I am finding out more from going through this process and I am more sure of the things that I will not settle for or do not want". The OPT therapist worked with the patient in challenge negative thinking and worked with the patient on realizing that she has the control to make decisions that are right for her not because it is what others may want.The OPT therapist will continue treatment work with the patient in hernext scheduled session.  Plan: Return again in 2/3 weeks.  Diagnosis:Axis I:Bipolar, Depressed and Generalized Anxiety Disorder  Axis II:No diagnosis  I discussed the assessment and treatment plan with the patient. The patient was provided an opportunity to ask questions and all were answered. The patient agreed with the plan and demonstrated an understanding of the instructions.  The patient was advised to call back or seek an in-person evaluation if the symptoms worsen or if the condition fails to improve as anticipated.  I provided68minutes of non-face-to-face time during this encounter.  Lennox Grumbles, LCSW 11/10/2019

## 2019-11-25 ENCOUNTER — Telehealth (INDEPENDENT_AMBULATORY_CARE_PROVIDER_SITE_OTHER): Payer: Medicare HMO | Admitting: Clinical

## 2019-11-25 ENCOUNTER — Other Ambulatory Visit: Payer: Self-pay

## 2019-11-25 DIAGNOSIS — F411 Generalized anxiety disorder: Secondary | ICD-10-CM

## 2019-11-25 DIAGNOSIS — F3132 Bipolar disorder, current episode depressed, moderate: Secondary | ICD-10-CM

## 2019-11-25 NOTE — Progress Notes (Signed)
  Virtual Visit via Video Note  I connected with [name] on 11/25/19 at  1:00 PM EDT by a video enabled telemedicine application and verified that I am speaking with the correct person using two identifiers.  Location: Patient: Home Provider: Office  I discussed the limitations of evaluation and management by telemedicine and the availability of in person appointments. The patient expressed understanding and agreed to proceed.    THERAPIST PROGRESS NOTE  Session Time:1:00PM-1:53PM  Participation Level:Active  Behavioral Response:CasualAlertDepressed  Type of Therapy: Individual Therapy  Treatment Goals addressed: Coping  Interventions:CBT, Strength-based and Supportive  Summary: Samantha Chambers is a 49 y.o. female who presents with Bipolar and Anxiety.The OPT therapist worked with thepatientfor her finalscheduledsession. The OPT therapist utilized Motivational Interviewing to assist in creating therapeutic repore. The patient in the session was engaged and work in Science writer about hertriggers and symptoms over the past few weeksincludinginteractions with her ex-husband, and daughter.The OPT therapist utilized Cognitive Behavioral Therapy through cognitive restructuring as well as worked with the patientonpositive self talk and challenging negative thinking. The OPT therapist worked to continue toempower the patient. The patient was in agreement at this time to [place] based on her progress from the therapy program.  Suicidal/Homicidal:Nowithout intent/plan  Therapist Response: The OPT therapist worked with the patient for the patients scheduled session. The patient was engaged in hersession and gave feedback in relation to triggers, symptoms, and behavior responses over the pastfewweeks. The OPT therapist worked with the patient utilizing an in session Cognitive Behavioral Therapy exercise. The patient  was responsive in the session and verbalized, " I am doing really good and much better from when we started the program".The OPT therapist worked with the patient in challenge negative thinking.The OPT therapist will discharge patient as a success from meeting with consistency her plan goals Plan: Discharge.  Diagnosis:Axis I:Bipolar, Depressed and Generalized Anxiety Disorder  Axis II:No diagnosis  I discussed the assessment and treatment plan with the patient. The patient was provided an opportunity to ask questions and all were answered. The patient agreed with the plan and demonstrated an understanding of the instructions.  The patient was advised to call back or seek an in-person evaluation if the symptoms worsen or if the condition fails to improve as anticipated.  I provided76minutes of non-face-to-face time during this encounter.  Lennox Grumbles, LCSW

## 2019-12-04 DIAGNOSIS — E559 Vitamin D deficiency, unspecified: Secondary | ICD-10-CM | POA: Diagnosis not present

## 2019-12-04 DIAGNOSIS — E785 Hyperlipidemia, unspecified: Secondary | ICD-10-CM | POA: Diagnosis not present

## 2019-12-04 DIAGNOSIS — K219 Gastro-esophageal reflux disease without esophagitis: Secondary | ICD-10-CM | POA: Diagnosis not present

## 2019-12-09 ENCOUNTER — Encounter (HOSPITAL_COMMUNITY): Payer: Self-pay | Admitting: Psychiatry

## 2019-12-09 ENCOUNTER — Other Ambulatory Visit: Payer: Self-pay

## 2019-12-09 ENCOUNTER — Telehealth (INDEPENDENT_AMBULATORY_CARE_PROVIDER_SITE_OTHER): Payer: Medicare HMO | Admitting: Psychiatry

## 2019-12-09 ENCOUNTER — Other Ambulatory Visit (HOSPITAL_COMMUNITY): Payer: Self-pay | Admitting: Psychiatry

## 2019-12-09 DIAGNOSIS — F3132 Bipolar disorder, current episode depressed, moderate: Secondary | ICD-10-CM | POA: Diagnosis not present

## 2019-12-09 DIAGNOSIS — F411 Generalized anxiety disorder: Secondary | ICD-10-CM

## 2019-12-09 DIAGNOSIS — F900 Attention-deficit hyperactivity disorder, predominantly inattentive type: Secondary | ICD-10-CM | POA: Diagnosis not present

## 2019-12-09 MED ORDER — AMITRIPTYLINE HCL 100 MG PO TABS: 100.0000 mg | ORAL_TABLET | Freq: Every day | ORAL | 2 refills | Status: DC

## 2019-12-09 MED ORDER — AMPHETAMINE-DEXTROAMPHETAMINE 15 MG PO TABS: ORAL_TABLET | ORAL | 0 refills | Status: DC

## 2019-12-09 MED ORDER — AMITRIPTYLINE HCL 25 MG PO TABS: ORAL_TABLET | ORAL | 1 refills | Status: DC

## 2019-12-09 MED ORDER — ALPRAZOLAM 1 MG PO TABS: 1.0000 mg | ORAL_TABLET | Freq: Four times a day (QID) | ORAL | 2 refills | Status: DC

## 2019-12-09 MED ORDER — OLANZAPINE 2.5 MG PO TABS: 2.5000 mg | ORAL_TABLET | Freq: Every day | ORAL | 2 refills | Status: DC

## 2019-12-09 NOTE — Progress Notes (Signed)
Virtual Visit via Video Note  I connected with Samantha Chambers on 12/09/19 at  4:00 PM EDT by a video enabled telemedicine application and verified that I am speaking with the correct person using two identifiers.   I discussed the limitations of evaluation and management by telemedicine and the availability of in person appointments. The patient expressed understanding and agreed to proceed.   I discussed the assessment and treatment plan with the patient. The patient was provided an opportunity to ask questions and all were answered. The patient agreed with the plan and demonstrated an understanding of the instructions.   The patient was advised to call back or seek an in-person evaluation if the symptoms worsen or if the condition fails to improve as anticipated.  I provided 15 minutes of non-face-to-face time during this encounter. Patient: Provider Home, patient home  [name], MD  [facility] MD/PA/NP OP Progress Note  12/09/2019 4:15 PM Samantha Chambers  MRN:  [id number]  Chief Complaint:  Chief Complaint    Anxiety; Depression; Manic Behavior; ADD; Follow-up     HPI: This patient is a 49 year old divorced white female who lives with her 67 year old daughter in [place].  She is on disability.  The patient returns for follow-up after 2 months for treatment of bipolar disorder anxiety and ADHD.  For the most part she is doing fairly well.  She had her daughter have been enjoying a new vehicle that they acquired over the summer and they seem to be getting along better.  She still worries quite a bit about her daughter who can be very sullen and irritable at times.  She states her mood is generally good she is sleeping well and her energy is good.  She denies any thoughts of self-harm or suicide.  She is focusing well with the Adderall Visit Diagnosis:    ICD-10-CM   1. Bipolar 1 disorder, depressed, moderate ([place])  F31.32   2. Generalized anxiety disorder  F41.1   3. Attention deficit  hyperactivity disorder (ADHD), predominantly inattentive type  F90.0     Past Psychiatric History: Long-term outpatient treatment  Past Medical History:  Past Medical History:  Diagnosis Date  . Angio-edema   . Anxiety   . Bipolar disorder ([place])   . Depression   . Headache(784.0)   . Irritable bowel   . Urticaria     Past Surgical History:  Procedure Laterality Date  . FOOT SURGERY      Family Psychiatric History: See below  Family History:  Family History  Problem Relation Age of Onset  . Bipolar disorder Mother   . Depression Maternal Aunt   . Depression Maternal Uncle   . Urticaria Neg Hx   . Immunodeficiency Neg Hx   . Eczema Neg Hx   . Atopy Neg Hx   . Asthma Neg Hx   . Angioedema Neg Hx   . Allergic rhinitis Neg Hx     Social History:  Social History   Socioeconomic History  . Marital status: Divorced    Spouse name: Not on file  . Number of children: Not on file  . Years of education: Not on file  . Highest education level: Not on file  Occupational History  . Not on file  Tobacco Use  . Smoking status: Never Smoker  . Smokeless tobacco: Never Used  Vaping Use  . Vaping Use: Never used  Substance and Sexual Activity  . Alcohol use: No  . Drug use: No  . Sexual  activity: Not Currently    Partners: Male    Birth control/protection: Pill  Other Topics Concern  . Not on file  Social History Narrative  . Not on file   Social Determinants of Health   Financial Resource Strain:   . Difficulty of Paying Living Expenses:   Food Insecurity:   . Worried About Charity fundraiser in the Last Year:   . Arboriculturist in the Last Year:   Transportation Needs:   . Film/video editor (Medical):   Marland Kitchen Lack of Transportation (Non-Medical):   Physical Activity:   . Days of Exercise per Week:   . Minutes of Exercise per Session:   Stress:   . Feeling of Stress :   Social Connections:   . Frequency of Communication with Friends and Family:   .  Frequency of Social Gatherings with Friends and Family:   . Attends Religious Services:   . Active Member of Clubs or Organizations:   . Attends Archivist Meetings:   Marland Kitchen Marital Status:     Allergies:  Allergies  Allergen Reactions  . Clonazepam Nausea And Vomiting  . Sulfa Antibiotics   . Vilazodone     Per Pt med made her sick    Metabolic Disorder Labs: No results found for: HGBA1C, MPG No results found for: PROLACTIN No results found for: CHOL, TRIG, HDL, CHOLHDL, VLDL, LDLCALC No results found for: TSH  Therapeutic Level Labs: No results found for: LITHIUM Lab Results  Component Value Date   VALPROATE 33.8 (L) 03/13/2013   No components found for:  CBMZ  Current Medications: Current Outpatient Medications  Medication Sig Dispense Refill  . ALPRAZolam (XANAX) 1 MG tablet Take 1 tablet (1 mg total) by mouth 4 (four) times daily. 120 tablet 2  . amitriptyline (ELAVIL) 100 MG tablet Take 1 tablet (100 mg total) by mouth at bedtime. 90 tablet 2  . amitriptyline (ELAVIL) 25 MG tablet TAKE 1 TABLET BY MOUTH EVERYDAY AT BEDTIME 90 tablet 1  . amphetamine-dextroamphetamine (ADDERALL) 15 MG tablet Take 2 in the am and 1 at noon 90 tablet 0  . amphetamine-dextroamphetamine (ADDERALL) 15 MG tablet Take 2 in the am and 1 at noon 90 tablet 0  . EPINEPHrine 0.3 mg/0.3 mL IJ SOAJ injection INJECT 0.3 MLS (0.3 MG TOTAL) INTO THE MUSCLE ONCE FOR 1 DOSE.    . famotidine (PEPCID) 20 MG tablet     . OLANZapine (ZYPREXA) 2.5 MG tablet Take 1 tablet (2.5 mg total) by mouth at bedtime. 90 tablet 2  . ORSYTHIA 0.1-20 MG-MCG tablet      No current facility-administered medications for this visit.     Musculoskeletal: Strength & Muscle Tone: within normal limits Gait & Station: normal Patient leans: N/A  Psychiatric Specialty Exam: Review of Systems  All other systems reviewed and are negative.   There were no vitals taken for this visit.There is no height or weight on  file to calculate BMI.  General Appearance: Casual and Fairly Groomed  Eye Contact:  Good  Speech:  Clear and Coherent  Volume:  Normal  Mood:  Euthymic  Affect:  Appropriate and Congruent  Thought Process:  Goal Directed  Orientation:  Full (Time, Place, and Person)  Thought Content: WDL   Suicidal Thoughts:  No  Homicidal Thoughts:  No  Memory:  Immediate;   Good Recent;   Good Remote;   Good  Judgement:  Good  Insight:  Good  Psychomotor Activity:  Normal  Concentration:  Concentration: Good and Attention Span: Good  Recall:  Good  Fund of Knowledge: Good  Language: Good  Akathisia:  No  Handed:  Right  AIMS (if indicated): not done  Assets:  Communication Skills Desire for Improvement Physical Health Resilience Social Support Talents/Skills  ADL's:  Intact  Cognition: WNL  Sleep:  Good   Screenings: MDI     Office Visit from 11/22/2015 in [facility]  Total Score (max 50) 38       Assessment and Plan: Patient is a 49 year old female with a history bipolar disorder ADHD and anxiety.  She continues to do well on her current regimen.  She will continue amitriptyline 125 mg at bedtime for sleep and depression, olanzapine 2.5 mg at bedtime for mood stabilization, Xanax 1 mg up to 4 times daily for anxiety and Adderall 30 mg in the morning and 50 mg at noon for ADD.  She will return to see me in 2 months   [name], MD 12/09/2019, 4:15 PM

## 2019-12-22 ENCOUNTER — Ambulatory Visit: Payer: Medicare HMO | Admitting: Gastroenterology

## 2019-12-23 ENCOUNTER — Encounter: Payer: Self-pay | Admitting: Nurse Practitioner

## 2019-12-23 ENCOUNTER — Telehealth (INDEPENDENT_AMBULATORY_CARE_PROVIDER_SITE_OTHER): Payer: Medicare HMO | Admitting: Nurse Practitioner

## 2019-12-23 DIAGNOSIS — K219 Gastro-esophageal reflux disease without esophagitis: Secondary | ICD-10-CM | POA: Diagnosis not present

## 2019-12-23 DIAGNOSIS — K5900 Constipation, unspecified: Secondary | ICD-10-CM

## 2019-12-23 MED ORDER — PANTOPRAZOLE SODIUM 40 MG PO TBEC: 40.0000 mg | DELAYED_RELEASE_TABLET | Freq: Two times a day (BID) | ORAL | 3 refills | Status: DC

## 2019-12-23 NOTE — Patient Instructions (Signed)
Your health issues we discussed today were:   GERD (reflux/heartburn): 1. I have sent a new prescription to your pharmacy to increase Protonix to 40 mg twice a day 2. Take this pursing in the morning and 30 minutes for your last meal the day 3. As we discussed, try setting alarm on your phone to help remind you to take your medications 4. Call us if you have any worsening or severe symptoms 5. Continue with your dietary changes such as cutting out trigger foods, sodas, significant caffeine  Constipation and/or diarrhea in the setting of known irritable bowel syndrome and increased stress: 1. Try taking Colace 100 mg once a day or once every other day to help promote good, soft, regular bowel movements 2. You can use MiraLAX once a day as needed for any breakthrough constipation 3. If you develop diarrhea hold Colace and MiraLAX until your bowels normalize 4. You can use Imodium if you do have diarrhea 5. Call us for any worsening or severe symptoms  Overall I recommend:  1. Continue your other current medications 2. Return for follow-up in 6 to 8 weeks 3. Call us if you have any questions or concerns   ---------------------------------------------------------------  I am glad you have gotten your COVID-19 vaccination!  Even though you are fully vaccinated you should continue to follow CDC and state/local guidelines.  ---------------------------------------------------------------   At [facility] we value your feedback. You may receive a survey about your visit today. Please share your experience as we strive to create trusting relationships with our patients to provide genuine, compassionate, quality care.  We appreciate your understanding and patience as we review any laboratory studies, imaging, and other diagnostic tests that are ordered as we care for you. Our office policy is 5 business days for review of these results, and any emergent or urgent results are  addressed in a timely manner for your best interest. If you do not hear from our office in 1 week, please contact us.   We also encourage the use of MyChart, which contains your medical information for your review as well. If you are not enrolled in this feature, an access code is on this after visit summary for your convenience. Thank you for allowing [place] to be involved in your care.  It was great to see you today!  I hope you have a great rest of your summer!!

## 2019-12-23 NOTE — Progress Notes (Signed)
Referring Provider: Monico Blitz, MD Primary Care Physician:  Monico Blitz, MD Primary GI:  Dr. Abbey Chatters  NOTE: Service was provided via telemedicine and was requested by the patient due to COVID-19 pandemic.  Patient Location: Home  Provider Location: [place] office  Reason for Phone Visit: New Patient Evaluation  Persons present on the phone encounter, with roles: Patient, myself (provider), Zara Council, LPN (updated meds and allergies)  Total time (minutes) spent on medical discussion: 35 minutes  Due to COVID-19, visit was conducted using the virtual method noted. Visit was requested by patient.  I connected with Samantha Chambers on 12/23/19 at  3:30 PM EDT by Video Visit and verified that I am speaking with the correct person using two identifiers.   I discussed the limitations, risks, security and privacy concerns of performing an evaluation and management service by telephone and the availability of in person appointments. I also discussed with the patient that there may be a patient responsible charge related to this service. The patient expressed understanding and agreed to proceed.  Chief Complaint  Patient presents with  . Gastroesophageal Reflux    HPI:   Samantha Chambers is a 49 y.o. female who presents on referral from primary care for GERD.  Reviewed information provided with referral including office visit dated 10/23/2019 for persistent, intermittent upper abdominal pain described as burning, cramping, pressure, and fullness.  Also associated with belching, bloating, constipation, nausea, vomiting.  Patient on PPI and Pepcid, following low-fat no fried or spicy food diet.  Recommended referral to GI.  It appears patient was started on Protonix 40 mg daily the day of her last office visit.  Previously on Pepcid.  No history of colonoscopy or endoscopy in our system.  Today she states she is doing okay overall. She's doing better then when referred, has started Protonix  and she feels this has helped as well as avoiding trigger foods and late night eating. She has cut out sodas (and subsequently lost about 10 lbs). Has been on famotidine for a while. Has a lot of stress recently with daughter's attempted suicide. Thinks she gave her a stress ulcer around this time. Takes Protonix once a day in the mornings. Takes famotidine in the evenings. She occasionally forgets her medication in the morning (Protonix). Her dog is also likely suffering from a brain tumor and she sounds overwhelmed. She has seen a therapist and has a support system. She has breakthrough not very often. Feels her stomach remains acidic and it helps if she keeps "something on my stomach." She has lost about 10 lbs in the past month. When she's really stressed she doesn't eat well; typically only eats one good sized meal a day and otherwise does a lot of snacking. Abdominal pain and nausea improved. No more vomiting. Has occasional/rare diarrhea when she is really stressed. She has gotten a bit more constipated recently (was dx with IBS 2-3 years ago, typically occurs with stress). She has started eating fiber gummies. Has occasionally used MiraLAX or suppositories (about 3 times in the past 3-4 months). Denies hematochezia, melena, fever, chills. Denies URI or flu-like symptoms. Denies loss of sense of taste or smell. The patient has received COVID-19 vaccination(s). Denies chest pain, dyspnea, dizziness, lightheadedness, syncope, near syncope. Denies any other upper or lower GI symptoms.  Past Medical History:  Diagnosis Date  . Angio-edema   . Anxiety   . Bipolar disorder ([place])   . Depression   . Headache(784.0)   .  Irritable bowel   . Urticaria     Past Surgical History:  Procedure Laterality Date  . FOOT SURGERY      Current Outpatient Medications  Medication Sig Dispense Refill  . ALPRAZolam (XANAX) 1 MG tablet Take 1 tablet (1 mg total) by mouth 4 (four) times daily. (Patient taking  differently: Take 1 mg by mouth 4 (four) times daily as needed. ) 120 tablet 2  . amitriptyline (ELAVIL) 100 MG tablet Take 1 tablet (100 mg total) by mouth at bedtime. 90 tablet 2  . amitriptyline (ELAVIL) 25 MG tablet TAKE 1 TABLET BY MOUTH EVERYDAY AT BEDTIME 90 tablet 1  . amphetamine-dextroamphetamine (ADDERALL) 15 MG tablet Take 2 in the am and 1 at noon 90 tablet 0  . amphetamine-dextroamphetamine (ADDERALL) 15 MG tablet Take 2 in the am and 1 at noon 90 tablet 0  . EPINEPHrine 0.3 mg/0.3 mL IJ SOAJ injection INJECT 0.3 MLS (0.3 MG TOTAL) INTO THE MUSCLE ONCE FOR 1 DOSE.    . famotidine (PEPCID) 20 MG tablet Take 20 mg by mouth every evening.     Marland Kitchen OLANZapine (ZYPREXA) 2.5 MG tablet Take 1 tablet (2.5 mg total) by mouth at bedtime. 90 tablet 2  . rosuvastatin (CRESTOR) 5 MG tablet Take 1 tablet by mouth daily.    . pantoprazole (PROTONIX) 40 MG tablet Take 1 tablet (40 mg total) by mouth 2 (two) times daily before a meal. 60 tablet 3   No current facility-administered medications for this visit.    Allergies as of 12/23/2019 - Review Complete 12/23/2019  Allergen Reaction Noted  . Clonazepam Nausea And Vomiting 02/13/2013  . Sulfa antibiotics  02/13/2013  . Vilazodone  12/25/2013    Family History  Problem Relation Age of Onset  . Bipolar disorder Mother   . Depression Maternal Aunt   . Depression Maternal Uncle   . Urticaria Neg Hx   . Immunodeficiency Neg Hx   . Eczema Neg Hx   . Atopy Neg Hx   . Asthma Neg Hx   . Angioedema Neg Hx   . Allergic rhinitis Neg Hx     Social History   Socioeconomic History  . Marital status: Divorced    Spouse name: Not on file  . Number of children: Not on file  . Years of education: Not on file  . Highest education level: Not on file  Occupational History  . Not on file  Tobacco Use  . Smoking status: Never Smoker  . Smokeless tobacco: Never Used  Vaping Use  . Vaping Use: Never used  Substance and Sexual Activity  . Alcohol  use: Yes    Comment: Rare glass of wine  . Drug use: Yes    Types: Marijuana    Comment: smokes cannabis for anxiety   . Sexual activity: Not Currently    Partners: Male    Birth control/protection: Pill  Other Topics Concern  . Not on file  Social History Narrative  . Not on file   Social Determinants of Health   Financial Resource Strain:   . Difficulty of Paying Living Expenses:   Food Insecurity:   . Worried About Charity fundraiser in the Last Year:   . Arboriculturist in the Last Year:   Transportation Needs:   . Film/video editor (Medical):   Marland Kitchen Lack of Transportation (Non-Medical):   Physical Activity:   . Days of Exercise per Week:   . Minutes of Exercise per Session:  Stress:   . Feeling of Stress :   Social Connections:   . Frequency of Communication with Friends and Family:   . Frequency of Social Gatherings with Friends and Family:   . Attends Religious Services:   . Active Member of Clubs or Organizations:   . Attends Archivist Meetings:   Marland Kitchen Marital Status:     Review of Systems: Review of Systems  Constitutional: Negative for chills, fever, malaise/fatigue and weight loss.  HENT: Negative for congestion and sore throat.   Respiratory: Negative for cough and shortness of breath.   Cardiovascular: Negative for chest pain and palpitations.  Gastrointestinal: Negative for abdominal pain, blood in stool, diarrhea, melena, nausea and vomiting.  Musculoskeletal: Negative for joint pain and myalgias.  Skin: Negative for rash.  Neurological: Negative for dizziness and weakness.  Endo/Heme/Allergies: Does not bruise/bleed easily.  Psychiatric/Behavioral: Negative for depression. The patient is not nervous/anxious.   All other systems reviewed and are negative.   Physical Exam: Note: limited exam due to virtual visit There were no vitals taken for this visit. Physical Exam Nursing note reviewed.  Constitutional:      General: She is not  in acute distress.    Appearance: Normal appearance. She is well-developed. She is not ill-appearing, toxic-appearing or diaphoretic.  HENT:     Head: Normocephalic and atraumatic.     Nose: No congestion or rhinorrhea.  Eyes:     General: No scleral icterus. Pulmonary:     Effort: Pulmonary effort is normal. No respiratory distress.  Abdominal:     General: There is no distension.     Palpations: There is no hepatomegaly or splenomegaly.  Skin:    Coloration: Skin is not jaundiced.     Findings: No rash.  Neurological:     General: No focal deficit present.     Mental Status: She is alert and oriented to person, place, and time.  Psychiatric:        Attention and Perception: Attention normal.        Mood and Affect: Mood normal.        Speech: Speech normal.        Behavior: Behavior normal.        Thought Content: Thought content normal.        Cognition and Memory: Cognition and memory normal.       Assessment: Very pleasant 49 year old female with an unfortunate situation related to high stress with the recent suicide attempt of her daughter.  Since increase stress she has had some consolation symptoms including typical dyspepsia/GERD symptoms including epigastric burning, nausea, vomiting.    Bowel changes: She has had intermittent diarrhea and intermittent constipation with a previous diagnosis of irritable bowel syndrome several years ago.  She is managing this with over-the-counter medications.  Likely stress-induced flare of IBS given her current situation.  GERD: She was started on Protonix 40 mg daily in addition to Pepcid in the evenings.  She has had some improvement likely from the Protonix and significant dietary changes as outlined in the HPI.  I have encouraged her to continue with her dietary changes that have helped her symptoms.  She still feels like she has a "acidic stomach a lot."  We will plan to increase PPI for maximum effect and consider endoscopic  evaluation depending on her clinical response.  Overall, likely stress-induced flare of GERD symptoms.  Weight loss: She is also eating less and losing some weight because of her stress.  I  have recommended she try 4-5 small meals a day rather than 1 large meal and snacks urinary.  Discussed the need to "eat to live".   Plan:  1. Increase Protonix to 40 mg twice daily 2. Hold Pepcid for now 3. Colace once a day or every other day to help have consistent, regular bowel movements 4. MiraLAX once daily as needed 5. Hold both Colace and/or MiraLAX for any episodes of diarrhea 6. Titrate bowel regimen medications based on clinical response 7. Follow-up in 6 to 8 weeks 8. Call for any worsening symptoms.     Thank you for allowing [place] to participate in the care of [place], DNP, AGNP-C Adult & Gerontological Nurse Practitioner [facility]

## 2019-12-24 ENCOUNTER — Telehealth: Payer: Self-pay | Admitting: *Deleted

## 2019-12-24 ENCOUNTER — Encounter: Payer: Self-pay | Admitting: Internal Medicine

## 2019-12-24 NOTE — Telephone Encounter (Signed)
Pt consented to a virtual visit on 12/23/2019.

## 2019-12-24 NOTE — Telephone Encounter (Signed)
Samantha Chambers, you are scheduled for a virtual visit with your provider today.  Just as we do with appointments in the office, we must obtain your consent to participate.  Your consent will be active for this visit and any virtual visit you may have with one of our providers in the next 365 days.  If you have a MyChart account, I can also send a copy of this consent to you electronically.  All virtual visits are billed to your insurance company just like a traditional visit in the office.  As this is a virtual visit, video technology does not allow for your provider to perform a traditional examination.  This may limit your provider's ability to fully assess your condition.  If your provider identifies any concerns that need to be evaluated in person or the need to arrange testing such as labs, EKG, etc, we will make arrangements to do so.  Although advances in technology are sophisticated, we cannot ensure that it will always work on either your end or our end.  If the connection with a video visit is poor, we may have to switch to a telephone visit.  With either a video or telephone visit, we are not always able to ensure that we have a secure connection.   I need to obtain your verbal consent now.   Are you willing to proceed with your visit today?

## 2020-01-05 ENCOUNTER — Encounter: Payer: Self-pay | Admitting: Internal Medicine

## 2020-01-14 DIAGNOSIS — Z1231 Encounter for screening mammogram for malignant neoplasm of breast: Secondary | ICD-10-CM | POA: Diagnosis not present

## 2020-01-15 ENCOUNTER — Telehealth (HOSPITAL_COMMUNITY): Payer: Self-pay | Admitting: *Deleted

## 2020-01-15 NOTE — Telephone Encounter (Signed)
Informed patient with information and she verbalized understanding. Per pt she will just wait.

## 2020-01-15 NOTE — Telephone Encounter (Signed)
The patient Discharged in July from Outpatient. If the patient feels she needs additional services she would need to restart the program as a new patient meaning a new CCA and Treatment plan. She can not get a auth for a few sessions she would need to restart the process and she would have an additional 5-6 months in treatment just like a new patient. Typically insurance providers prefer to see at least 6 weeks between Outpatient Treatments the patient has been out of treatment for long enough to schedule and restart if she chooses.  Thanks [employer]

## 2020-01-15 NOTE — Telephone Encounter (Signed)
Pt called asking if she need auth for couple of sessions or can she schedule with you due to wanting to sch to talk to you.

## 2020-01-28 DIAGNOSIS — H5203 Hypermetropia, bilateral: Secondary | ICD-10-CM | POA: Diagnosis not present

## 2020-01-28 DIAGNOSIS — H524 Presbyopia: Secondary | ICD-10-CM | POA: Diagnosis not present

## 2020-02-08 DIAGNOSIS — F319 Bipolar disorder, unspecified: Secondary | ICD-10-CM | POA: Diagnosis not present

## 2020-02-08 DIAGNOSIS — R933 Abnormal findings on diagnostic imaging of other parts of digestive tract: Secondary | ICD-10-CM | POA: Diagnosis not present

## 2020-02-08 DIAGNOSIS — Z299 Encounter for prophylactic measures, unspecified: Secondary | ICD-10-CM | POA: Diagnosis not present

## 2020-02-08 DIAGNOSIS — R1031 Right lower quadrant pain: Secondary | ICD-10-CM | POA: Diagnosis not present

## 2020-02-08 DIAGNOSIS — Z0000 Encounter for general adult medical examination without abnormal findings: Secondary | ICD-10-CM | POA: Diagnosis not present

## 2020-02-08 DIAGNOSIS — R1030 Lower abdominal pain, unspecified: Secondary | ICD-10-CM | POA: Diagnosis not present

## 2020-02-08 DIAGNOSIS — R109 Unspecified abdominal pain: Secondary | ICD-10-CM | POA: Diagnosis not present

## 2020-02-09 ENCOUNTER — Telehealth (INDEPENDENT_AMBULATORY_CARE_PROVIDER_SITE_OTHER): Payer: Medicare HMO | Admitting: Psychiatry

## 2020-02-09 ENCOUNTER — Other Ambulatory Visit: Payer: Self-pay

## 2020-02-09 ENCOUNTER — Encounter (HOSPITAL_COMMUNITY): Payer: Self-pay | Admitting: Psychiatry

## 2020-02-09 DIAGNOSIS — F411 Generalized anxiety disorder: Secondary | ICD-10-CM | POA: Diagnosis not present

## 2020-02-09 DIAGNOSIS — F3132 Bipolar disorder, current episode depressed, moderate: Secondary | ICD-10-CM | POA: Diagnosis not present

## 2020-02-09 DIAGNOSIS — F900 Attention-deficit hyperactivity disorder, predominantly inattentive type: Secondary | ICD-10-CM | POA: Diagnosis not present

## 2020-02-09 MED ORDER — AMPHETAMINE-DEXTROAMPHETAMINE 15 MG PO TABS
ORAL_TABLET | ORAL | 0 refills | Status: DC
Start: 2020-02-09 — End: 2020-04-06

## 2020-02-09 MED ORDER — OLANZAPINE 2.5 MG PO TABS: 2.5000 mg | ORAL_TABLET | Freq: Every day | ORAL | 2 refills | Status: DC

## 2020-02-09 MED ORDER — ALPRAZOLAM 1 MG PO TABS
1.0000 mg | ORAL_TABLET | Freq: Four times a day (QID) | ORAL | 2 refills | Status: DC | PRN
Start: 2020-02-09 — End: 2020-04-06

## 2020-02-09 MED ORDER — AMITRIPTYLINE HCL 25 MG PO TABS
ORAL_TABLET | ORAL | 1 refills | Status: DC
Start: 2020-02-09 — End: 2020-04-06

## 2020-02-09 MED ORDER — AMITRIPTYLINE HCL 100 MG PO TABS
100.0000 mg | ORAL_TABLET | Freq: Every day | ORAL | 2 refills | Status: DC
Start: 2020-02-09 — End: 2020-04-06

## 2020-02-09 NOTE — Progress Notes (Signed)
Virtual Visit via Video Note  I connected with Samantha Chambers on 02/09/20 at  4:00 PM EDT by a video enabled telemedicine application and verified that I am speaking with the correct person using two identifiers.   I discussed the limitations of evaluation and management by telemedicine and the availability of in person appointments. The patient expressed understanding and agreed to proceed.    I discussed the assessment and treatment plan with the patient. The patient was provided an opportunity to ask questions and all were answered. The patient agreed with the plan and demonstrated an understanding of the instructions.   The patient was advised to call back or seek an in-person evaluation if the symptoms worsen or if the condition fails to improve as anticipated.  I provided 15 minutes of non-face-to-face time during this encounter. Location: Provider office, patient home  [name], MD  [facility] MD/PA/NP OP Progress Note  02/09/2020 4:36 PM Samantha Chambers  MRN:  [id number]  Chief Complaint:  Chief Complaint    Anxiety; Depression; ADD; Manic Behavior     HPI: This patient is a 49 year old divorced white female who lives with her 3 year old daughter in [place].  She is on disability.  The patient returns after 2 months for treatment of bipolar disorder anxiety and ADHD.  For the most part she has been doing okay.  She was recently diagnosed with diverticulitis which she has had in the past.  She still worries a lot about her daughter who has problems with mood swings and depression.  However she states she is sleeping well her mood has been stable and she is focusing fairly well most of the time.  She denies thoughts of self-harm or suicide Visit Diagnosis:    ICD-10-CM   1. Bipolar 1 disorder, depressed, moderate ([place])  F31.32   2. Generalized anxiety disorder  F41.1   3. Attention deficit hyperactivity disorder (ADHD), predominantly inattentive type  F90.0     Past Psychiatric  History: Long-term outpatient treatment  Past Medical History:  Past Medical History:  Diagnosis Date   Angio-edema    Anxiety    Bipolar disorder ([place])    Depression    Headache(784.0)    Irritable bowel    Urticaria     Past Surgical History:  Procedure Laterality Date   FOOT SURGERY      Family Psychiatric History: see below  Family History:  Family History  Problem Relation Age of Onset   Bipolar disorder Mother    Depression Maternal Aunt    Depression Maternal Uncle    Urticaria Neg Hx    Immunodeficiency Neg Hx    Eczema Neg Hx    Atopy Neg Hx    Asthma Neg Hx    Angioedema Neg Hx    Allergic rhinitis Neg Hx     Social History:  Social History   Socioeconomic History   Marital status: Divorced    Spouse name: Not on file   Number of children: Not on file   Years of education: Not on file   Highest education level: Not on file  Occupational History   Not on file  Tobacco Use   Smoking status: Never Smoker   Smokeless tobacco: Never Used  Vaping Use   Vaping Use: Never used  Substance and Sexual Activity   Alcohol use: Yes    Comment: Rare glass of wine   Drug use: Yes    Types: Marijuana    Comment: smokes cannabis for anxiety  Sexual activity: Not Currently    Partners: Male    Birth control/protection: Pill  Other Topics Concern   Not on file  Social History Narrative   Not on file   Social Determinants of Health   Financial Resource Strain:    Difficulty of Paying Living Expenses: Not on file  Food Insecurity:    Worried About [place] in the Last Year: Not on file   Ran Out of Food in the Last Year: Not on file  Transportation Needs:    Lack of Transportation (Medical): Not on file   Lack of Transportation (Non-Medical): Not on file  Physical Activity:    Days of Exercise per Week: Not on file   Minutes of Exercise per Session: Not on file  Stress:    Feeling of Stress : Not on  file  Social Connections:    Frequency of Communication with Friends and Family: Not on file   Frequency of Social Gatherings with Friends and Family: Not on file   Attends Religious Services: Not on file   Active Member of Clubs or Organizations: Not on file   Attends Archivist Meetings: Not on file   Marital Status: Not on file    Allergies:  Allergies  Allergen Reactions   Clonazepam Nausea And Vomiting   Sulfa Antibiotics    Vilazodone     Per Pt med made her sick    Metabolic Disorder Labs: No results found for: HGBA1C, MPG No results found for: PROLACTIN No results found for: CHOL, TRIG, HDL, CHOLHDL, VLDL, LDLCALC No results found for: TSH  Therapeutic Level Labs: No results found for: LITHIUM Lab Results  Component Value Date   VALPROATE 33.8 (L) 03/13/2013   No components found for:  CBMZ  Current Medications: Current Outpatient Medications  Medication Sig Dispense Refill   ALPRAZolam (XANAX) 1 MG tablet Take 1 tablet (1 mg total) by mouth 4 (four) times daily as needed. 120 tablet 2   amitriptyline (ELAVIL) 100 MG tablet Take 1 tablet (100 mg total) by mouth at bedtime. 90 tablet 2   amitriptyline (ELAVIL) 25 MG tablet TAKE 1 TABLET BY MOUTH EVERYDAY AT BEDTIME 90 tablet 1   amphetamine-dextroamphetamine (ADDERALL) 15 MG tablet Take 2 in the am and 1 at noon 90 tablet 0   amphetamine-dextroamphetamine (ADDERALL) 15 MG tablet Take 2 in the am and 1 at noon 90 tablet 0   EPINEPHrine 0.3 mg/0.3 mL IJ SOAJ injection INJECT 0.3 MLS (0.3 MG TOTAL) INTO THE MUSCLE ONCE FOR 1 DOSE.     famotidine (PEPCID) 20 MG tablet Take 20 mg by mouth every evening.      OLANZapine (ZYPREXA) 2.5 MG tablet Take 1 tablet (2.5 mg total) by mouth at bedtime. 90 tablet 2   pantoprazole (PROTONIX) 40 MG tablet Take 1 tablet (40 mg total) by mouth 2 (two) times daily before a meal. 60 tablet 3   rosuvastatin (CRESTOR) 5 MG tablet Take 1 tablet by mouth daily.      No current facility-administered medications for this visit.     Musculoskeletal: Strength & Muscle Tone: within normal limits Gait & Station: normal Patient leans: N/A  Psychiatric Specialty Exam: Review of Systems  Gastrointestinal: Positive for abdominal pain.  All other systems reviewed and are negative.   There were no vitals taken for this visit.There is no height or weight on file to calculate BMI.  General Appearance: Casual and Fairly Groomed  Eye Contact:  Good  Speech:  Clear and Coherent  Volume:  Normal  Mood:  Euthymic  Affect:  Appropriate and Congruent  Thought Process:  Goal Directed  Orientation:  Full (Time, Place, and Person)  Thought Content: Rumination   Suicidal Thoughts:  No  Homicidal Thoughts:  No  Memory:  Immediate;   Good Recent;   Good Remote;   Fair  Judgement:  Good  Insight:  Good  Psychomotor Activity:  Normal  Concentration:  Concentration: Good and Attention Span: Good  Recall:  Good  Fund of Knowledge: Good  Language: Good  Akathisia:  No  Handed:  Right  AIMS (if indicated): not done  Assets:  Communication Skills Desire for Improvement Physical Health Resilience Social Support Talents/Skills  ADL's:  Intact  Cognition: WNL  Sleep:  Good   Screenings: MDI     Office Visit from 11/22/2015 in [place] [facility]  Total Score (max 50) 38       Assessment and Plan: This patient is a 49 year old female with a history of bipolar disorder ADHD and anxiety.  She is continuing to do well on her current regimen.  She will continue amitriptyline 125 mg at bedtime for sleep and depression, olanzapine 2.5 mg at bedtime for mood stabilization, Xanax 1 mg up to 4 times daily for anxiety, Adderall 30 mg in the morning and 15 mg at noon for ADD.  She will return to see me in 2 months   [name], MD 02/09/2020, 4:36 PM

## 2020-02-16 DIAGNOSIS — F319 Bipolar disorder, unspecified: Secondary | ICD-10-CM | POA: Diagnosis not present

## 2020-02-16 DIAGNOSIS — Z299 Encounter for prophylactic measures, unspecified: Secondary | ICD-10-CM | POA: Diagnosis not present

## 2020-02-16 DIAGNOSIS — G43909 Migraine, unspecified, not intractable, without status migrainosus: Secondary | ICD-10-CM | POA: Diagnosis not present

## 2020-02-16 DIAGNOSIS — K5792 Diverticulitis of intestine, part unspecified, without perforation or abscess without bleeding: Secondary | ICD-10-CM | POA: Diagnosis not present

## 2020-02-23 ENCOUNTER — Ambulatory Visit: Payer: Medicare HMO | Admitting: Nurse Practitioner

## 2020-02-24 ENCOUNTER — Ambulatory Visit: Payer: Medicare HMO | Admitting: Internal Medicine

## 2020-03-04 DIAGNOSIS — E538 Deficiency of other specified B group vitamins: Secondary | ICD-10-CM | POA: Diagnosis not present

## 2020-03-04 DIAGNOSIS — E7849 Other hyperlipidemia: Secondary | ICD-10-CM | POA: Diagnosis not present

## 2020-03-04 DIAGNOSIS — K219 Gastro-esophageal reflux disease without esophagitis: Secondary | ICD-10-CM | POA: Diagnosis not present

## 2020-04-05 DIAGNOSIS — Z1159 Encounter for screening for other viral diseases: Secondary | ICD-10-CM | POA: Diagnosis not present

## 2020-04-06 ENCOUNTER — Encounter (HOSPITAL_COMMUNITY): Payer: Self-pay | Admitting: Psychiatry

## 2020-04-06 ENCOUNTER — Telehealth (INDEPENDENT_AMBULATORY_CARE_PROVIDER_SITE_OTHER): Payer: Medicare HMO | Admitting: Psychiatry

## 2020-04-06 ENCOUNTER — Other Ambulatory Visit: Payer: Self-pay

## 2020-04-06 DIAGNOSIS — F411 Generalized anxiety disorder: Secondary | ICD-10-CM

## 2020-04-06 DIAGNOSIS — F900 Attention-deficit hyperactivity disorder, predominantly inattentive type: Secondary | ICD-10-CM

## 2020-04-06 DIAGNOSIS — F3132 Bipolar disorder, current episode depressed, moderate: Secondary | ICD-10-CM | POA: Diagnosis not present

## 2020-04-06 MED ORDER — AMITRIPTYLINE HCL 25 MG PO TABS
ORAL_TABLET | ORAL | 1 refills | Status: DC
Start: 2020-04-06 — End: 2020-05-30

## 2020-04-06 MED ORDER — AMPHETAMINE-DEXTROAMPHETAMINE 15 MG PO TABS
ORAL_TABLET | ORAL | 0 refills | Status: DC
Start: 2020-04-06 — End: 2020-05-30

## 2020-04-06 MED ORDER — OLANZAPINE 2.5 MG PO TABS: 2.5000 mg | ORAL_TABLET | Freq: Every day | ORAL | 2 refills | Status: DC

## 2020-04-06 MED ORDER — AMITRIPTYLINE HCL 100 MG PO TABS
100.0000 mg | ORAL_TABLET | Freq: Every day | ORAL | 2 refills | Status: DC
Start: 2020-04-06 — End: 2020-05-30

## 2020-04-06 MED ORDER — ALPRAZOLAM 1 MG PO TABS
1.0000 mg | ORAL_TABLET | Freq: Four times a day (QID) | ORAL | 2 refills | Status: DC | PRN
Start: 2020-04-06 — End: 2020-06-06

## 2020-04-06 NOTE — Progress Notes (Signed)
Virtual Visit via Telephone Note  I connected with Samantha Chambers on 04/06/20 at  4:00 PM EST by telephone and verified that I am speaking with the correct person using two identifiers.  Location: Patient: home Provider: office   I discussed the limitations, risks, security and privacy concerns of performing an evaluation and management service by telephone and the availability of in person appointments. I also discussed with the patient that there may be a patient responsible charge related to this service. The patient expressed understanding and agreed to proceed.     I discussed the assessment and treatment plan with the patient. The patient was provided an opportunity to ask questions and all were answered. The patient agreed with the plan and demonstrated an understanding of the instructions.   The patient was advised to call back or seek an in-person evaluation if the symptoms worsen or if the condition fails to improve as anticipated.  I provided 15 minutes of non-face-to-face time during this encounter.   [name], MD  [facility] MD/PA/NP OP Progress Note  04/06/2020 4:18 PM Samantha Chambers  MRN:  [id number]  Chief Complaint:  Chief Complaint    Depression; Manic Behavior; Follow-up     HPI: This patient is a 49 year old divorced white female who lives with her 39 year old daughter in [place].  She is on disability  The patient returns after 2 months for treatment of bipolar disorder anxiety and ADHD.  For the most part she is doing okay.  She has some sort of virus or cold right now and has been resting a lot.  She is fully vaccinated got tested for Covid and is waiting on the result.  She thinks is probably unlikely.  Her mood has been stable and she denies severe depression mood swings or anxiety right now.  She is sleeping well.  She denies thoughts of self-harm or suicidal ideation.  She has been dealing with her daughter who also has significant mood swings irritability and  defiant behavior. Visit Diagnosis:    ICD-10-CM   1. Bipolar 1 disorder, depressed, moderate ([place])  F31.32   2. Generalized anxiety disorder  F41.1   3. Attention deficit hyperactivity disorder (ADHD), predominantly inattentive type  F90.0     Past Psychiatric History: Long-term outpatient treatment  Past Medical History:  Past Medical History:  Diagnosis Date  . Angio-edema   . Anxiety   . Bipolar disorder ([place])   . Depression   . Headache(784.0)   . Irritable bowel   . Urticaria     Past Surgical History:  Procedure Laterality Date  . FOOT SURGERY      Family Psychiatric History: see below  Family History:  Family History  Problem Relation Age of Onset  . Bipolar disorder Mother   . Depression Maternal Aunt   . Depression Maternal Uncle   . Urticaria Neg Hx   . Immunodeficiency Neg Hx   . Eczema Neg Hx   . Atopy Neg Hx   . Asthma Neg Hx   . Angioedema Neg Hx   . Allergic rhinitis Neg Hx     Social History:  Social History   Socioeconomic History  . Marital status: Divorced    Spouse name: Not on file  . Number of children: Not on file  . Years of education: Not on file  . Highest education level: Not on file  Occupational History  . Not on file  Tobacco Use  . Smoking status: Never Smoker  . Smokeless  tobacco: Never Used  Vaping Use  . Vaping Use: Never used  Substance and Sexual Activity  . Alcohol use: Yes    Comment: Rare glass of wine  . Drug use: Yes    Types: Marijuana    Comment: smokes cannabis for anxiety   . Sexual activity: Not Currently    Partners: Male    Birth control/protection: Pill  Other Topics Concern  . Not on file  Social History Narrative  . Not on file   Social Determinants of Health   Financial Resource Strain:   . Difficulty of Paying Living Expenses: Not on file  Food Insecurity:   . Worried About Charity fundraiser in the Last Year: Not on file  . Ran Out of Food in the Last Year: Not on file   Transportation Needs:   . Lack of Transportation (Medical): Not on file  . Lack of Transportation (Non-Medical): Not on file  Physical Activity:   . Days of Exercise per Week: Not on file  . Minutes of Exercise per Session: Not on file  Stress:   . Feeling of Stress : Not on file  Social Connections:   . Frequency of Communication with Friends and Family: Not on file  . Frequency of Social Gatherings with Friends and Family: Not on file  . Attends Religious Services: Not on file  . Active Member of Clubs or Organizations: Not on file  . Attends Archivist Meetings: Not on file  . Marital Status: Not on file    Allergies:  Allergies  Allergen Reactions  . Clonazepam Nausea And Vomiting  . Sulfa Antibiotics   . Vilazodone     Per Pt med made her sick    Metabolic Disorder Labs: No results found for: HGBA1C, MPG No results found for: PROLACTIN No results found for: CHOL, TRIG, HDL, CHOLHDL, VLDL, LDLCALC No results found for: TSH  Therapeutic Level Labs: No results found for: LITHIUM Lab Results  Component Value Date   VALPROATE 33.8 (L) 03/13/2013   No components found for:  CBMZ  Current Medications: Current Outpatient Medications  Medication Sig Dispense Refill  . ALPRAZolam (XANAX) 1 MG tablet Take 1 tablet (1 mg total) by mouth 4 (four) times daily as needed. 120 tablet 2  . amitriptyline (ELAVIL) 100 MG tablet Take 1 tablet (100 mg total) by mouth at bedtime. 90 tablet 2  . amitriptyline (ELAVIL) 25 MG tablet TAKE 1 TABLET BY MOUTH EVERYDAY AT BEDTIME 90 tablet 1  . amphetamine-dextroamphetamine (ADDERALL) 15 MG tablet Take 2 in the am and 1 at noon 90 tablet 0  . amphetamine-dextroamphetamine (ADDERALL) 15 MG tablet Take 2 in the am and 1 at noon 90 tablet 0  . EPINEPHrine 0.3 mg/0.3 mL IJ SOAJ injection INJECT 0.3 MLS (0.3 MG TOTAL) INTO THE MUSCLE ONCE FOR 1 DOSE.    . famotidine (PEPCID) 20 MG tablet Take 20 mg by mouth every evening.     Marland Kitchen  OLANZapine (ZYPREXA) 2.5 MG tablet Take 1 tablet (2.5 mg total) by mouth at bedtime. 90 tablet 2  . pantoprazole (PROTONIX) 40 MG tablet Take 1 tablet (40 mg total) by mouth 2 (two) times daily before a meal. 60 tablet 3  . rosuvastatin (CRESTOR) 5 MG tablet Take 1 tablet by mouth daily.     No current facility-administered medications for this visit.     Musculoskeletal: Strength & Muscle Tone: within normal limits Gait & Station: normal Patient leans: N/A  Psychiatric Specialty  Exam: Review of Systems  HENT: Positive for congestion.   All other systems reviewed and are negative.   There were no vitals taken for this visit.There is no height or weight on file to calculate BMI.  General Appearance: NA  Eye Contact:  NA  Speech:  Clear and Coherent  Volume:  Normal  Mood:  Euthymic  Affect:  NA  Thought Process:  Goal Directed  Orientation:  Full (Time, Place, and Person)  Thought Content: WDL   Suicidal Thoughts:  No  Homicidal Thoughts:  No  Memory:  Immediate;   Good Recent;   Good Remote;   Good  Judgement:  Good  Insight:  Good  Psychomotor Activity:  Normal  Concentration:  Concentration: Good and Attention Span: Good  Recall:  Good  Fund of Knowledge: Good  Language: Good  Akathisia:  No  Handed:  Right  AIMS (if indicated): not done  Assets:  Communication Skills Desire for Improvement Physical Health Resilience Social Support Talents/Skills  ADL's:  Intact  Cognition: WNL  Sleep:  Good   Screenings: MDI     Office Visit from 11/22/2015 in [facility]  Total Score (max 50) 38       Assessment and Plan: This patient is a 49 year old female with a history of bipolar disorder ADHD and anxiety.  She continues to do well on her current regimen.  She will continue amitriptyline 125 mg at bedtime for sleep and depression, olanzapine 2.5 mg at bedtime for mood stabilization, Xanax 1 mg up to 4 times daily for  anxiety, Adderall 30 mg in the morning and 15 mg at noon for ADD.  She will return to see me in 2 months   [name], MD 04/06/2020, 4:18 PM

## 2020-05-11 DIAGNOSIS — Z1159 Encounter for screening for other viral diseases: Secondary | ICD-10-CM | POA: Diagnosis not present

## 2020-05-30 ENCOUNTER — Telehealth (INDEPENDENT_AMBULATORY_CARE_PROVIDER_SITE_OTHER): Payer: Medicare HMO | Admitting: Psychiatry

## 2020-05-30 ENCOUNTER — Encounter (HOSPITAL_COMMUNITY): Payer: Self-pay | Admitting: Psychiatry

## 2020-05-30 ENCOUNTER — Other Ambulatory Visit: Payer: Self-pay

## 2020-05-30 DIAGNOSIS — F900 Attention-deficit hyperactivity disorder, predominantly inattentive type: Secondary | ICD-10-CM | POA: Diagnosis not present

## 2020-05-30 DIAGNOSIS — F3132 Bipolar disorder, current episode depressed, moderate: Secondary | ICD-10-CM | POA: Diagnosis not present

## 2020-05-30 DIAGNOSIS — F411 Generalized anxiety disorder: Secondary | ICD-10-CM | POA: Diagnosis not present

## 2020-05-30 MED ORDER — AMPHETAMINE-DEXTROAMPHETAMINE 15 MG PO TABS
ORAL_TABLET | ORAL | 0 refills | Status: DC
Start: 2020-05-30 — End: 2020-07-25

## 2020-05-30 MED ORDER — OLANZAPINE 2.5 MG PO TABS: 2.5000 mg | ORAL_TABLET | Freq: Every day | ORAL | 2 refills | Status: DC

## 2020-05-30 MED ORDER — AMPHETAMINE-DEXTROAMPHETAMINE 15 MG PO TABS
ORAL_TABLET | ORAL | 0 refills | Status: DC
Start: 2020-05-30 — End: 2020-05-30

## 2020-05-30 MED ORDER — AMITRIPTYLINE HCL 25 MG PO TABS: ORAL_TABLET | ORAL | 1 refills | Status: DC

## 2020-05-30 MED ORDER — AMITRIPTYLINE HCL 100 MG PO TABS
100.0000 mg | ORAL_TABLET | Freq: Every day | ORAL | 2 refills | Status: DC
Start: 2020-05-30 — End: 2020-07-25

## 2020-05-30 NOTE — Progress Notes (Signed)
[facility] MD/PA/NP OP Progress Note  05/30/2020 4:37 PM Samantha Chambers  MRN:  [id number]  Chief Complaint:  Chief Complaint    Anxiety; Depression; ADD; Manic Behavior; Follow-up     HPI: This patient is a 50 year old divorced white female who lives with her 91 year old daughter in [place].  She is on disability  The patient returns for follow-up after 2 months for treatment of bipolar disorder anxiety and ADHD.  For the most part she has been doing well.  She still has struggles with her 47 year old daughter but it seems to be more manageable at present.  Her daughter's father is stepping in more to set limits and rules.  Overall the patient states she is functioning fairly well.  She is sleeping well at night her energy is fairly good as his focus.  She denies severe depression manic symptoms or anxiety Visit Diagnosis:    ICD-10-CM   1. Bipolar 1 disorder, depressed, moderate ([place])  F31.32   2. Generalized anxiety disorder  F41.1   3. Attention deficit hyperactivity disorder (ADHD), predominantly inattentive type  F90.0     Past Psychiatric History: Long-term outpatient treatment  Past Medical History:  Past Medical History:  Diagnosis Date  . Angio-edema   . Anxiety   . Bipolar disorder ([place])   . Depression   . Headache(784.0)   . Irritable bowel   . Urticaria     Past Surgical History:  Procedure Laterality Date  . FOOT SURGERY      Family Psychiatric History: see below  Family History:  Family History  Problem Relation Age of Onset  . Bipolar disorder Mother   . Depression Maternal Aunt   . Depression Maternal Uncle   . Urticaria Neg Hx   . Immunodeficiency Neg Hx   . Eczema Neg Hx   . Atopy Neg Hx   . Asthma Neg Hx   . Angioedema Neg Hx   . Allergic rhinitis Neg Hx     Social History:  Social History   Socioeconomic History  . Marital status: Divorced    Spouse name: Not on file  . Number of children: Not on file  . Years of education: Not on file  .  Highest education level: Not on file  Occupational History  . Not on file  Tobacco Use  . Smoking status: Never Smoker  . Smokeless tobacco: Never Used  Vaping Use  . Vaping Use: Never used  Substance and Sexual Activity  . Alcohol use: Yes    Comment: Rare glass of wine  . Drug use: Yes    Types: Marijuana    Comment: smokes cannabis for anxiety   . Sexual activity: Not Currently    Partners: Male    Birth control/protection: Pill  Other Topics Concern  . Not on file  Social History Narrative  . Not on file   Social Determinants of Health   Financial Resource Strain: Not on file  Food Insecurity: Not on file  Transportation Needs: Not on file  Physical Activity: Not on file  Stress: Not on file  Social Connections: Not on file    Allergies:  Allergies  Allergen Reactions  . Clonazepam Nausea And Vomiting  . Sulfa Antibiotics   . Vilazodone     Per Pt med made her sick    Metabolic Disorder Labs: No results found for: HGBA1C, MPG No results found for: PROLACTIN No results found for: CHOL, TRIG, HDL, CHOLHDL, VLDL, LDLCALC No results found for: TSH  Therapeutic  Level Labs: No results found for: LITHIUM Lab Results  Component Value Date   VALPROATE 33.8 (L) 03/13/2013   No components found for:  CBMZ  Current Medications: Current Outpatient Medications  Medication Sig Dispense Refill  . ALPRAZolam (XANAX) 1 MG tablet Take 1 tablet (1 mg total) by mouth 4 (four) times daily as needed. 120 tablet 2  . amitriptyline (ELAVIL) 100 MG tablet Take 1 tablet (100 mg total) by mouth at bedtime. 90 tablet 2  . amitriptyline (ELAVIL) 25 MG tablet TAKE 1 TABLET BY MOUTH EVERYDAY AT BEDTIME 90 tablet 1  . amphetamine-dextroamphetamine (ADDERALL) 15 MG tablet Take 2 in the am and 1 at noon 90 tablet 0  . amphetamine-dextroamphetamine (ADDERALL) 15 MG tablet Take 2 in the am and 1 at noon 90 tablet 0  . EPINEPHrine 0.3 mg/0.3 mL IJ SOAJ injection INJECT 0.3 MLS (0.3 MG  TOTAL) INTO THE MUSCLE ONCE FOR 1 DOSE.    . famotidine (PEPCID) 20 MG tablet Take 20 mg by mouth every evening.     Marland Kitchen OLANZapine (ZYPREXA) 2.5 MG tablet Take 1 tablet (2.5 mg total) by mouth at bedtime. 90 tablet 2  . pantoprazole (PROTONIX) 40 MG tablet Take 1 tablet (40 mg total) by mouth 2 (two) times daily before a meal. 60 tablet 3  . rosuvastatin (CRESTOR) 5 MG tablet Take 1 tablet by mouth daily.     No current facility-administered medications for this visit.     Musculoskeletal: Strength & Muscle Tone: within normal limits Gait & Station: normal Patient leans: N/A  Psychiatric Specialty Exam: Review of Systems  All other systems reviewed and are negative.   There were no vitals taken for this visit.There is no height or weight on file to calculate BMI.  General Appearance: Casual and Fairly Groomed  Eye Contact:  Good  Speech:  Clear and Coherent  Volume:  Normal  Mood:  Euthymic  Affect:  Appropriate and Congruent  Thought Process:  Goal Directed  Orientation:  Full (Time, Place, and Person)  Thought Content: WDL   Suicidal Thoughts:  No  Homicidal Thoughts:  No  Memory:  Immediate;   Good Recent;   Good Remote;   Good  Judgement:  Good  Insight:  Fair  Psychomotor Activity:  Normal  Concentration:  Concentration: Good and Attention Span: Good  Recall:  Good  Fund of Knowledge: Good  Language: Good  Akathisia:  No  Handed:  Right  AIMS (if indicated): not done  Assets:  Communication Skills Desire for Improvement Physical Health Resilience Social Support Talents/Skills  ADL's:  Intact  Cognition: WNL  Sleep:  Good   Screenings: MDI   Flowsheet Row Office Visit from 11/22/2015 in [facility] [facility]  Total Score (max 50) 38       Assessment and Plan: This patient is a 50 year old female with a history bipolar disorder ADHD and anxiety.  She continues to do well on her current regimen.  She will continue  amitriptyline 125 mg at bedtime for sleep and depression, olanzapine 2.5 mg at bedtime for mood stabilization, Xanax 1 mg up to 4 times daily for anxiety and Adderall 30 mg in the morning and 50 mg at noon for ADD.  She will return to see me in 2 months   [name], MD 05/30/2020, 4:37 PM

## 2020-06-03 ENCOUNTER — Other Ambulatory Visit: Payer: Self-pay | Admitting: Nurse Practitioner

## 2020-06-06 ENCOUNTER — Other Ambulatory Visit (HOSPITAL_COMMUNITY): Payer: Self-pay | Admitting: Psychiatry

## 2020-06-06 MED ORDER — DIAZEPAM 10 MG PO TABS: 10.0000 mg | ORAL_TABLET | Freq: Four times a day (QID) | ORAL | 2 refills | Status: DC

## 2020-06-08 DIAGNOSIS — Z789 Other specified health status: Secondary | ICD-10-CM | POA: Diagnosis not present

## 2020-06-08 DIAGNOSIS — M1710 Unilateral primary osteoarthritis, unspecified knee: Secondary | ICD-10-CM | POA: Diagnosis not present

## 2020-06-08 DIAGNOSIS — Z23 Encounter for immunization: Secondary | ICD-10-CM | POA: Diagnosis not present

## 2020-06-08 DIAGNOSIS — Z6822 Body mass index (BMI) 22.0-22.9, adult: Secondary | ICD-10-CM | POA: Diagnosis not present

## 2020-06-08 DIAGNOSIS — M25519 Pain in unspecified shoulder: Secondary | ICD-10-CM | POA: Diagnosis not present

## 2020-06-08 DIAGNOSIS — Z299 Encounter for prophylactic measures, unspecified: Secondary | ICD-10-CM | POA: Diagnosis not present

## 2020-06-08 DIAGNOSIS — M791 Myalgia, unspecified site: Secondary | ICD-10-CM | POA: Diagnosis not present

## 2020-06-14 ENCOUNTER — Ambulatory Visit (INDEPENDENT_AMBULATORY_CARE_PROVIDER_SITE_OTHER): Payer: Medicare HMO | Admitting: Clinical

## 2020-06-14 ENCOUNTER — Other Ambulatory Visit: Payer: Self-pay

## 2020-06-14 DIAGNOSIS — F3132 Bipolar disorder, current episode depressed, moderate: Secondary | ICD-10-CM | POA: Diagnosis not present

## 2020-06-14 DIAGNOSIS — F411 Generalized anxiety disorder: Secondary | ICD-10-CM | POA: Diagnosis not present
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
## 2020-07-05 ENCOUNTER — Encounter (HOSPITAL_COMMUNITY): Payer: Self-pay

## 2020-07-05 ENCOUNTER — Ambulatory Visit (INDEPENDENT_AMBULATORY_CARE_PROVIDER_SITE_OTHER): Payer: Medicare HMO | Admitting: Clinical

## 2020-07-05 ENCOUNTER — Other Ambulatory Visit: Payer: Self-pay

## 2020-07-05 DIAGNOSIS — F411 Generalized anxiety disorder: Secondary | ICD-10-CM | POA: Diagnosis not present

## 2020-07-05 DIAGNOSIS — F3132 Bipolar disorder, current episode depressed, moderate: Secondary | ICD-10-CM

## 2020-07-05 NOTE — Progress Notes (Signed)
Virtual Visit via Video Note  I connected withMelissa K Chambers on 07/05/20 at  8:00 AM EDT by a video enabled telemedicine application and verified that I am speaking with the correct person using two identifiers.  Location: Patient: Home Provider: Office  I discussed the limitations of evaluation and management by telemedicine and the availability of in person appointments. The patient expressed understanding and agreed to proceed.    THERAPIST PROGRESS NOTE  Session Time:8:00AM-8:55 AM  Participation Level:Active  Behavioral Response:CasualAlertDepressed  Type of Therapy: Individual Therapy  Treatment Goals addressed: Coping  Interventions:CBT, Strength-based and Supportive  Summary: Samantha Chambers is a 50 y.o. female who presents with Bipolar and Anxiety.The OPT therapist worked with thepatientfor her initialscheduledsession. The OPT therapist utilized Motivational Interviewing to assist in creating therapeutic repore. The patient in the session was engaged and work in Science writer about hertriggers and symptoms over the past few weeksincludinginteractions with her ex-husband, and daughter.The OPT therapist utilized Cognitive Behavioral Therapy through cognitive restructuring as well as worked with the patientonpositive self talk and challenging negative thinking.The OPT therapist worked to  empower the patient to make changes in the home as the authority figure and to reduce conflict in the home with her daughter.   Suicidal/Homicidal:Nowithout intent/plan  Therapist Response: The OPT therapist worked with the patient for the patients scheduled session. The patient was engaged in hersession and gave feedback in relation to triggers, symptoms, and behavior responses over the pastfewweeks. The OPT therapist worked with the patient utilizing an in session Cognitive Behavioral Therapy exercise. The patient was responsive in the  session and verbalized, " Iam going to make changes and control my home environment and stop letting my daughter do whatever she wants and not listen to me".The OPT therapist worked with the patient in challenge negative thinking and making changes to reduce triggering.The OPT therapist will continue to work with the patient in her next scheduled session.  Plan:  Meet again in 2/3 weeks.  Diagnosis:Axis I:Bipolar, Depressed and Generalized Anxiety Disorder  Axis II:No diagnosis  I discussed the assessment and treatment plan with the patient. The patient was provided an opportunity to ask questions and all were answered. The patient agreed with the plan and demonstrated an understanding of the instructions.  The patient was advised to call back or seek an in-person evaluation if the symptoms worsen or if the condition fails to improve as anticipated.  I provided38minutes of non-face-to-face time during this encounter.  Lennox Grumbles, LCSW  07/05/2020

## 2020-07-25 ENCOUNTER — Telehealth (INDEPENDENT_AMBULATORY_CARE_PROVIDER_SITE_OTHER): Payer: Medicare HMO | Admitting: Psychiatry

## 2020-07-25 ENCOUNTER — Encounter (HOSPITAL_COMMUNITY): Payer: Self-pay | Admitting: Psychiatry

## 2020-07-25 ENCOUNTER — Other Ambulatory Visit: Payer: Self-pay

## 2020-07-25 DIAGNOSIS — F900 Attention-deficit hyperactivity disorder, predominantly inattentive type: Secondary | ICD-10-CM | POA: Diagnosis not present

## 2020-07-25 DIAGNOSIS — F3132 Bipolar disorder, current episode depressed, moderate: Secondary | ICD-10-CM

## 2020-07-25 DIAGNOSIS — F411 Generalized anxiety disorder: Secondary | ICD-10-CM

## 2020-07-25 MED ORDER — OLANZAPINE 2.5 MG PO TABS: 2.5000 mg | ORAL_TABLET | Freq: Every day | ORAL | 2 refills | Status: DC

## 2020-07-25 MED ORDER — AMITRIPTYLINE HCL 100 MG PO TABS: 100.0000 mg | ORAL_TABLET | Freq: Every day | ORAL | 2 refills | Status: DC

## 2020-07-25 MED ORDER — ALPRAZOLAM 1 MG PO TABS: 1.0000 mg | ORAL_TABLET | Freq: Four times a day (QID) | ORAL | 2 refills | Status: DC | PRN

## 2020-07-25 MED ORDER — AMPHETAMINE-DEXTROAMPHETAMINE 15 MG PO TABS: ORAL_TABLET | ORAL | 0 refills | Status: DC

## 2020-07-25 NOTE — Progress Notes (Signed)
Virtual Visit via Video Note  I connected with Samantha Chambers on 07/25/20 at  4:00 PM EDT by a video enabled telemedicine application and verified that I am speaking with the correct person using two identifiers.  Location: Patient: home Provider: home   I discussed the limitations of evaluation and management by telemedicine and the availability of in person appointments. The patient expressed understanding and agreed to proceed.    I discussed the assessment and treatment plan with the patient. The patient was provided an opportunity to ask questions and all were answered. The patient agreed with the plan and demonstrated an understanding of the instructions.   The patient was advised to call back or seek an in-person evaluation if the symptoms worsen or if the condition fails to improve as anticipated.  I provided 15 minutes of non-face-to-face time during this encounter.   [name], MD  [facility] MD/PA/NP OP Progress Note  07/25/2020 4:41 PM Samantha Chambers  MRN:  [id number]  Chief Complaint: depression, anxiety, ADD HPI: This patient is a 50 year old divorced white female who lives with her 49 year old daughter in [place]. She is on disability  Patient returns for follow-up after 2 months for treatment of bipolar disorder anxiety and ADHD.  For the most part she has been doing fairly well.  She states that she was having a lot of anxiety dealing with her daughter and her sick dogs.  She is gone back to Xanax instead of Valium and feels that it works better.  She is still struggling with her 14 year old daughter's behavior and mood swings.  Fortunately she was able to access intensive in-home services for her daughter.  The patient has been helping out at a relatives restaurant.  She is enjoying this.  She states that her mood is generally stable and she is sleeping well. Visit Diagnosis:    ICD-10-CM   1. Bipolar 1 disorder, depressed, moderate ([place])  F31.32   2. Generalized  anxiety disorder  F41.1   3. Attention deficit hyperactivity disorder (ADHD), predominantly inattentive type  F90.0     Past Psychiatric History: Long-term outpatient treatment  Past Medical History:  Past Medical History:  Diagnosis Date  . Angio-edema   . Anxiety   . Bipolar disorder ([place])   . Depression   . Headache(784.0)   . Irritable bowel   . Urticaria     Past Surgical History:  Procedure Laterality Date  . FOOT SURGERY      Family Psychiatric History: see below  Family History:  Family History  Problem Relation Age of Onset  . Bipolar disorder Mother   . Depression Maternal Aunt   . Depression Maternal Uncle   . Urticaria Neg Hx   . Immunodeficiency Neg Hx   . Eczema Neg Hx   . Atopy Neg Hx   . Asthma Neg Hx   . Angioedema Neg Hx   . Allergic rhinitis Neg Hx     Social History:  Social History   Socioeconomic History  . Marital status: Divorced    Spouse name: Not on file  . Number of children: Not on file  . Years of education: Not on file  . Highest education level: Not on file  Occupational History  . Not on file  Tobacco Use  . Smoking status: Never Smoker  . Smokeless tobacco: Never Used  Vaping Use  . Vaping Use: Never used  Substance and Sexual Activity  . Alcohol use: Yes    Comment: Rare glass  of wine  . Drug use: Yes    Types: Marijuana    Comment: smokes cannabis for anxiety   . Sexual activity: Not Currently    Partners: Male    Birth control/protection: Pill  Other Topics Concern  . Not on file  Social History Narrative  . Not on file   Social Determinants of Health   Financial Resource Strain: Not on file  Food Insecurity: Not on file  Transportation Needs: Not on file  Physical Activity: Not on file  Stress: Not on file  Social Connections: Not on file    Allergies:  Allergies  Allergen Reactions  . Clonazepam Nausea And Vomiting  . Sulfa Antibiotics   . Vilazodone     Per Pt med made her sick    Metabolic  Disorder Labs: No results found for: HGBA1C, MPG No results found for: PROLACTIN No results found for: CHOL, TRIG, HDL, CHOLHDL, VLDL, LDLCALC No results found for: TSH  Therapeutic Level Labs: No results found for: LITHIUM Lab Results  Component Value Date   VALPROATE 33.8 (L) 03/13/2013   No components found for:  CBMZ  Current Medications: Current Outpatient Medications  Medication Sig Dispense Refill  . ALPRAZolam (XANAX) 1 MG tablet Take 1 tablet (1 mg total) by mouth 4 (four) times daily as needed. 120 tablet 2  . amitriptyline (ELAVIL) 100 MG tablet Take 1 tablet (100 mg total) by mouth at bedtime. 90 tablet 2  . amitriptyline (ELAVIL) 25 MG tablet TAKE 1 TABLET BY MOUTH EVERYDAY AT BEDTIME 90 tablet 1  . amphetamine-dextroamphetamine (ADDERALL) 15 MG tablet Take 2 in the am and 1 at noon 90 tablet 0  . amphetamine-dextroamphetamine (ADDERALL) 15 MG tablet Take 2 in the am and 1 at noon 90 tablet 0  . EPINEPHrine 0.3 mg/0.3 mL IJ SOAJ injection INJECT 0.3 MLS (0.3 MG TOTAL) INTO THE MUSCLE ONCE FOR 1 DOSE.    . famotidine (PEPCID) 20 MG tablet Take 20 mg by mouth every evening.     Marland Kitchen OLANZapine (ZYPREXA) 2.5 MG tablet Take 1 tablet (2.5 mg total) by mouth at bedtime. 90 tablet 2  . pantoprazole (PROTONIX) 40 MG tablet TAKE 1 TABLET (40 MG TOTAL) BY MOUTH 2 (TWO) TIMES DAILY BEFORE A MEAL. 180 tablet 1  . rosuvastatin (CRESTOR) 5 MG tablet Take 1 tablet by mouth daily.     No current facility-administered medications for this visit.     Musculoskeletal: Strength & Muscle Tone: within normal limits Gait & Station: normal Patient leans: N/A  Psychiatric Specialty Exam: Review of Systems  Psychiatric/Behavioral: The patient is nervous/anxious.   All other systems reviewed and are negative.   There were no vitals taken for this visit.There is no height or weight on file to calculate BMI.  General Appearance: Casual and Fairly Groomed  Eye Contact:  Good  Speech:  Clear  and Coherent  Volume:  Normal  Mood:  Anxious and Euthymic  Affect:  Appropriate and Congruent  Thought Process:  Goal Directed  Orientation:  Full (Time, Place, and Person)  Thought Content: Rumination   Suicidal Thoughts:  No  Homicidal Thoughts:  No  Memory:  Immediate;   Good Recent;   Good Remote;   Good  Judgement:  Good  Insight:  Good  Psychomotor Activity:  Normal  Concentration:  Concentration: Good and Attention Span: Good  Recall:  Good  Fund of Knowledge: Good  Language: Good  Akathisia:  No  Handed:  Right  AIMS (if  indicated): not done  Assets:  Communication Skills Desire for Improvement Physical Health Resilience Social Support Talents/Skills  ADL's:  Intact  Cognition: WNL  Sleep:  Good   Screenings: MDI   Flowsheet Row Office Visit from 11/22/2015 in [place] [facility]  Total Score (max 50) 38    PHQ2-9   Flowsheet Row Video Visit from 07/25/2020 in [place] [facility]  PHQ-2 Total Score 0    Flowsheet Row Video Visit from 07/25/2020 in [place]  C-SSRS RISK CATEGORY No Risk       Assessment and Plan: This patient is a 50 year old female with a history of bipolar disorder ADHD and anxiety.  She continues to do well on her regimen although she is gone back to Xanax instead of using the Valium.  She will continue amitriptyline but wants to go down to 100 mg at bedtime so she does not have to take 2 pills with 125 mg dosage.  She will continue olanzapine 2.5 mg at bedtime for mood stabilization, Xanax 1 mg up to 4 times daily for anxiety and Adderall 30 mg in the morning and 15 mg at noon for ADD.  She will return to see me in 2 months   [name], MD 07/25/2020, 4:41 PM

## 2020-07-26 ENCOUNTER — Ambulatory Visit (HOSPITAL_COMMUNITY): Payer: Medicare HMO | Admitting: Clinical

## 2020-08-02 DIAGNOSIS — D1801 Hemangioma of skin and subcutaneous tissue: Secondary | ICD-10-CM | POA: Diagnosis not present

## 2020-08-02 DIAGNOSIS — L570 Actinic keratosis: Secondary | ICD-10-CM | POA: Diagnosis not present

## 2020-08-02 DIAGNOSIS — D239 Other benign neoplasm of skin, unspecified: Secondary | ICD-10-CM | POA: Diagnosis not present

## 2020-08-10 ENCOUNTER — Other Ambulatory Visit: Payer: Self-pay

## 2020-08-10 ENCOUNTER — Ambulatory Visit (INDEPENDENT_AMBULATORY_CARE_PROVIDER_SITE_OTHER): Payer: Medicare HMO | Admitting: Clinical

## 2020-08-10 DIAGNOSIS — F3132 Bipolar disorder, current episode depressed, moderate: Secondary | ICD-10-CM | POA: Diagnosis not present

## 2020-08-10 DIAGNOSIS — F411 Generalized anxiety disorder: Secondary | ICD-10-CM | POA: Diagnosis not present

## 2020-08-10 NOTE — Progress Notes (Signed)
Virtual Visit via Video Note  I connected withMelissa K Potteron 04/6/22at 1:00 PM EDTby a video enabled telemedicine application and verified that I am speaking with the correct person using two identifiers.  Location: Patient: Home Provider: Office  I discussed the limitations of evaluation and management by telemedicine and the availability of in person appointments. The patient expressed understanding and agreed to proceed.  THERAPIST PROGRESS NOTE  Session Time:1:00PM-1:45 PM  Participation Level:Active  Behavioral Response:CasualAlertDepressed  Type of Therapy: Individual Therapy  Treatment Goals addressed: Coping  Interventions:CBT, Strength-based and Supportive  Summary: Samantha Chambers is a 50 y.o. female who presents with Bipolar and Anxiety.The OPT therapist worked with thepatientfor herongoingscheduledsession. The OPT therapist utilized Motivational Interviewing to assist in creating therapeutic repore. The patient in the session was engaged and work in Science writer about hertriggers and symptoms over the past few weeksincludinginteractions with her ex-husband, anddaughter.The OPT therapist utilized Cognitive Behavioral Therapy through cognitive restructuring as well as worked with the patientonpositive self talk and challenging negative thinking.The OPT therapist worked to empower the patient to make changes in the home she has implemented IIH for her daughter to assist in changing negative behavior which has been impacting the patient.  Suicidal/Homicidal:Nowithout intent/plan  Therapist Response: The OPT therapist worked with the patient for the patients scheduled session. The patient was engaged in hersession and gave feedback in relation to triggers, symptoms, and behavior responses over the pastfewweeks. The OPT therapist worked with the patient utilizing an in session Cognitive Behavioral Therapy exercise. The  patient was responsive in the session and verbalized, " Iam [facility] which is a lot to have them in the home 3x a week but any help is better than no help at all".The OPT therapist worked with the patient in challenge negative thinking and making changes to reduce triggering.The OPT therapist willcontinue to work with the patient in her next scheduled session.  Plan: Meet again in 2/3 weeks.  Diagnosis:Axis I:Bipolar, Depressed and Generalized Anxiety Disorder  Axis II:No diagnosis  I discussed the assessment and treatment plan with the patient. The patient was provided an opportunity to ask questions and all were answered. The patient agreed with the plan and demonstrated an understanding of the instructions.  The patient was advised to call back or seek an in-person evaluation if the symptoms worsen or if the condition fails to improve as anticipated.  I provided18minutes of non-face-to-face time during this encounter.  Lennox Grumbles, LCSW  08/10/2020 08/10/2020

## 2020-08-18 ENCOUNTER — Encounter (HOSPITAL_COMMUNITY): Payer: Self-pay

## 2020-09-07 ENCOUNTER — Ambulatory Visit (HOSPITAL_COMMUNITY): Payer: Medicare HMO | Admitting: Clinical

## 2020-09-12 ENCOUNTER — Other Ambulatory Visit (HOSPITAL_COMMUNITY): Payer: Self-pay | Admitting: Psychiatry

## 2020-09-15 DIAGNOSIS — U071 COVID-19: Secondary | ICD-10-CM | POA: Diagnosis not present

## 2020-09-15 DIAGNOSIS — Z299 Encounter for prophylactic measures, unspecified: Secondary | ICD-10-CM | POA: Diagnosis not present

## 2020-09-15 DIAGNOSIS — F319 Bipolar disorder, unspecified: Secondary | ICD-10-CM | POA: Diagnosis not present

## 2020-09-21 ENCOUNTER — Encounter (HOSPITAL_COMMUNITY): Payer: Self-pay | Admitting: Psychiatry

## 2020-09-21 ENCOUNTER — Telehealth (INDEPENDENT_AMBULATORY_CARE_PROVIDER_SITE_OTHER): Payer: Medicare HMO | Admitting: Psychiatry

## 2020-09-21 ENCOUNTER — Other Ambulatory Visit: Payer: Self-pay

## 2020-09-21 DIAGNOSIS — F411 Generalized anxiety disorder: Secondary | ICD-10-CM

## 2020-09-21 DIAGNOSIS — F3132 Bipolar disorder, current episode depressed, moderate: Secondary | ICD-10-CM

## 2020-09-21 MED ORDER — OLANZAPINE 2.5 MG PO TABS: 2.5000 mg | ORAL_TABLET | Freq: Every day | ORAL | 2 refills | Status: DC

## 2020-09-21 MED ORDER — AMITRIPTYLINE HCL 25 MG PO TABS: ORAL_TABLET | ORAL | 1 refills | Status: DC

## 2020-09-21 MED ORDER — ALPRAZOLAM 1 MG PO TABS: 1.0000 mg | ORAL_TABLET | Freq: Four times a day (QID) | ORAL | 2 refills | Status: DC | PRN

## 2020-09-21 MED ORDER — AMITRIPTYLINE HCL 100 MG PO TABS: 100.0000 mg | ORAL_TABLET | Freq: Every day | ORAL | 2 refills | Status: DC

## 2020-09-21 NOTE — Progress Notes (Signed)
Virtual Visit via Video Note  I connected with Samantha Chambers on 09/21/20 at  3:20 PM EDT by a video enabled telemedicine application and verified that I am speaking with the correct person using two identifiers.  Location: Patient: home Provider: home office   I discussed the limitations of evaluation and management by telemedicine and the availability of in person appointments. The patient expressed understanding and agreed to proceed.    I discussed the assessment and treatment plan with the patient. The patient was provided an opportunity to ask questions and all were answered. The patient agreed with the plan and demonstrated an understanding of the instructions.   The patient was advised to call back or seek an in-person evaluation if the symptoms worsen or if the condition fails to improve as anticipated.  I provided 15 minutes of non-face-to-face time during this encounter.   [name], MD  [facility] MD/PA/NP OP Progress Note  09/21/2020 4:01 PM Samantha Chambers  MRN:  [id number]  Chief Complaint:  Chief Complaint    Depression; Manic Behavior; Follow-up     HPI: This patient is a 50 year old divorced white female who lives with her 64 year old daughter in [place]. She is on disability  The patient returns for follow-up after 2 months regarding her bipolar disorder and anxiety.  She states that she had COVID last week and was very fatigued.  She also has been noticing for several weeks that her heart rate stays around 90-100.  She does have an appointment to see her primary physician.  She has not checked her blood pressure.  Consequently she has stopped the Adderall XR and I think this is a good idea for now.  She denies being depressed but is very stressed.  She and her 69 year old daughter are not getting along.  Her daughter has significant problems with depression and has been quite rebellious.  She is receiving intensive in-home services.  At this point the daughter is not  listening to her by her report this is making her life very difficult.  She is going to send her daughter to stay with the daughter's father at least for short period to give her a break.  Other than that she states that her mood is fairly stable. Visit Diagnosis:    ICD-10-CM   1. Bipolar 1 disorder, depressed, moderate ([place])  F31.32   2. Generalized anxiety disorder  F41.1     Past Psychiatric History: Long-term outpatient treatment  Past Medical History:  Past Medical History:  Diagnosis Date  . Angio-edema   . Anxiety   . Bipolar disorder ([place])   . Depression   . Headache(784.0)   . Irritable bowel   . Urticaria     Past Surgical History:  Procedure Laterality Date  . FOOT SURGERY      Family Psychiatric History: see below  Family History:  Family History  Problem Relation Age of Onset  . Bipolar disorder Mother   . Depression Maternal Aunt   . Depression Maternal Uncle   . Urticaria Neg Hx   . Immunodeficiency Neg Hx   . Eczema Neg Hx   . Atopy Neg Hx   . Asthma Neg Hx   . Angioedema Neg Hx   . Allergic rhinitis Neg Hx     Social History:  Social History   Socioeconomic History  . Marital status: Divorced    Spouse name: Not on file  . Number of children: Not on file  . Years of education: Not  on file  . Highest education level: Not on file  Occupational History  . Not on file  Tobacco Use  . Smoking status: Never Smoker  . Smokeless tobacco: Never Used  Vaping Use  . Vaping Use: Never used  Substance and Sexual Activity  . Alcohol use: Yes    Comment: Rare glass of wine  . Drug use: Yes    Types: Marijuana    Comment: smokes cannabis for anxiety   . Sexual activity: Not Currently    Partners: Male    Birth control/protection: Pill  Other Topics Concern  . Not on file  Social History Narrative  . Not on file   Social Determinants of Health   Financial Resource Strain: Not on file  Food Insecurity: Not on file  Transportation Needs: Not  on file  Physical Activity: Not on file  Stress: Not on file  Social Connections: Not on file    Allergies:  Allergies  Allergen Reactions  . Clonazepam Nausea And Vomiting  . Sulfa Antibiotics   . Vilazodone     Per Pt med made her sick    Metabolic Disorder Labs: No results found for: HGBA1C, MPG No results found for: PROLACTIN No results found for: CHOL, TRIG, HDL, CHOLHDL, VLDL, LDLCALC No results found for: TSH  Therapeutic Level Labs: No results found for: LITHIUM Lab Results  Component Value Date   VALPROATE 33.8 (L) 03/13/2013   No components found for:  CBMZ  Current Medications: Current Outpatient Medications  Medication Sig Dispense Refill  . ALPRAZolam (XANAX) 1 MG tablet Take 1 tablet (1 mg total) by mouth 4 (four) times daily as needed. 120 tablet 2  . amitriptyline (ELAVIL) 100 MG tablet Take 1 tablet (100 mg total) by mouth at bedtime. 90 tablet 2  . amitriptyline (ELAVIL) 25 MG tablet TAKE 1 TABLET BY MOUTH EVERYDAY AT BEDTIME 90 tablet 1  . EPINEPHrine 0.3 mg/0.3 mL IJ SOAJ injection INJECT 0.3 MLS (0.3 MG TOTAL) INTO THE MUSCLE ONCE FOR 1 DOSE.    . famotidine (PEPCID) 20 MG tablet Take 20 mg by mouth every evening.     Marland Kitchen OLANZapine (ZYPREXA) 2.5 MG tablet Take 1 tablet (2.5 mg total) by mouth at bedtime. 90 tablet 2  . pantoprazole (PROTONIX) 40 MG tablet TAKE 1 TABLET (40 MG TOTAL) BY MOUTH 2 (TWO) TIMES DAILY BEFORE A MEAL. 180 tablet 1  . rosuvastatin (CRESTOR) 5 MG tablet Take 1 tablet by mouth daily.     No current facility-administered medications for this visit.     Musculoskeletal: Strength & Muscle Tone: within normal limits Gait & Station: normal Patient leans: N/A  Psychiatric Specialty Exam: Review of Systems  Constitutional: Positive for fatigue.  All other systems reviewed and are negative.   There were no vitals taken for this visit.There is no height or weight on file to calculate BMI.  General Appearance: Casual and Fairly  Groomed  Eye Contact:  Good  Speech:  Clear and Coherent  Volume:  Normal  Mood:  Anxious  Affect:  Appropriate and Congruent  Thought Process:  Goal Directed  Orientation:  Full (Time, Place, and Person)  Thought Content: WDL   Suicidal Thoughts:  No  Homicidal Thoughts:  No  Memory:  Immediate;   Good Recent;   Good Remote;   Good  Judgement:  Good  Insight:  Fair  Psychomotor Activity:  Decreased  Concentration:  Concentration: Good and Attention Span: Good  Recall:  Good  Fund of  Knowledge: Good  Language: Good  Akathisia:  No  Handed:  Right  AIMS (if indicated): not done  Assets:  Communication Skills Desire for Improvement Resilience Social Support Talents/Skills  ADL's:  Intact  Cognition: WNL  Sleep:  Good   Screenings: MDI   Flowsheet Row Office Visit from 11/22/2015 in [place] [facility]  Total Score (max 50) 38    PHQ2-9   Flowsheet Row Video Visit from 09/21/2020 in [place] Video Visit from 07/25/2020 in [place] [facility]  PHQ-2 Total Score 0 0    Flowsheet Row Video Visit from 09/21/2020 in [place] [facility] Video Visit from 07/25/2020 in [place] No Risk No Risk       Assessment and Plan: This patient is a 50 year old female with a history of bipolar disorder ADHD and anxiety.  She has stopped the Adderall XR because of elevated heart rate.  I agree with seeing her primary care physician.  For now she will continue Xanax 1 mg up to 4 times daily for anxiety olanzapine 2.5 mg at bedtime for mood stabilization and amitriptyline 100 to 125 mg at bedtime for depression and sleep.  She will return to see me in 2 months   [name], MD 09/21/2020, 4:01 PM

## 2020-10-04 DIAGNOSIS — R059 Cough, unspecified: Secondary | ICD-10-CM | POA: Diagnosis not present

## 2020-10-04 DIAGNOSIS — Z299 Encounter for prophylactic measures, unspecified: Secondary | ICD-10-CM | POA: Diagnosis not present

## 2020-10-04 DIAGNOSIS — R000 Tachycardia, unspecified: Secondary | ICD-10-CM | POA: Diagnosis not present

## 2020-10-04 DIAGNOSIS — R079 Chest pain, unspecified: Secondary | ICD-10-CM | POA: Diagnosis not present

## 2020-10-06 DIAGNOSIS — Z1339 Encounter for screening examination for other mental health and behavioral disorders: Secondary | ICD-10-CM | POA: Diagnosis not present

## 2020-10-06 DIAGNOSIS — Z79899 Other long term (current) drug therapy: Secondary | ICD-10-CM | POA: Diagnosis not present

## 2020-10-06 DIAGNOSIS — Z6821 Body mass index (BMI) 21.0-21.9, adult: Secondary | ICD-10-CM | POA: Diagnosis not present

## 2020-10-06 DIAGNOSIS — F319 Bipolar disorder, unspecified: Secondary | ICD-10-CM | POA: Diagnosis not present

## 2020-10-06 DIAGNOSIS — Z0000 Encounter for general adult medical examination without abnormal findings: Secondary | ICD-10-CM | POA: Diagnosis not present

## 2020-10-06 DIAGNOSIS — Z1331 Encounter for screening for depression: Secondary | ICD-10-CM | POA: Diagnosis not present

## 2020-10-06 DIAGNOSIS — E7800 Pure hypercholesterolemia, unspecified: Secondary | ICD-10-CM | POA: Diagnosis not present

## 2020-10-06 DIAGNOSIS — Z7189 Other specified counseling: Secondary | ICD-10-CM | POA: Diagnosis not present

## 2020-10-06 DIAGNOSIS — Z299 Encounter for prophylactic measures, unspecified: Secondary | ICD-10-CM | POA: Diagnosis not present

## 2020-10-25 DIAGNOSIS — Z1211 Encounter for screening for malignant neoplasm of colon: Secondary | ICD-10-CM | POA: Diagnosis not present

## 2020-10-25 DIAGNOSIS — Z1212 Encounter for screening for malignant neoplasm of rectum: Secondary | ICD-10-CM | POA: Diagnosis not present

## 2020-10-30 LAB — COLOGUARD: COLOGUARD: NEGATIVE

## 2020-11-21 DIAGNOSIS — R9431 Abnormal electrocardiogram [ECG] [EKG]: Secondary | ICD-10-CM | POA: Diagnosis not present

## 2020-11-21 DIAGNOSIS — Z8249 Family history of ischemic heart disease and other diseases of the circulatory system: Secondary | ICD-10-CM | POA: Diagnosis not present

## 2020-11-21 DIAGNOSIS — E782 Mixed hyperlipidemia: Secondary | ICD-10-CM | POA: Diagnosis not present

## 2020-11-21 DIAGNOSIS — R000 Tachycardia, unspecified: Secondary | ICD-10-CM | POA: Diagnosis not present

## 2020-11-21 DIAGNOSIS — R002 Palpitations: Secondary | ICD-10-CM | POA: Diagnosis not present

## 2020-12-06 DIAGNOSIS — R002 Palpitations: Secondary | ICD-10-CM | POA: Diagnosis not present

## 2020-12-06 DIAGNOSIS — R9431 Abnormal electrocardiogram [ECG] [EKG]: Secondary | ICD-10-CM | POA: Diagnosis not present

## 2020-12-06 DIAGNOSIS — R000 Tachycardia, unspecified: Secondary | ICD-10-CM | POA: Diagnosis not present

## 2020-12-12 DIAGNOSIS — Z299 Encounter for prophylactic measures, unspecified: Secondary | ICD-10-CM | POA: Diagnosis not present

## 2020-12-12 DIAGNOSIS — M7551 Bursitis of right shoulder: Secondary | ICD-10-CM | POA: Diagnosis not present

## 2020-12-13 ENCOUNTER — Other Ambulatory Visit: Payer: Self-pay

## 2020-12-13 ENCOUNTER — Telehealth (INDEPENDENT_AMBULATORY_CARE_PROVIDER_SITE_OTHER): Payer: Medicare HMO | Admitting: Psychiatry

## 2020-12-13 ENCOUNTER — Encounter (HOSPITAL_COMMUNITY): Payer: Self-pay | Admitting: Psychiatry

## 2020-12-13 DIAGNOSIS — F411 Generalized anxiety disorder: Secondary | ICD-10-CM | POA: Diagnosis not present

## 2020-12-13 DIAGNOSIS — F3162 Bipolar disorder, current episode mixed, moderate: Secondary | ICD-10-CM | POA: Diagnosis not present

## 2020-12-13 DIAGNOSIS — F3132 Bipolar disorder, current episode depressed, moderate: Secondary | ICD-10-CM | POA: Diagnosis not present

## 2020-12-13 MED ORDER — AMITRIPTYLINE HCL 50 MG PO TABS: 50.0000 mg | ORAL_TABLET | Freq: Every day | ORAL | 2 refills | Status: DC

## 2020-12-13 MED ORDER — ALPRAZOLAM 1 MG PO TABS: 1.0000 mg | ORAL_TABLET | Freq: Four times a day (QID) | ORAL | 2 refills | Status: DC | PRN

## 2020-12-13 MED ORDER — DIVALPROEX SODIUM ER 250 MG PO TB24: ORAL_TABLET | ORAL | 2 refills | Status: DC

## 2020-12-13 NOTE — Progress Notes (Signed)
Virtual Visit via Video Note  I connected with Samantha Chambers on 12/13/20 at  3:40 PM EDT by a video enabled telemedicine application and verified that I am speaking with the correct person using two identifiers.  Location: Patient: home Provider: office   I discussed the limitations of evaluation and management by telemedicine and the availability of in person appointments. The patient expressed understanding and agreed to proceed.     I discussed the assessment and treatment plan with the patient. The patient was provided an opportunity to ask questions and all were answered. The patient agreed with the plan and demonstrated an understanding of the instructions.   The patient was advised to call back or seek an in-person evaluation if the symptoms worsen or if the condition fails to improve as anticipated.  I provided 15 minutes of non-face-to-face time during this encounter.   [name], MD  [facility] MD/PA/NP OP Progress Note  12/13/2020 4:23 PM Samantha Chambers  MRN:  [id number]  Chief Complaint:  Chief Complaint   Anxiety; Depression; Follow-up    HPI: This patient is a 50 year old divorced white female who lives with her 22 year old daughter in [place].  She is on disability.  The patient returns for follow-up after almost 3 months regarding her bipolar disorder and anxiety.  She states that she has been to the cardiologist because of tachycardia.  Apparently her heart rate even at rest is up to 1 15-1 20.  The chart states that she has an abnormal EKG but I cannot find the EKG.  She recently had an echocardiogram that to my reading seem to be normal.  Her cardiologist is concerned about the amitriptyline and the olanzapine which may be contributing to this problem.  I think the amitriptyline is more likely but she also states that she has been more "manic" sped up and has lost a fair amount of weight.  She is down to 122 pounds.  She denies being depressed but is somewhat  anxious.  I suggested that she get off the olanzapine and go back to Depakote which might help stabilize her mood a little bit better.  We will also cut down the amitriptyline. Visit Diagnosis:    ICD-10-CM   1. Bipolar 1 disorder, depressed, moderate ([place])  F31.32     2. Bipolar 1 disorder, mixed, moderate ([place])  F31.62     3. Generalized anxiety disorder  F41.1       Past Psychiatric History: Long-term outpatient treatment  Past Medical History:  Past Medical History:  Diagnosis Date   Angio-edema    Anxiety    Bipolar disorder ([place])    Depression    Headache(784.0)    Irritable bowel    Urticaria     Past Surgical History:  Procedure Laterality Date   FOOT SURGERY      Family Psychiatric History: see below  Family History:  Family History  Problem Relation Age of Onset   Bipolar disorder Mother    Depression Maternal Aunt    Depression Maternal Uncle    Urticaria Neg Hx    Immunodeficiency Neg Hx    Eczema Neg Hx    Atopy Neg Hx    Asthma Neg Hx    Angioedema Neg Hx    Allergic rhinitis Neg Hx     Social History:  Social History   Socioeconomic History   Marital status: Divorced    Spouse name: Not on file   Number of children: Not on file  Years of education: Not on file   Highest education level: Not on file  Occupational History   Not on file  Tobacco Use   Smoking status: Never   Smokeless tobacco: Never  Vaping Use   Vaping Use: Never used  Substance and Sexual Activity   Alcohol use: Yes    Comment: Rare glass of wine   Drug use: Yes    Types: Marijuana    Comment: smokes cannabis for anxiety    Sexual activity: Not Currently    Partners: Male    Birth control/protection: Pill  Other Topics Concern   Not on file  Social History Narrative   Not on file   Social Determinants of Health   Financial Resource Strain: Not on file  Food Insecurity: Not on file  Transportation Needs: Not on file  Physical Activity: Not on file   Stress: Not on file  Social Connections: Not on file    Allergies:  Allergies  Allergen Reactions   Clonazepam Nausea And Vomiting   Sulfa Antibiotics    Vilazodone     Per Pt med made her sick    Metabolic Disorder Labs: No results found for: HGBA1C, MPG No results found for: PROLACTIN No results found for: CHOL, TRIG, HDL, CHOLHDL, VLDL, LDLCALC No results found for: TSH  Therapeutic Level Labs: No results found for: LITHIUM Lab Results  Component Value Date   VALPROATE 33.8 (L) 03/13/2013   No components found for:  CBMZ  Current Medications: Current Outpatient Medications  Medication Sig Dispense Refill   amitriptyline (ELAVIL) 50 MG tablet Take 1 tablet (50 mg total) by mouth at bedtime. 30 tablet 2   divalproex (DEPAKOTE ER) 250 MG 24 hr tablet Take one at bedtime for one week, then increase to two at bedtime 60 tablet 2   ALPRAZolam (XANAX) 1 MG tablet Take 1 tablet (1 mg total) by mouth 4 (four) times daily as needed. 120 tablet 2   EPINEPHrine 0.3 mg/0.3 mL IJ SOAJ injection INJECT 0.3 MLS (0.3 MG TOTAL) INTO THE MUSCLE ONCE FOR 1 DOSE.     famotidine (PEPCID) 20 MG tablet Take 20 mg by mouth every evening.      pantoprazole (PROTONIX) 40 MG tablet TAKE 1 TABLET (40 MG TOTAL) BY MOUTH 2 (TWO) TIMES DAILY BEFORE A MEAL. 180 tablet 1   rosuvastatin (CRESTOR) 5 MG tablet Take 1 tablet by mouth daily.     No current facility-administered medications for this visit.     Musculoskeletal: Strength & Muscle Tone: within normal limits Gait & Station: normal Patient leans: N/A  Psychiatric Specialty Exam: Review of Systems  Cardiovascular:  Positive for palpitations.  Psychiatric/Behavioral:  The patient is nervous/anxious and is hyperactive.    There were no vitals taken for this visit.There is no height or weight on file to calculate BMI.  General Appearance: Casual and Fairly Groomed  Eye Contact:  Good  Speech:  Clear and Coherent  Volume:  Normal  Mood:   Anxious and Irritable  Affect:  Full Range  Thought Process:  Goal Directed  Orientation:  Full (Time, Place, and Person)  Thought Content: Rumination   Suicidal Thoughts:  No  Homicidal Thoughts:  No  Memory:  Immediate;   Good Recent;   Good Remote;   Good  Judgement:  Fair  Insight:  Fair  Psychomotor Activity:  Increased  Concentration:  Concentration: Fair and Attention Span: Fair  Recall:  Good  Fund of Knowledge: Good  Language: Good  Akathisia:  No  Handed:  Right  AIMS (if indicated): not done  Assets:  Communication Skills Desire for Improvement Resilience Social Support Talents/Skills  ADL's:  Intact  Cognition: WNL  Sleep:  Fair   Screenings: MDI    Flowsheet Row Office Visit from 11/22/2015 in [place] [facility]  Total Score (max 50) 38      PHQ2-9    Flowsheet Row Video Visit from 12/13/2020 in [place] Video Visit from 09/21/2020 in [place] Video Visit from 07/25/2020 in [place] [facility]  PHQ-2 Total Score 1 0 0      Flowsheet Row Video Visit from 12/13/2020 in [place] Video Visit from 09/21/2020 in [place] Video Visit from 07/25/2020 in [place] No Risk No Risk No Risk        Assessment and Plan:  This patient is a 50 year old female with a history of bipolar disorder ADHD and anxiety.  I am not sure what is going on with the tachycardia but we will stop the olanzapine in favor of Depakote ER 250 mg at bedtime for 1 week and then advance to 500 mg.  We will also cut the amitriptyline down to 50 mg at bedtime.  She can continue Xanax 1 mg up to 4 times daily.  She does seem a bit hypomanic today with accelerated's beach and she is quite  irritable and angry with her daughter.  She will return to see me in 4 weeks.  [name], MD 12/13/2020, 4:23 PM

## 2020-12-15 DIAGNOSIS — H5200 Hypermetropia, unspecified eye: Secondary | ICD-10-CM | POA: Diagnosis not present

## 2020-12-16 DIAGNOSIS — R002 Palpitations: Secondary | ICD-10-CM | POA: Diagnosis not present

## 2020-12-16 DIAGNOSIS — R000 Tachycardia, unspecified: Secondary | ICD-10-CM | POA: Diagnosis not present

## 2020-12-16 DIAGNOSIS — R9431 Abnormal electrocardiogram [ECG] [EKG]: Secondary | ICD-10-CM | POA: Diagnosis not present

## 2020-12-19 DIAGNOSIS — A6000 Herpesviral infection of urogenital system, unspecified: Secondary | ICD-10-CM | POA: Diagnosis not present

## 2021-01-04 DIAGNOSIS — M19019 Primary osteoarthritis, unspecified shoulder: Secondary | ICD-10-CM | POA: Diagnosis not present

## 2021-01-04 DIAGNOSIS — E7800 Pure hypercholesterolemia, unspecified: Secondary | ICD-10-CM | POA: Diagnosis not present

## 2021-01-04 DIAGNOSIS — F319 Bipolar disorder, unspecified: Secondary | ICD-10-CM | POA: Diagnosis not present

## 2021-01-04 DIAGNOSIS — Z299 Encounter for prophylactic measures, unspecified: Secondary | ICD-10-CM | POA: Diagnosis not present

## 2021-01-04 DIAGNOSIS — L309 Dermatitis, unspecified: Secondary | ICD-10-CM | POA: Diagnosis not present

## 2021-01-10 ENCOUNTER — Other Ambulatory Visit: Payer: Self-pay

## 2021-01-10 ENCOUNTER — Telehealth (INDEPENDENT_AMBULATORY_CARE_PROVIDER_SITE_OTHER): Payer: Medicare HMO | Admitting: Psychiatry

## 2021-01-10 ENCOUNTER — Encounter (HOSPITAL_COMMUNITY): Payer: Self-pay | Admitting: Psychiatry

## 2021-01-10 ENCOUNTER — Other Ambulatory Visit (HOSPITAL_COMMUNITY): Payer: Self-pay | Admitting: Psychiatry

## 2021-01-10 DIAGNOSIS — Z0100 Encounter for examination of eyes and vision without abnormal findings: Secondary | ICD-10-CM | POA: Diagnosis not present

## 2021-01-10 DIAGNOSIS — F900 Attention-deficit hyperactivity disorder, predominantly inattentive type: Secondary | ICD-10-CM

## 2021-01-10 DIAGNOSIS — F411 Generalized anxiety disorder: Secondary | ICD-10-CM

## 2021-01-10 DIAGNOSIS — F3132 Bipolar disorder, current episode depressed, moderate: Secondary | ICD-10-CM | POA: Diagnosis not present

## 2021-01-10 MED ORDER — AMITRIPTYLINE HCL 100 MG PO TABS: 100.0000 mg | ORAL_TABLET | Freq: Every day | ORAL | 2 refills | Status: DC

## 2021-01-10 MED ORDER — OLANZAPINE 2.5 MG PO TABS: 2.5000 mg | ORAL_TABLET | Freq: Every day | ORAL | 2 refills | Status: DC

## 2021-01-10 MED ORDER — ALPRAZOLAM 1 MG PO TABS: 1.0000 mg | ORAL_TABLET | Freq: Four times a day (QID) | ORAL | 2 refills | Status: DC | PRN

## 2021-01-10 NOTE — Progress Notes (Signed)
Virtual Visit via Video Note  I connected with Samantha Chambers on 01/10/21 at  3:40 PM EDT by a video enabled telemedicine application and verified that I am speaking with the correct person using two identifiers.  Location: Patient: home  Provider: office   I discussed the limitations of evaluation and management by telemedicine and the availability of in person appointments. The patient expressed understanding and agreed to proceed.      I discussed the assessment and treatment plan with the patient. The patient was provided an opportunity to ask questions and all were answered. The patient agreed with the plan and demonstrated an understanding of the instructions.   The patient was advised to call back or seek an in-person evaluation if the symptoms worsen or if the condition fails to improve as anticipated.  I provided 20 minutes of non-face-to-face time during this encounter.   [name], MD  [facility] MD/PA/NP OP Progress Note  01/10/2021 4:05 PM Samantha Chambers  MRN:  [id number]  Chief Complaint:  Chief Complaint   Anxiety; Depression; Manic Behavior; Follow-up    HPI: This patient is a 50 year old divorced female who lives with her 33 year old daughter in [place].  She is on disability.  The patient returns after 4 weeks regarding her bipolar disorder anxiety.  She complained of tachycardia last time and her cardiologist was having her wear a Holter monitor.  She states that while wearing the Holter monitor she went off the amitriptyline and olanzapine but her heart rate still remained around 120.  When she went off these medications she "crashed" and got somewhat depressed but she was also breaking up with someone.  She is now back on them.  She never did start the Depakote.  She denies being depressed right now but still is anxious regarding her daughter's disobedient behavior.  She states that the Xanax continues to help her anxiety.  She has cut down the amitriptyline to 100  mg at bedtime and it seems to still be helping with her sleep and mood. Visit Diagnosis:    ICD-10-CM   1. Bipolar 1 disorder, depressed, moderate ([place])  F31.32     2. Generalized anxiety disorder  F41.1     3. Attention deficit hyperactivity disorder (ADHD), predominantly inattentive type  F90.0       Past Psychiatric History: Long-term outpatient treatment  Past Medical History:  Past Medical History:  Diagnosis Date   Angio-edema    Anxiety    Bipolar disorder ([place])    Depression    Headache(784.0)    Irritable bowel    Urticaria     Past Surgical History:  Procedure Laterality Date   FOOT SURGERY      Family Psychiatric History: see below  Family History:  Family History  Problem Relation Age of Onset   Bipolar disorder Mother    Depression Maternal Aunt    Depression Maternal Uncle    Urticaria Neg Hx    Immunodeficiency Neg Hx    Eczema Neg Hx    Atopy Neg Hx    Asthma Neg Hx    Angioedema Neg Hx    Allergic rhinitis Neg Hx     Social History:  Social History   Socioeconomic History   Marital status: Divorced    Spouse name: Not on file   Number of children: Not on file   Years of education: Not on file   Highest education level: Not on file  Occupational History   Not on file  Tobacco Use   Smoking status: Never   Smokeless tobacco: Never  Vaping Use   Vaping Use: Never used  Substance and Sexual Activity   Alcohol use: Yes    Comment: Rare glass of wine   Drug use: Yes    Types: Marijuana    Comment: smokes cannabis for anxiety    Sexual activity: Not Currently    Partners: Male    Birth control/protection: Pill  Other Topics Concern   Not on file  Social History Narrative   Not on file   Social Determinants of Health   Financial Resource Strain: Not on file  Food Insecurity: Not on file  Transportation Needs: Not on file  Physical Activity: Not on file  Stress: Not on file  Social Connections: Not on file    Allergies:   Allergies  Allergen Reactions   Clonazepam Nausea And Vomiting   Sulfa Antibiotics    Vilazodone     Per Pt med made her sick    Metabolic Disorder Labs: No results found for: HGBA1C, MPG No results found for: PROLACTIN No results found for: CHOL, TRIG, HDL, CHOLHDL, VLDL, LDLCALC No results found for: TSH  Therapeutic Level Labs: No results found for: LITHIUM Lab Results  Component Value Date   VALPROATE 33.8 (L) 03/13/2013   No components found for:  CBMZ  Current Medications: Current Outpatient Medications  Medication Sig Dispense Refill   ALPRAZolam (XANAX) 1 MG tablet Take 1 tablet (1 mg total) by mouth 4 (four) times daily as needed. 120 tablet 2   amitriptyline (ELAVIL) 100 MG tablet Take 1 tablet (100 mg total) by mouth at bedtime. 90 tablet 2   EPINEPHrine 0.3 mg/0.3 mL IJ SOAJ injection INJECT 0.3 MLS (0.3 MG TOTAL) INTO THE MUSCLE ONCE FOR 1 DOSE.     famotidine (PEPCID) 20 MG tablet Take 20 mg by mouth every evening.      OLANZapine (ZYPREXA) 2.5 MG tablet Take 1 tablet (2.5 mg total) by mouth at bedtime. 90 tablet 2   pantoprazole (PROTONIX) 40 MG tablet TAKE 1 TABLET (40 MG TOTAL) BY MOUTH 2 (TWO) TIMES DAILY BEFORE A MEAL. 180 tablet 1   rosuvastatin (CRESTOR) 5 MG tablet Take 1 tablet by mouth daily.     No current facility-administered medications for this visit.     Musculoskeletal: Strength & Muscle Tone: within normal limits Gait & Station: normal Patient leans: N/A  Psychiatric Specialty Exam: Review of Systems  Cardiovascular:  Positive for palpitations.  Psychiatric/Behavioral:  The patient is nervous/anxious.   All other systems reviewed and are negative.  There were no vitals taken for this visit.There is no height or weight on file to calculate BMI.  General Appearance: Casual and Fairly Groomed  Eye Contact:  Good  Speech:  Clear and Coherent  Volume:  Normal  Mood:  Anxious and Euthymic  Affect:  Appropriate and Congruent  Thought  Process:  Goal Directed  Orientation:  Full (Time, Place, and Person)  Thought Content: Rumination   Suicidal Thoughts:  No  Homicidal Thoughts:  No  Memory:  Immediate;   Good Recent;   Good Remote;   Good  Judgement:  Good  Insight:  Fair  Psychomotor Activity:  Normal  Concentration:  Concentration: Fair and Attention Span: Fair  Recall:  Good  Fund of Knowledge: Good  Language: Good  Akathisia:  No  Handed:  Right  AIMS (if indicated): not done  Assets:  Communication Skills Desire for Improvement Resilience Social  Support Talents/Skills  ADL's:  Intact  Cognition: WNL  Sleep:  Fair   Screenings: MDI    Flowsheet Row Office Visit from 11/22/2015 in [facility] [facility]  Total Score (max 50) 38      PHQ2-9    Flowsheet Row Video Visit from 01/10/2021 in [place] Video Visit from 12/13/2020 in [place] Video Visit from 09/21/2020 in [place] Video Visit from 07/25/2020 in [place] [facility]  PHQ-2 Total Score 0 1 0 0      Flowsheet Row Video Visit from 01/10/2021 in [place] Video Visit from 12/13/2020 in [place] [facility] Video Visit from 09/21/2020 in [place] No Risk No Risk No Risk        Assessment and Plan: This patient is a 50 year old female with a history of bipolar disorder ADHD and anxiety.  She still awaiting the results of her Holter monitor.  In the meantime she has gone back to the amitriptyline 100 mg at bedtime for sleep and depression as well as olanzapine 2.5 mg at bedtime for mood stabilization.  She is also continuing Xanax 1 mg 4 times daily for agitation and anxiety.  She states that going on and off of  these medications has not affected her heart rate.  She denies symptoms of mania or depression.  She will return to see me in 2 months [name], MD 01/10/2021, 4:05 PM

## 2021-01-16 DIAGNOSIS — M25612 Stiffness of left shoulder, not elsewhere classified: Secondary | ICD-10-CM | POA: Diagnosis not present

## 2021-01-16 DIAGNOSIS — M25511 Pain in right shoulder: Secondary | ICD-10-CM | POA: Diagnosis not present

## 2021-01-16 DIAGNOSIS — R29898 Other symptoms and signs involving the musculoskeletal system: Secondary | ICD-10-CM | POA: Diagnosis not present

## 2021-01-17 DIAGNOSIS — R29898 Other symptoms and signs involving the musculoskeletal system: Secondary | ICD-10-CM | POA: Diagnosis not present

## 2021-01-17 DIAGNOSIS — M25612 Stiffness of left shoulder, not elsewhere classified: Secondary | ICD-10-CM | POA: Diagnosis not present

## 2021-01-17 DIAGNOSIS — M25511 Pain in right shoulder: Secondary | ICD-10-CM | POA: Diagnosis not present

## 2021-01-24 DIAGNOSIS — B009 Herpesviral infection, unspecified: Secondary | ICD-10-CM | POA: Diagnosis not present

## 2021-01-25 DIAGNOSIS — M25612 Stiffness of left shoulder, not elsewhere classified: Secondary | ICD-10-CM | POA: Diagnosis not present

## 2021-01-25 DIAGNOSIS — R29898 Other symptoms and signs involving the musculoskeletal system: Secondary | ICD-10-CM | POA: Diagnosis not present

## 2021-01-25 DIAGNOSIS — M25511 Pain in right shoulder: Secondary | ICD-10-CM | POA: Diagnosis not present

## 2021-01-27 DIAGNOSIS — M25511 Pain in right shoulder: Secondary | ICD-10-CM | POA: Diagnosis not present

## 2021-01-27 DIAGNOSIS — R29898 Other symptoms and signs involving the musculoskeletal system: Secondary | ICD-10-CM | POA: Diagnosis not present

## 2021-01-27 DIAGNOSIS — M25612 Stiffness of left shoulder, not elsewhere classified: Secondary | ICD-10-CM | POA: Diagnosis not present

## 2021-01-27 DIAGNOSIS — R002 Palpitations: Secondary | ICD-10-CM | POA: Diagnosis not present

## 2021-01-30 DIAGNOSIS — R29898 Other symptoms and signs involving the musculoskeletal system: Secondary | ICD-10-CM | POA: Diagnosis not present

## 2021-01-30 DIAGNOSIS — M25612 Stiffness of left shoulder, not elsewhere classified: Secondary | ICD-10-CM | POA: Diagnosis not present

## 2021-01-30 DIAGNOSIS — M25511 Pain in right shoulder: Secondary | ICD-10-CM | POA: Diagnosis not present

## 2021-02-02 DIAGNOSIS — E782 Mixed hyperlipidemia: Secondary | ICD-10-CM | POA: Diagnosis not present

## 2021-02-02 DIAGNOSIS — R29898 Other symptoms and signs involving the musculoskeletal system: Secondary | ICD-10-CM | POA: Diagnosis not present

## 2021-02-02 DIAGNOSIS — R9431 Abnormal electrocardiogram [ECG] [EKG]: Secondary | ICD-10-CM | POA: Diagnosis not present

## 2021-02-02 DIAGNOSIS — R002 Palpitations: Secondary | ICD-10-CM | POA: Diagnosis not present

## 2021-02-02 DIAGNOSIS — M25511 Pain in right shoulder: Secondary | ICD-10-CM | POA: Diagnosis not present

## 2021-02-02 DIAGNOSIS — Z8249 Family history of ischemic heart disease and other diseases of the circulatory system: Secondary | ICD-10-CM | POA: Diagnosis not present

## 2021-02-02 DIAGNOSIS — R000 Tachycardia, unspecified: Secondary | ICD-10-CM | POA: Diagnosis not present

## 2021-02-02 DIAGNOSIS — M25612 Stiffness of left shoulder, not elsewhere classified: Secondary | ICD-10-CM | POA: Diagnosis not present

## 2021-02-08 DIAGNOSIS — M25511 Pain in right shoulder: Secondary | ICD-10-CM | POA: Diagnosis not present

## 2021-02-08 DIAGNOSIS — R29898 Other symptoms and signs involving the musculoskeletal system: Secondary | ICD-10-CM | POA: Diagnosis not present

## 2021-02-08 DIAGNOSIS — M25612 Stiffness of left shoulder, not elsewhere classified: Secondary | ICD-10-CM | POA: Diagnosis not present

## 2021-02-10 DIAGNOSIS — R29898 Other symptoms and signs involving the musculoskeletal system: Secondary | ICD-10-CM | POA: Diagnosis not present

## 2021-02-10 DIAGNOSIS — M25612 Stiffness of left shoulder, not elsewhere classified: Secondary | ICD-10-CM | POA: Diagnosis not present

## 2021-02-10 DIAGNOSIS — M25511 Pain in right shoulder: Secondary | ICD-10-CM | POA: Diagnosis not present

## 2021-02-16 DIAGNOSIS — M25511 Pain in right shoulder: Secondary | ICD-10-CM | POA: Diagnosis not present

## 2021-02-21 DIAGNOSIS — E7800 Pure hypercholesterolemia, unspecified: Secondary | ICD-10-CM | POA: Diagnosis not present

## 2021-02-21 DIAGNOSIS — M25511 Pain in right shoulder: Secondary | ICD-10-CM | POA: Diagnosis not present

## 2021-02-21 DIAGNOSIS — Z6821 Body mass index (BMI) 21.0-21.9, adult: Secondary | ICD-10-CM | POA: Diagnosis not present

## 2021-02-21 DIAGNOSIS — Z23 Encounter for immunization: Secondary | ICD-10-CM | POA: Diagnosis not present

## 2021-02-21 DIAGNOSIS — Z299 Encounter for prophylactic measures, unspecified: Secondary | ICD-10-CM | POA: Diagnosis not present

## 2021-02-21 DIAGNOSIS — R000 Tachycardia, unspecified: Secondary | ICD-10-CM | POA: Diagnosis not present

## 2021-02-21 DIAGNOSIS — F319 Bipolar disorder, unspecified: Secondary | ICD-10-CM | POA: Diagnosis not present

## 2021-02-22 ENCOUNTER — Telehealth (HOSPITAL_COMMUNITY): Payer: Self-pay | Admitting: *Deleted

## 2021-02-22 ENCOUNTER — Telehealth (HOSPITAL_COMMUNITY): Payer: Self-pay | Admitting: Psychiatry

## 2021-02-22 ENCOUNTER — Other Ambulatory Visit (HOSPITAL_COMMUNITY): Payer: Self-pay | Admitting: Psychiatry

## 2021-02-22 MED ORDER — SERTRALINE HCL 25 MG PO TABS: 25.0000 mg | ORAL_TABLET | Freq: Every day | ORAL | 2 refills | Status: DC

## 2021-02-22 NOTE — Telephone Encounter (Signed)
Per patient, she would like for provider to please call her.    Per pt she was having fast heart rate and was put on a holster monitor for her heart.    Patient states she have been adjusting and stopping and restarting meds due to the monitoring and really really fast heart rate.    Patient states that she have another meeting with her provider to discuss going back on the monitor.    Per pt her doctor feels like it could be one of the meds that Dr. Harrington Challenger may have her on.    Patient would like for provider to please call her back about her medications.    Patient number is [phone]

## 2021-02-22 NOTE — Telephone Encounter (Signed)
Can she do this as an appt? I am open until 3:40

## 2021-02-22 NOTE — Telephone Encounter (Signed)
Pre patient, she would like for provider to please call her.   Per pt she was having fast heart rate and was put on a holster monitor for her heart.   Patient states she have been adjusting and stopping and restarting meds due to the monitoring and really really fast heart rate.   Patient states that she have another meeting with her provider to discuss going back on the monitor.   Per pt her doctor feels like it could be one of the meds that Dr. Harrington Challenger may have her on.   Patient would like for provider to please call her back about her medications.   Patient number is [phone].

## 2021-02-22 NOTE — Telephone Encounter (Signed)
Pt having episodes of tachycardia at rest up to 150 bpm. Recent Holter was ok but she wiould like to taper off amitryptiline and start Zoloft 25

## 2021-02-23 DIAGNOSIS — M25511 Pain in right shoulder: Secondary | ICD-10-CM | POA: Diagnosis not present

## 2021-02-24 DIAGNOSIS — Z01419 Encounter for gynecological examination (general) (routine) without abnormal findings: Secondary | ICD-10-CM | POA: Diagnosis not present

## 2021-02-24 DIAGNOSIS — Z1151 Encounter for screening for human papillomavirus (HPV): Secondary | ICD-10-CM | POA: Diagnosis not present

## 2021-02-24 DIAGNOSIS — A6004 Herpesviral vulvovaginitis: Secondary | ICD-10-CM | POA: Diagnosis not present

## 2021-02-24 DIAGNOSIS — Z6821 Body mass index (BMI) 21.0-21.9, adult: Secondary | ICD-10-CM | POA: Diagnosis not present

## 2021-02-24 DIAGNOSIS — R002 Palpitations: Secondary | ICD-10-CM | POA: Diagnosis not present

## 2021-03-01 DIAGNOSIS — M25511 Pain in right shoulder: Secondary | ICD-10-CM | POA: Diagnosis not present

## 2021-03-03 DIAGNOSIS — M25511 Pain in right shoulder: Secondary | ICD-10-CM | POA: Diagnosis not present

## 2021-03-06 DIAGNOSIS — R000 Tachycardia, unspecified: Secondary | ICD-10-CM | POA: Diagnosis not present

## 2021-03-06 DIAGNOSIS — M25511 Pain in right shoulder: Secondary | ICD-10-CM | POA: Diagnosis not present

## 2021-03-06 DIAGNOSIS — F319 Bipolar disorder, unspecified: Secondary | ICD-10-CM | POA: Diagnosis not present

## 2021-03-06 DIAGNOSIS — M25512 Pain in left shoulder: Secondary | ICD-10-CM | POA: Diagnosis not present

## 2021-03-06 DIAGNOSIS — Z299 Encounter for prophylactic measures, unspecified: Secondary | ICD-10-CM | POA: Diagnosis not present

## 2021-03-06 NOTE — Telephone Encounter (Signed)
Spoke with patient and she stated provider sent in Zoloft for her per message. Per pt she have not started the medication yet and appt was rescheduled for another day.

## 2021-03-07 ENCOUNTER — Telehealth (HOSPITAL_COMMUNITY): Payer: Medicare HMO | Admitting: Psychiatry

## 2021-03-07 DIAGNOSIS — M25511 Pain in right shoulder: Secondary | ICD-10-CM | POA: Diagnosis not present

## 2021-03-09 DIAGNOSIS — M25511 Pain in right shoulder: Secondary | ICD-10-CM | POA: Diagnosis not present

## 2021-03-13 DIAGNOSIS — M25511 Pain in right shoulder: Secondary | ICD-10-CM | POA: Diagnosis not present

## 2021-03-16 DIAGNOSIS — R002 Palpitations: Secondary | ICD-10-CM | POA: Diagnosis not present

## 2021-03-17 DIAGNOSIS — R29898 Other symptoms and signs involving the musculoskeletal system: Secondary | ICD-10-CM | POA: Diagnosis not present

## 2021-03-17 DIAGNOSIS — M25511 Pain in right shoulder: Secondary | ICD-10-CM | POA: Diagnosis not present

## 2021-03-17 DIAGNOSIS — M25612 Stiffness of left shoulder, not elsewhere classified: Secondary | ICD-10-CM | POA: Diagnosis not present

## 2021-03-20 ENCOUNTER — Other Ambulatory Visit: Payer: Self-pay

## 2021-03-20 ENCOUNTER — Encounter (HOSPITAL_COMMUNITY): Payer: Self-pay | Admitting: Psychiatry

## 2021-03-20 ENCOUNTER — Telehealth (INDEPENDENT_AMBULATORY_CARE_PROVIDER_SITE_OTHER): Payer: Medicare HMO | Admitting: Psychiatry

## 2021-03-20 DIAGNOSIS — F900 Attention-deficit hyperactivity disorder, predominantly inattentive type: Secondary | ICD-10-CM | POA: Diagnosis not present

## 2021-03-20 DIAGNOSIS — F3132 Bipolar disorder, current episode depressed, moderate: Secondary | ICD-10-CM | POA: Diagnosis not present

## 2021-03-20 DIAGNOSIS — F411 Generalized anxiety disorder: Secondary | ICD-10-CM | POA: Diagnosis not present

## 2021-03-20 MED ORDER — TRAZODONE HCL 50 MG PO TABS: 50.0000 mg | ORAL_TABLET | Freq: Every day | ORAL | 2 refills | Status: DC

## 2021-03-20 MED ORDER — ALPRAZOLAM 1 MG PO TABS: 1.0000 mg | ORAL_TABLET | Freq: Four times a day (QID) | ORAL | 2 refills | Status: DC | PRN

## 2021-03-20 MED ORDER — OLANZAPINE 2.5 MG PO TABS: 2.5000 mg | ORAL_TABLET | Freq: Every day | ORAL | 2 refills | Status: DC

## 2021-03-20 NOTE — Progress Notes (Signed)
Virtual Visit via Telephone Note  I connected with Samantha Chambers on 03/20/21 at  3:40 PM EST by telephone and verified that I am speaking with the correct person using two identifiers.  Location: Patient: home Provider: home office   I discussed the limitations, risks, security and privacy concerns of performing an evaluation and management service by telephone and the availability of in person appointments. I also discussed with the patient that there may be a patient responsible charge related to this service. The patient expressed understanding and agreed to proceed.      I discussed the assessment and treatment plan with the patient. The patient was provided an opportunity to ask questions and all were answered. The patient agreed with the plan and demonstrated an understanding of the instructions.   The patient was advised to call back or seek an in-person evaluation if the symptoms worsen or if the condition fails to improve as anticipated.  I provided 15 minutes of non-face-to-face time during this encounter.   [name], MD  [facility] MD/PA/NP OP Progress Note  03/20/2021 4:01 PM Samantha Chambers  MRN:  [id number]  Chief Complaint:  Chief Complaint   Anxiety; Depression; Follow-up    HPI: This patient is a 50 year old divorced female who lives with her 90 year old daughter in [place].  She is on disability.  The patient returns for follow-up after 2 months.  She states that she is still dealing with the tachycardia symptoms.  She has had a second Holter monitor but does not have the result in.  We tried adding Zoloft to her regimen but it gave her headaches and she has stopped it.  She denies any current depression.  The Xanax is helping her anxiety and the low olanzapine seems to help stabilize her mood.  She is not sleeping well since I suggested trazodone and she will give it a try.  She is no longer taking any amitriptyline just in case that it may have been contributing to  the heart problems.  She is scheduled to follow-up with cardiology.  She has also lost quite a bit of weight and given this with the increased heart rate I was suspicious of thyroid problems that she states her recent thyroid tests were normal. Visit Diagnosis:    ICD-10-CM   1. Bipolar 1 disorder, depressed, moderate ([place])  F31.32     2. Generalized anxiety disorder  F41.1     3. Attention deficit hyperactivity disorder (ADHD), predominantly inattentive type  F90.0       Past Psychiatric History: Long-term outpatient treatment  Past Medical History:  Past Medical History:  Diagnosis Date   Angio-edema    Anxiety    Bipolar disorder ([place])    Depression    Headache(784.0)    Irritable bowel    Urticaria     Past Surgical History:  Procedure Laterality Date   FOOT SURGERY      Family Psychiatric History: see below  Family History:  Family History  Problem Relation Age of Onset   Bipolar disorder Mother    Depression Maternal Aunt    Depression Maternal Uncle    Urticaria Neg Hx    Immunodeficiency Neg Hx    Eczema Neg Hx    Atopy Neg Hx    Asthma Neg Hx    Angioedema Neg Hx    Allergic rhinitis Neg Hx     Social History:  Social History   Socioeconomic History   Marital status: Divorced  Spouse name: Not on file   Number of children: Not on file   Years of education: Not on file   Highest education level: Not on file  Occupational History   Not on file  Tobacco Use   Smoking status: Never   Smokeless tobacco: Never  Vaping Use   Vaping Use: Never used  Substance and Sexual Activity   Alcohol use: Yes    Comment: Rare glass of wine   Drug use: Yes    Types: Marijuana    Comment: smokes cannabis for anxiety    Sexual activity: Not Currently    Partners: Male    Birth control/protection: Pill  Other Topics Concern   Not on file  Social History Narrative   Not on file   Social Determinants of Health   Financial Resource Strain: Not on file   Food Insecurity: Not on file  Transportation Needs: Not on file  Physical Activity: Not on file  Stress: Not on file  Social Connections: Not on file    Allergies:  Allergies  Allergen Reactions   Clonazepam Nausea And Vomiting   Sulfa Antibiotics    Vilazodone     Per Pt med made her sick    Metabolic Disorder Labs: No results found for: HGBA1C, MPG No results found for: PROLACTIN No results found for: CHOL, TRIG, HDL, CHOLHDL, VLDL, LDLCALC No results found for: TSH  Therapeutic Level Labs: No results found for: LITHIUM Lab Results  Component Value Date   VALPROATE 33.8 (L) 03/13/2013   No components found for:  CBMZ  Current Medications: Current Outpatient Medications  Medication Sig Dispense Refill   traZODone (DESYREL) 50 MG tablet Take 1 tablet (50 mg total) by mouth at bedtime. 30 tablet 2   ALPRAZolam (XANAX) 1 MG tablet Take 1 tablet (1 mg total) by mouth 4 (four) times daily as needed. 120 tablet 2   EPINEPHrine 0.3 mg/0.3 mL IJ SOAJ injection INJECT 0.3 MLS (0.3 MG TOTAL) INTO THE MUSCLE ONCE FOR 1 DOSE.     famotidine (PEPCID) 20 MG tablet Take 20 mg by mouth every evening.      metoprolol succinate (TOPROL-XL) 25 MG 24 hr tablet      OLANZapine (ZYPREXA) 2.5 MG tablet Take 1 tablet (2.5 mg total) by mouth at bedtime. 90 tablet 2   pantoprazole (PROTONIX) 40 MG tablet TAKE 1 TABLET (40 MG TOTAL) BY MOUTH 2 (TWO) TIMES DAILY BEFORE A MEAL. 180 tablet 1   rosuvastatin (CRESTOR) 5 MG tablet Take 1 tablet by mouth daily.     No current facility-administered medications for this visit.     Musculoskeletal: Strength & Muscle Tone: na Gait & Station: na Patient leans: N/A  Psychiatric Specialty Exam: Review of Systems  Cardiovascular:  Positive for palpitations.  Psychiatric/Behavioral:  Positive for sleep disturbance.   All other systems reviewed and are negative.  There were no vitals taken for this visit.There is no height or weight on file to  calculate BMI.  General Appearance: NA  Eye Contact:  NA  Speech:  Clear and Coherent  Volume:  Normal  Mood:  Euthymic  Affect:  NA  Thought Process:  Goal Directed  Orientation:  Full (Time, Place, and Person)  Thought Content: WDL   Suicidal Thoughts:  No  Homicidal Thoughts:  No  Memory:  Immediate;   Good Recent;   Good Remote;   Good  Judgement:  Good  Insight:  Good  Psychomotor Activity:  Normal  Concentration:  Concentration: Good and Attention Span: Good  Recall:  Good  Fund of Knowledge: Good  Language: Good  Akathisia:  No  Handed:  Right  AIMS (if indicated): not done  Assets:  Communication Skills Desire for Improvement Resilience Social Support Talents/Skills  ADL's:  Intact  Cognition: WNL  Sleep:  Poor   Screenings: MDI    Flowsheet Row Office Visit from 11/22/2015 in [place] [facility]  Total Score (max 50) 38      PHQ2-9    Flowsheet Row Video Visit from 03/20/2021 in [place] Video Visit from 01/10/2021 in [place] Video Visit from 12/13/2020 in [place] Video Visit from 09/21/2020 in [place] Video Visit from 07/25/2020 in [place] [facility]  PHQ-2 Total Score 0 0 1 0 0      Flowsheet Row Video Visit from 01/10/2021 in [place] Video Visit from 12/13/2020 in [place] [facility] Video Visit from 09/21/2020 in [place] No Risk No Risk No Risk        Assessment and Plan: This patient is a 50 year old female with a history of bipolar disorder ADHD and anxiety.  She is still trying to get some answers to cardiology but not much is forthcoming at this point.   She for now we will continue olanzapine 2.5 mg at bedtime for mood stabilization, Xanax 1 mg 4 times daily for agitation and anxiety.  We will add trazodone 50 mg at bedtime for sleep.  She will return to see me in 2 months   [name], MD 03/20/2021, 4:01 PM

## 2021-03-21 DIAGNOSIS — M25511 Pain in right shoulder: Secondary | ICD-10-CM | POA: Diagnosis not present

## 2021-03-21 DIAGNOSIS — R29898 Other symptoms and signs involving the musculoskeletal system: Secondary | ICD-10-CM | POA: Diagnosis not present

## 2021-03-21 DIAGNOSIS — M25612 Stiffness of left shoulder, not elsewhere classified: Secondary | ICD-10-CM | POA: Diagnosis not present

## 2021-03-23 DIAGNOSIS — M25612 Stiffness of left shoulder, not elsewhere classified: Secondary | ICD-10-CM | POA: Diagnosis not present

## 2021-03-23 DIAGNOSIS — R29898 Other symptoms and signs involving the musculoskeletal system: Secondary | ICD-10-CM | POA: Diagnosis not present

## 2021-03-23 DIAGNOSIS — M25511 Pain in right shoulder: Secondary | ICD-10-CM | POA: Diagnosis not present

## 2021-03-24 DIAGNOSIS — I471 Supraventricular tachycardia: Secondary | ICD-10-CM | POA: Diagnosis not present

## 2021-03-24 DIAGNOSIS — F319 Bipolar disorder, unspecified: Secondary | ICD-10-CM | POA: Diagnosis not present

## 2021-03-24 DIAGNOSIS — M542 Cervicalgia: Secondary | ICD-10-CM | POA: Diagnosis not present

## 2021-03-24 DIAGNOSIS — R000 Tachycardia, unspecified: Secondary | ICD-10-CM | POA: Diagnosis not present

## 2021-03-24 DIAGNOSIS — Z299 Encounter for prophylactic measures, unspecified: Secondary | ICD-10-CM | POA: Diagnosis not present

## 2021-03-28 DIAGNOSIS — R29898 Other symptoms and signs involving the musculoskeletal system: Secondary | ICD-10-CM | POA: Diagnosis not present

## 2021-03-28 DIAGNOSIS — M25612 Stiffness of left shoulder, not elsewhere classified: Secondary | ICD-10-CM | POA: Diagnosis not present

## 2021-03-28 DIAGNOSIS — M25511 Pain in right shoulder: Secondary | ICD-10-CM | POA: Diagnosis not present

## 2021-03-29 ENCOUNTER — Telehealth (HOSPITAL_COMMUNITY): Payer: Self-pay | Admitting: *Deleted

## 2021-03-29 NOTE — Telephone Encounter (Signed)
Pt called and stated the Trazodone is not working. Per pt she bumped it up to 100 mg and still waking up about 3 hours after she goes to sleep and still waking up several time during the night.  She don't know if provider wanted to try something  Per pt she still have Belsomra and she tried that even before Trazodone and it does not work at all, not at all.  Per pt she would like for provider advise.   [phone]

## 2021-04-03 ENCOUNTER — Other Ambulatory Visit (HOSPITAL_COMMUNITY): Payer: Self-pay | Admitting: Psychiatry

## 2021-04-03 MED ORDER — TEMAZEPAM 15 MG PO CAPS: 15.0000 mg | ORAL_CAPSULE | Freq: Every evening | ORAL | 2 refills | Status: DC | PRN

## 2021-04-03 NOTE — Telephone Encounter (Signed)
Spoke with patient and informed her with what provider stated and she verbalized understanding.

## 2021-04-03 NOTE — Telephone Encounter (Signed)
Tell her restoril sent in

## 2021-04-04 DIAGNOSIS — M25612 Stiffness of left shoulder, not elsewhere classified: Secondary | ICD-10-CM | POA: Diagnosis not present

## 2021-04-04 DIAGNOSIS — M25511 Pain in right shoulder: Secondary | ICD-10-CM | POA: Diagnosis not present

## 2021-04-04 DIAGNOSIS — R29898 Other symptoms and signs involving the musculoskeletal system: Secondary | ICD-10-CM | POA: Diagnosis not present

## 2021-04-07 DIAGNOSIS — M25612 Stiffness of left shoulder, not elsewhere classified: Secondary | ICD-10-CM | POA: Diagnosis not present

## 2021-04-07 DIAGNOSIS — R29898 Other symptoms and signs involving the musculoskeletal system: Secondary | ICD-10-CM | POA: Diagnosis not present

## 2021-04-07 DIAGNOSIS — M25511 Pain in right shoulder: Secondary | ICD-10-CM | POA: Diagnosis not present

## 2021-04-10 DIAGNOSIS — Z1231 Encounter for screening mammogram for malignant neoplasm of breast: Secondary | ICD-10-CM | POA: Diagnosis not present

## 2021-04-11 DIAGNOSIS — R000 Tachycardia, unspecified: Secondary | ICD-10-CM | POA: Diagnosis not present

## 2021-04-11 DIAGNOSIS — Z8249 Family history of ischemic heart disease and other diseases of the circulatory system: Secondary | ICD-10-CM | POA: Diagnosis not present

## 2021-04-11 DIAGNOSIS — E782 Mixed hyperlipidemia: Secondary | ICD-10-CM | POA: Diagnosis not present

## 2021-04-13 DIAGNOSIS — R29898 Other symptoms and signs involving the musculoskeletal system: Secondary | ICD-10-CM | POA: Diagnosis not present

## 2021-04-13 DIAGNOSIS — M25612 Stiffness of left shoulder, not elsewhere classified: Secondary | ICD-10-CM | POA: Diagnosis not present

## 2021-04-13 DIAGNOSIS — M25511 Pain in right shoulder: Secondary | ICD-10-CM | POA: Diagnosis not present

## 2021-04-18 DIAGNOSIS — R29898 Other symptoms and signs involving the musculoskeletal system: Secondary | ICD-10-CM | POA: Diagnosis not present

## 2021-04-18 DIAGNOSIS — M25511 Pain in right shoulder: Secondary | ICD-10-CM | POA: Diagnosis not present

## 2021-04-18 DIAGNOSIS — M25612 Stiffness of left shoulder, not elsewhere classified: Secondary | ICD-10-CM | POA: Diagnosis not present

## 2021-04-21 DIAGNOSIS — M25511 Pain in right shoulder: Secondary | ICD-10-CM | POA: Diagnosis not present

## 2021-04-21 DIAGNOSIS — M25612 Stiffness of left shoulder, not elsewhere classified: Secondary | ICD-10-CM | POA: Diagnosis not present

## 2021-04-21 DIAGNOSIS — R29898 Other symptoms and signs involving the musculoskeletal system: Secondary | ICD-10-CM | POA: Diagnosis not present

## 2021-04-25 DIAGNOSIS — R29898 Other symptoms and signs involving the musculoskeletal system: Secondary | ICD-10-CM | POA: Diagnosis not present

## 2021-04-25 DIAGNOSIS — M25612 Stiffness of left shoulder, not elsewhere classified: Secondary | ICD-10-CM | POA: Diagnosis not present

## 2021-04-25 DIAGNOSIS — M25511 Pain in right shoulder: Secondary | ICD-10-CM | POA: Diagnosis not present

## 2021-04-28 DIAGNOSIS — M25511 Pain in right shoulder: Secondary | ICD-10-CM | POA: Diagnosis not present

## 2021-04-28 DIAGNOSIS — R29898 Other symptoms and signs involving the musculoskeletal system: Secondary | ICD-10-CM | POA: Diagnosis not present

## 2021-04-28 DIAGNOSIS — M25612 Stiffness of left shoulder, not elsewhere classified: Secondary | ICD-10-CM | POA: Diagnosis not present

## 2021-05-10 DIAGNOSIS — M25612 Stiffness of left shoulder, not elsewhere classified: Secondary | ICD-10-CM | POA: Diagnosis not present

## 2021-05-10 DIAGNOSIS — R29898 Other symptoms and signs involving the musculoskeletal system: Secondary | ICD-10-CM | POA: Diagnosis not present

## 2021-05-10 DIAGNOSIS — M25511 Pain in right shoulder: Secondary | ICD-10-CM | POA: Diagnosis not present

## 2021-05-14 ENCOUNTER — Other Ambulatory Visit: Payer: Self-pay | Admitting: Nurse Practitioner

## 2021-05-15 ENCOUNTER — Encounter: Payer: Self-pay | Admitting: Internal Medicine

## 2021-05-15 NOTE — Telephone Encounter (Signed)
I have refilled but needs office visit.

## 2021-05-18 ENCOUNTER — Other Ambulatory Visit: Payer: Self-pay

## 2021-05-18 ENCOUNTER — Telehealth (HOSPITAL_COMMUNITY): Payer: Medicare HMO | Admitting: Psychiatry

## 2021-05-18 ENCOUNTER — Encounter (HOSPITAL_COMMUNITY): Payer: Self-pay | Admitting: Psychiatry

## 2021-05-18 ENCOUNTER — Telehealth (INDEPENDENT_AMBULATORY_CARE_PROVIDER_SITE_OTHER): Payer: Medicare HMO | Admitting: Psychiatry

## 2021-05-18 DIAGNOSIS — F3132 Bipolar disorder, current episode depressed, moderate: Secondary | ICD-10-CM

## 2021-05-18 DIAGNOSIS — F411 Generalized anxiety disorder: Secondary | ICD-10-CM | POA: Diagnosis not present

## 2021-05-18 MED ORDER — ALPRAZOLAM 1 MG PO TABS: 1.0000 mg | ORAL_TABLET | Freq: Four times a day (QID) | ORAL | 2 refills | Status: DC | PRN

## 2021-05-18 MED ORDER — TRAZODONE HCL 100 MG PO TABS: 200.0000 mg | ORAL_TABLET | Freq: Every day | ORAL | 2 refills | Status: DC

## 2021-05-18 MED ORDER — OLANZAPINE 2.5 MG PO TABS: 2.5000 mg | ORAL_TABLET | Freq: Every day | ORAL | 2 refills | Status: DC

## 2021-05-18 NOTE — Progress Notes (Signed)
Virtual Visit via Telephone Note  I connected with Samantha Chambers on 05/18/21 at  4:00 PM EST by telephone and verified that I am speaking with the correct person using two identifiers.  Location: Patient: home Provider: office   I discussed the limitations, risks, security and privacy concerns of performing an evaluation and management service by telephone and the availability of in person appointments. I also discussed with the patient that there may be a patient responsible charge related to this service. The patient expressed understanding and agreed to proceed.      I discussed the assessment and treatment plan with the patient. The patient was provided an opportunity to ask questions and all were answered. The patient agreed with the plan and demonstrated an understanding of the instructions.   The patient was advised to call back or seek an in-person evaluation if the symptoms worsen or if the condition fails to improve as anticipated.  I provided 15 minutes of non-face-to-face time during this encounter.   [name], MD  [facility] MD/PA/NP OP Progress Note  05/18/2021 4:44 PM Samantha Chambers  MRN:  [id number]  Chief Complaint:  Chief Complaint   Depression; Anxiety; Follow-up    HPI: This patient is a 51 year old divorced white female who lives with her 56 year old daughter in [place].  She is on disability.  The patient returns for follow-up after 2 months regarding her anxiety and bipolar disorder.  She is still following through with cardiology.  She still has palpitations and heart rate going up into the 160s.  She is going to have a stress test to try to delineate this a little bit better.  She is on metoprolol which is helped to some degree.  The patient states that she was dating 2 different menand eventually had to choose between the 2 which has been very stressful.  She let go up on relationship and this made her sad for few days.  She does not seem overtly depressed  however denies thoughts of self-harm or suicidal ideation.  She is having trouble sleeping so we will increase the trazodone.  She asked about going on another antidepressant and her cardiologist recommended Wellbutrin.  However I would rather wait till she completes her stress test. Visit Diagnosis:    ICD-10-CM   1. Bipolar 1 disorder, depressed, moderate ([place])  F31.32     2. Generalized anxiety disorder  F41.1       Past Psychiatric History: Long-term outpatient treatment  Past Medical History:  Past Medical History:  Diagnosis Date   Angio-edema    Anxiety    Bipolar disorder ([place])    Depression    Headache(784.0)    Irritable bowel    Urticaria     Past Surgical History:  Procedure Laterality Date   FOOT SURGERY      Family Psychiatric History: see below  Family History:  Family History  Problem Relation Age of Onset   Bipolar disorder Mother    Depression Maternal Aunt    Depression Maternal Uncle    Urticaria Neg Hx    Immunodeficiency Neg Hx    Eczema Neg Hx    Atopy Neg Hx    Asthma Neg Hx    Angioedema Neg Hx    Allergic rhinitis Neg Hx     Social History:  Social History   Socioeconomic History   Marital status: Divorced    Spouse name: Not on file   Number of children: Not on file   Years  of education: Not on file   Highest education level: Not on file  Occupational History   Not on file  Tobacco Use   Smoking status: Never   Smokeless tobacco: Never  Vaping Use   Vaping Use: Never used  Substance and Sexual Activity   Alcohol use: Yes    Comment: Rare glass of wine   Drug use: Yes    Types: Marijuana    Comment: smokes cannabis for anxiety    Sexual activity: Not Currently    Partners: Male    Birth control/protection: Pill  Other Topics Concern   Not on file  Social History Narrative   Not on file   Social Determinants of Health   Financial Resource Strain: Not on file  Food Insecurity: Not on file  Transportation Needs: Not  on file  Physical Activity: Not on file  Stress: Not on file  Social Connections: Not on file    Allergies:  Allergies  Allergen Reactions   Clonazepam Nausea And Vomiting   Sulfa Antibiotics    Vilazodone     Per Pt med made her sick    Metabolic Disorder Labs: No results found for: HGBA1C, MPG No results found for: PROLACTIN No results found for: CHOL, TRIG, HDL, CHOLHDL, VLDL, LDLCALC No results found for: TSH  Therapeutic Level Labs: No results found for: LITHIUM Lab Results  Component Value Date   VALPROATE 33.8 (L) 03/13/2013   No components found for:  CBMZ  Current Medications: Current Outpatient Medications  Medication Sig Dispense Refill   traZODone (DESYREL) 100 MG tablet Take 2 tablets (200 mg total) by mouth at bedtime. 60 tablet 2   ALPRAZolam (XANAX) 1 MG tablet Take 1 tablet (1 mg total) by mouth 4 (four) times daily as needed. 120 tablet 2   EPINEPHrine 0.3 mg/0.3 mL IJ SOAJ injection INJECT 0.3 MLS (0.3 MG TOTAL) INTO THE MUSCLE ONCE FOR 1 DOSE.     famotidine (PEPCID) 20 MG tablet Take 20 mg by mouth every evening.      metoprolol succinate (TOPROL-XL) 25 MG 24 hr tablet      OLANZapine (ZYPREXA) 2.5 MG tablet Take 1 tablet (2.5 mg total) by mouth at bedtime. 90 tablet 2   pantoprazole (PROTONIX) 40 MG tablet TAKE 1 TABLET (40 MG TOTAL) BY MOUTH 2 (TWO) TIMES DAILY BEFORE A MEAL. 180 tablet 1   rosuvastatin (CRESTOR) 5 MG tablet Take 1 tablet by mouth daily.     No current facility-administered medications for this visit.     Musculoskeletal: Strength & Muscle Tone: na Gait & Station: na Patient leans: N/A  Psychiatric Specialty Exam: Review of Systems  Cardiovascular:  Positive for palpitations.  Psychiatric/Behavioral:  Positive for sleep disturbance.   All other systems reviewed and are negative.  There were no vitals taken for this visit.There is no height or weight on file to calculate BMI.  General Appearance: NA  Eye Contact:  NA   Speech:  Clear and Coherent  Volume:  Normal  Mood:  Euthymic  Affect:  NA  Thought Process:  Goal Directed  Orientation:  Full (Time, Place, and Person)  Thought Content: Rumination   Suicidal Thoughts:  No  Homicidal Thoughts:  No  Memory:  Immediate;   Good Recent;   Good Remote;   Good  Judgement:  Fair  Insight:  Fair  Psychomotor Activity:  Normal  Concentration:  Concentration: Good and Attention Span: Good  Recall:  Good  Fund of Knowledge: Good  Language: Good  Akathisia:  No  Handed:  Right  AIMS (if indicated): not done  Assets:  Communication Skills Desire for Improvement Resilience Social Support Talents/Skills  ADL's:  Intact  Cognition: WNL  Sleep:  Poor   Screenings: MDI    Flowsheet Row Office Visit from 11/22/2015 in [facility]  Total Score (max 50) 38      PHQ2-9    Flowsheet Row Video Visit from 05/18/2021 in [facility] Video Visit from 03/20/2021 in [facility] Video Visit from 01/10/2021 in [place] Video Visit from 12/13/2020 in [facility] Video Visit from 09/21/2020 in [facility]  PHQ-2 Total Score 0 0 0 1 0      Flowsheet Row Video Visit from 05/18/2021 in [place] Video Visit from 01/10/2021 in [place] Video Visit from 12/13/2020 in [place] No Risk No Risk No Risk        Assessment and Plan: This patient is a 51 year old female with a history of bipolar disorder ADHD and anxiety.  She is still having symptoms of palpitations and racing heart.  I rather not add any more medications until she has her stress test and she understands  this.  For now we will continue olanzapine 2.5 mg at bedtime for mood stabilization Xanax 1 mg 4 times daily for agitation and anxiety.  We will increase trazodone to 200 mg at bedtime.  She will return to see me in 2 months or call sooner if she decides she wants to try Wellbutrin.   [name], MD 05/18/2021, 4:44 PM

## 2021-05-25 ENCOUNTER — Telehealth (HOSPITAL_COMMUNITY): Payer: Self-pay | Admitting: *Deleted

## 2021-05-25 NOTE — Telephone Encounter (Signed)
Patient called stating her Trazodone is not working. Per pt she wants to know if she can increase it again or not. Per pt she goes to sleep at 12 and wakes back up at 4:30 and can't go back to sleep at all. Per pt she tried it a couple of days.

## 2021-05-30 DIAGNOSIS — R000 Tachycardia, unspecified: Secondary | ICD-10-CM | POA: Diagnosis not present

## 2021-06-02 DIAGNOSIS — M25511 Pain in right shoulder: Secondary | ICD-10-CM | POA: Diagnosis not present

## 2021-06-09 DIAGNOSIS — Z8249 Family history of ischemic heart disease and other diseases of the circulatory system: Secondary | ICD-10-CM | POA: Diagnosis not present

## 2021-06-09 DIAGNOSIS — R000 Tachycardia, unspecified: Secondary | ICD-10-CM | POA: Diagnosis not present

## 2021-06-10 ENCOUNTER — Other Ambulatory Visit (HOSPITAL_COMMUNITY): Payer: Self-pay | Admitting: Psychiatry

## 2021-06-12 ENCOUNTER — Telehealth (INDEPENDENT_AMBULATORY_CARE_PROVIDER_SITE_OTHER): Payer: Medicare HMO | Admitting: Psychiatry

## 2021-06-12 ENCOUNTER — Encounter (HOSPITAL_COMMUNITY): Payer: Self-pay | Admitting: Psychiatry

## 2021-06-12 ENCOUNTER — Other Ambulatory Visit: Payer: Self-pay

## 2021-06-12 DIAGNOSIS — F3132 Bipolar disorder, current episode depressed, moderate: Secondary | ICD-10-CM

## 2021-06-12 DIAGNOSIS — F411 Generalized anxiety disorder: Secondary | ICD-10-CM | POA: Diagnosis not present

## 2021-06-12 MED ORDER — OLANZAPINE 5 MG PO TABS: 5.0000 mg | ORAL_TABLET | Freq: Every day | ORAL | 2 refills | Status: DC

## 2021-06-12 MED ORDER — ZOLPIDEM TARTRATE 10 MG PO TABS: 10.0000 mg | ORAL_TABLET | Freq: Every evening | ORAL | 2 refills | Status: DC | PRN

## 2021-06-12 MED ORDER — ALPRAZOLAM 1 MG PO TABS
1.0000 mg | ORAL_TABLET | Freq: Four times a day (QID) | ORAL | 2 refills | Status: DC | PRN
Start: 2021-06-12 — End: 2021-08-14

## 2021-06-12 NOTE — Progress Notes (Signed)
Virtual Visit via Telephone Note  I connected with Samantha Chambers on 06/12/21 at 11:40 AM EST by telephone and verified that I am speaking with the correct person using two identifiers.  Location: Patient: home Provider: office   I discussed the limitations, risks, security and privacy concerns of performing an evaluation and management service by telephone and the availability of in person appointments. I also discussed with the patient that there may be a patient responsible charge related to this service. The patient expressed understanding and agreed to proceed.     I discussed the assessment and treatment plan with the patient. The patient was provided an opportunity to ask questions and all were answered. The patient agreed with the plan and demonstrated an understanding of the instructions.   The patient was advised to call back or seek an in-person evaluation if the symptoms worsen or if the condition fails to improve as anticipated.  I provided 20 minutes of non-face-to-face time during this encounter.   [name], MD  [facility] [place] MD/PA/NP OP Progress Note  06/12/2021 11:55 AM Samantha Chambers  MRN:  [id number]  Chief Complaint:  Chief Complaint   Manic Behavior; Depression; Anxiety; Follow-up    HPI: This patient is a 51 year old divorced female who lives with her 79 year old daughter in [place].  She is on disability.  The patient returns after 4 weeks regarding her anxiety and bipolar disorder.  She is completed her stress test with cardiology and everything turned out normal.  Once in a while she feels palpitations but she is "not worrying about it."  She states that her moods have been "all over the place" she still having a lot of stress with her teenage daughter.  She has been dating 2 different men and one of them is married although she states he is very loving and attentive towards her.  She has had a lot of difficulty sleeping even with the increase in trazodone to 200  mg.  She feels quite anxious in the evenings.  I suggested that we go up on the olanzapine and she agrees.  We can also retry Ambien.  Xanax continues to help with her anxiety.  Denies significant depression or suicidal ideation Visit Diagnosis:    ICD-10-CM   1. Bipolar 1 disorder, depressed, moderate ([place])  F31.32     2. Generalized anxiety disorder  F41.1       Past Psychiatric History: Long-term outpatient treatment  Past Medical History:  Past Medical History:  Diagnosis Date   Angio-edema    Anxiety    Bipolar disorder ([place])    Depression    Headache(784.0)    Irritable bowel    Urticaria     Past Surgical History:  Procedure Laterality Date   FOOT SURGERY      Family Psychiatric History: see below  Family History:  Family History  Problem Relation Age of Onset   Bipolar disorder Mother    Depression Maternal Aunt    Depression Maternal Uncle    Urticaria Neg Hx    Immunodeficiency Neg Hx    Eczema Neg Hx    Atopy Neg Hx    Asthma Neg Hx    Angioedema Neg Hx    Allergic rhinitis Neg Hx     Social History:  Social History   Socioeconomic History   Marital status: Divorced    Spouse name: Not on file   Number of children: Not on file   Years of education: Not on file  Highest education level: Not on file  Occupational History   Not on file  Tobacco Use   Smoking status: Never   Smokeless tobacco: Never  Vaping Use   Vaping Use: Never used  Substance and Sexual Activity   Alcohol use: Yes    Comment: Rare glass of wine   Drug use: Yes    Types: Marijuana    Comment: smokes cannabis for anxiety    Sexual activity: Not Currently    Partners: Male    Birth control/protection: Pill  Other Topics Concern   Not on file  Social History Narrative   Not on file   Social Determinants of Health   Financial Resource Strain: Not on file  Food Insecurity: Not on file  Transportation Needs: Not on file  Physical Activity: Not on file  Stress: Not  on file  Social Connections: Not on file    Allergies:  Allergies  Allergen Reactions   Clonazepam Nausea And Vomiting   Sulfa Antibiotics    Vilazodone     Per Pt med made her sick    Metabolic Disorder Labs: No results found for: HGBA1C, MPG No results found for: PROLACTIN No results found for: CHOL, TRIG, HDL, CHOLHDL, VLDL, LDLCALC No results found for: TSH  Therapeutic Level Labs: No results found for: LITHIUM Lab Results  Component Value Date   VALPROATE 33.8 (L) 03/13/2013   No components found for:  CBMZ  Current Medications: Current Outpatient Medications  Medication Sig Dispense Refill   OLANZapine (ZYPREXA) 5 MG tablet Take 1 tablet (5 mg total) by mouth at bedtime. 30 tablet 2   ALPRAZolam (XANAX) 1 MG tablet Take 1 tablet (1 mg total) by mouth 4 (four) times daily as needed. 120 tablet 2   EPINEPHrine 0.3 mg/0.3 mL IJ SOAJ injection INJECT 0.3 MLS (0.3 MG TOTAL) INTO THE MUSCLE ONCE FOR 1 DOSE.     famotidine (PEPCID) 20 MG tablet Take 20 mg by mouth every evening.      metoprolol succinate (TOPROL-XL) 25 MG 24 hr tablet      pantoprazole (PROTONIX) 40 MG tablet TAKE 1 TABLET (40 MG TOTAL) BY MOUTH 2 (TWO) TIMES DAILY BEFORE A MEAL. 180 tablet 1   rosuvastatin (CRESTOR) 5 MG tablet Take 1 tablet by mouth daily.     zolpidem (AMBIEN) 10 MG tablet Take 1 tablet (10 mg total) by mouth at bedtime as needed for sleep. 30 tablet 2   No current facility-administered medications for this visit.     Musculoskeletal: Strength & Muscle Tone: na Gait & Station: na Patient leans: N/A  Psychiatric Specialty Exam: Review of Systems  Musculoskeletal:  Positive for arthralgias.  Psychiatric/Behavioral:  Positive for sleep disturbance. The patient is nervous/anxious.   All other systems reviewed and are negative.  There were no vitals taken for this visit.There is no height or weight on file to calculate BMI.  General Appearance: NA  Eye Contact:  NA  Speech:  Clear  and Coherent  Volume:  Normal  Mood:  Anxious  Affect:  NA  Thought Process:  Goal Directed  Orientation:  Full (Time, Place, and Person)  Thought Content: Rumination   Suicidal Thoughts:  No  Homicidal Thoughts:  No  Memory:  Immediate;   Good Recent;   Good Remote;   Good  Judgement:  Good  Insight:  Good  Psychomotor Activity:  Normal  Concentration:  Concentration: Fair and Attention Span: Fair  Recall:  Good  Fund of Knowledge: Good  Language: Good  Akathisia:  No  Handed:  Right  AIMS (if indicated): not done  Assets:  Communication Skills Desire for Improvement Physical Health Resilience Social Support Talents/Skills  ADL's:  Intact  Cognition: WNL  Sleep:  Poor   Screenings: MDI    Flowsheet Row Office Visit from 11/22/2015 in [facility]  Total Score (max 50) 38      PHQ2-9    Flowsheet Row Video Visit from 06/12/2021 in [facility] [place] Video Visit from 05/18/2021 in [facility] Video Visit from 03/20/2021 in [facility] Video Visit from 01/10/2021 in [facility] Video Visit from 12/13/2020 in [facility]  PHQ-2 Total Score 1 0 0 0 1      Flowsheet Row Video Visit from 06/12/2021 in [facility]- Video Visit from 05/18/2021 in [facility]- Video Visit from 01/10/2021 in [place] No Risk No Risk No Risk        Assessment and Plan: This patient is a 51 year old female with a history of bipolar disorder ADHD and anxiety.  Now that she has been cleared by cardiology we can increase olanzapine to 5 mg at bedtime for mood stabilization.  Since she is not sleeping we will go  back to Ambien 10 mg at bedtime.  She will continue Xanax 1 mg 4 times daily for agitation and anxiety.  She will return to see me in 4 weeks   [name], MD 06/12/2021, 11:55 AM

## 2021-06-16 DIAGNOSIS — M7591 Shoulder lesion, unspecified, right shoulder: Secondary | ICD-10-CM | POA: Diagnosis not present

## 2021-06-16 DIAGNOSIS — M25511 Pain in right shoulder: Secondary | ICD-10-CM | POA: Diagnosis not present

## 2021-06-16 DIAGNOSIS — M75121 Complete rotator cuff tear or rupture of right shoulder, not specified as traumatic: Secondary | ICD-10-CM | POA: Diagnosis not present

## 2021-06-23 DIAGNOSIS — M25512 Pain in left shoulder: Secondary | ICD-10-CM | POA: Diagnosis not present

## 2021-06-23 DIAGNOSIS — M25511 Pain in right shoulder: Secondary | ICD-10-CM | POA: Diagnosis not present

## 2021-06-26 DIAGNOSIS — M25612 Stiffness of left shoulder, not elsewhere classified: Secondary | ICD-10-CM | POA: Diagnosis not present

## 2021-06-26 DIAGNOSIS — M25511 Pain in right shoulder: Secondary | ICD-10-CM | POA: Diagnosis not present

## 2021-06-26 DIAGNOSIS — R29898 Other symptoms and signs involving the musculoskeletal system: Secondary | ICD-10-CM | POA: Diagnosis not present

## 2021-06-30 DIAGNOSIS — R29898 Other symptoms and signs involving the musculoskeletal system: Secondary | ICD-10-CM | POA: Diagnosis not present

## 2021-06-30 DIAGNOSIS — M25511 Pain in right shoulder: Secondary | ICD-10-CM | POA: Diagnosis not present

## 2021-06-30 DIAGNOSIS — M25612 Stiffness of left shoulder, not elsewhere classified: Secondary | ICD-10-CM | POA: Diagnosis not present

## 2021-07-03 DIAGNOSIS — M25511 Pain in right shoulder: Secondary | ICD-10-CM | POA: Diagnosis not present

## 2021-07-03 DIAGNOSIS — M25612 Stiffness of left shoulder, not elsewhere classified: Secondary | ICD-10-CM | POA: Diagnosis not present

## 2021-07-03 DIAGNOSIS — R29898 Other symptoms and signs involving the musculoskeletal system: Secondary | ICD-10-CM | POA: Diagnosis not present

## 2021-07-06 DIAGNOSIS — R29898 Other symptoms and signs involving the musculoskeletal system: Secondary | ICD-10-CM | POA: Diagnosis not present

## 2021-07-06 DIAGNOSIS — M25511 Pain in right shoulder: Secondary | ICD-10-CM | POA: Diagnosis not present

## 2021-07-06 DIAGNOSIS — M25612 Stiffness of left shoulder, not elsewhere classified: Secondary | ICD-10-CM | POA: Diagnosis not present

## 2021-07-07 ENCOUNTER — Other Ambulatory Visit (HOSPITAL_COMMUNITY): Payer: Self-pay | Admitting: Psychiatry

## 2021-07-10 ENCOUNTER — Telehealth (INDEPENDENT_AMBULATORY_CARE_PROVIDER_SITE_OTHER): Payer: Medicare HMO | Admitting: Psychiatry

## 2021-07-10 ENCOUNTER — Encounter (HOSPITAL_COMMUNITY): Payer: Self-pay | Admitting: Psychiatry

## 2021-07-10 ENCOUNTER — Other Ambulatory Visit: Payer: Self-pay

## 2021-07-10 DIAGNOSIS — F3132 Bipolar disorder, current episode depressed, moderate: Secondary | ICD-10-CM | POA: Diagnosis not present

## 2021-07-10 DIAGNOSIS — F411 Generalized anxiety disorder: Secondary | ICD-10-CM | POA: Diagnosis not present

## 2021-07-10 MED ORDER — DIAZEPAM 10 MG PO TABS: 10.0000 mg | ORAL_TABLET | Freq: Four times a day (QID) | ORAL | 0 refills | Status: DC

## 2021-07-10 MED ORDER — AMITRIPTYLINE HCL 25 MG PO TABS: ORAL_TABLET | ORAL | 1 refills | Status: DC

## 2021-07-10 NOTE — Progress Notes (Signed)
Virtual Visit via Telephone Note  I connected with Samantha Chambers on 07/10/21 at  1:40 PM EST by telephone and verified that I am speaking with the correct person using two identifiers.  Location: Patient: home Provider: office   I discussed the limitations, risks, security and privacy concerns of performing an evaluation and management service by telephone and the availability of in person appointments. I also discussed with the patient that there may be a patient responsible charge related to this service. The patient expressed understanding and agreed to proceed.     I discussed the assessment and treatment plan with the patient. The patient was provided an opportunity to ask questions and all were answered. The patient agreed with the plan and demonstrated an understanding of the instructions.   The patient was advised to call back or seek an in-person evaluation if the symptoms worsen or if the condition fails to improve as anticipated.  I provided 15 minutes of non-face-to-face time during this encounter.   Samantha Spiller, MD  [facility] MD/PA/NP OP Progress Note  07/10/2021 2:08 PM [name]  MRN:  [id number]  Chief Complaint:  Chief Complaint  Patient presents with   Anxiety   Follow-up   Depression   Manic Behavior   HPI: The patient is a 51 year old divorced female who lives with her 83 year old daughter in [place].  She is on disability.  The patient returns for follow-up after 4 weeks regarding her anxiety and bipolar disorder.  She thinks she is getting hypomanic.  She is dating 2 different man and seems somewhat obsessed with sex.  She is going on a trip to [facility] next week with one of them who is married.  She tried to let go of the other man but "just could not."  She does not seem to understand that this is probably not going to and well with either of them.  Last time we added Ambien to help her with sleep but it made her too groggy.  Now that her cardiology tests  have turned out negative she would like to go back to amitriptyline because it really helped "even me out."  This is a bit counterintuitive since is an antidepressant.  The Xanax continues to help with her anxiety although she asked to switch to Valium for her trip because it makes her less drowsy.  She denies significant depression or suicidal ideation.  Most of the time she is sleeping okay but not great.  Denies any thoughts of self-harm Visit Diagnosis:    ICD-10-CM   1. Bipolar 1 disorder, depressed, moderate ([place])  F31.32     2. Generalized anxiety disorder  F41.1       Past Psychiatric History: Long-term outpatient treatment  Past Medical History:  Past Medical History:  Diagnosis Date   Angio-edema    Anxiety    Bipolar disorder ([place])    Depression    Headache(784.0)    Irritable bowel    Urticaria     Past Surgical History:  Procedure Laterality Date   FOOT SURGERY      Family Psychiatric History: see below  Family History:  Family History  Problem Relation Age of Onset   Bipolar disorder Mother    Depression Maternal Aunt    Depression Maternal Uncle    Urticaria Neg Hx    Immunodeficiency Neg Hx    Eczema Neg Hx    Atopy Neg Hx    Asthma Neg Hx    Angioedema Neg  Hx    Allergic rhinitis Neg Hx     Social History:  Social History   Socioeconomic History   Marital status: Divorced    Spouse name: Not on file   Number of children: Not on file   Years of education: Not on file   Highest education level: Not on file  Occupational History   Not on file  Tobacco Use   Smoking status: Never   Smokeless tobacco: Never  Vaping Use   Vaping Use: Never used  Substance and Sexual Activity   Alcohol use: Yes    Comment: Rare glass of wine   Drug use: Yes    Types: Marijuana    Comment: smokes cannabis for anxiety    Sexual activity: Not Currently    Partners: Male    Birth control/protection: Pill  Other Topics Concern   Not on file  Social History  Narrative   Not on file   Social Determinants of Health   Financial Resource Strain: Not on file  Food Insecurity: Not on file  Transportation Needs: Not on file  Physical Activity: Not on file  Stress: Not on file  Social Connections: Not on file    Allergies:  Allergies  Allergen Reactions   Clonazepam Nausea And Vomiting   Sulfa Antibiotics    Vilazodone     Per Pt med made her sick    Metabolic Disorder Labs: No results found for: HGBA1C, MPG No results found for: PROLACTIN No results found for: CHOL, TRIG, HDL, CHOLHDL, VLDL, LDLCALC No results found for: TSH  Therapeutic Level Labs: No results found for: LITHIUM Lab Results  Component Value Date   VALPROATE 33.8 (L) 03/13/2013   No components found for:  CBMZ  Current Medications: Current Outpatient Medications  Medication Sig Dispense Refill   ALPRAZolam (XANAX) 1 MG tablet Take 1 tablet (1 mg total) by mouth 4 (four) times daily as needed. 120 tablet 2   amitriptyline (ELAVIL) 25 MG tablet TAKE 1 TABLET BY MOUTH EVERYDAY AT BEDTIME 90 tablet 1   diazepam (VALIUM) 10 MG tablet Take 1 tablet (10 mg total) by mouth 4 (four) times daily. 30 tablet 0   EPINEPHrine 0.3 mg/0.3 mL IJ SOAJ injection INJECT 0.3 MLS (0.3 MG TOTAL) INTO THE MUSCLE ONCE FOR 1 DOSE.     famotidine (PEPCID) 20 MG tablet Take 20 mg by mouth every evening.      metoprolol succinate (TOPROL-XL) 25 MG 24 hr tablet      OLANZapine (ZYPREXA) 5 MG tablet TAKE 1 TABLET BY MOUTH EVERYDAY AT BEDTIME 90 tablet 1   pantoprazole (PROTONIX) 40 MG tablet TAKE 1 TABLET (40 MG TOTAL) BY MOUTH 2 (TWO) TIMES DAILY BEFORE A MEAL. 180 tablet 1   rosuvastatin (CRESTOR) 5 MG tablet Take 1 tablet by mouth daily.     No current facility-administered medications for this visit.     Musculoskeletal: Strength & Muscle Tone: na Gait & Station: na Patient leans: N/A  Psychiatric Specialty Exam: Review of Systems  Musculoskeletal:  Positive for arthralgias.   Psychiatric/Behavioral:  Positive for sleep disturbance. The patient is nervous/anxious.   All other systems reviewed and are negative.  There were no vitals taken for this visit.There is no height or weight on file to calculate BMI.  General Appearance: NA  Eye Contact:  NA  Speech:  Clear and Coherent  Volume:  Normal  Mood:  Anxious  Affect:  NA  Thought Process:  Goal Directed  Orientation:  Full (Time, Place, and Person)  Thought Content: Rumination   Suicidal Thoughts:  No  Homicidal Thoughts:  No  Memory:  Immediate;   Good Recent;   Good Remote;   Good  Judgement: Fair but seems to be using poor judgment in terms of relationship like seeing 2 people at once  Insight:  Fair  Psychomotor Activity:  Restlessness  Concentration:  Concentration: Fair and Attention Span: Fair  Recall:  Good  Fund of Knowledge: Good  Language: Good  Akathisia:  No  Handed:  Right  AIMS (if indicated): not done  Assets:  Communication Skills Desire for Improvement Physical Health Resilience Social Support Talents/Skills  ADL's:  Intact  Cognition: WNL  Sleep:  Fair   Screenings: MDI    [place] Office Visit from 11/22/2015 in [facility]  Total Score (max 50) 38      PHQ2-9    Flowsheet Row Video Visit from 07/10/2021 in [place] Video Visit from 06/12/2021 in [place] Video Visit from 05/18/2021 in [place] Video Visit from 03/20/2021 in [place] Video Visit from 01/10/2021 in [facility]  PHQ-2 Total Score 1 1 0 0 0      Flowsheet Row Video Visit from 07/10/2021 in [place] Video Visit from 06/12/2021 in [place] Video Visit from  05/18/2021 in [place] No Risk No Risk No Risk        Assessment and Plan: This patient is a 51 year old female with a history of bipolar disorder ADHD and anxiety.  She seems to be somewhat hypomanic given her impulsivity related to relationships.  She claims she did better with the addition of amitriptyline so we will restart at 25 mg at bedtime along with the olanzapine 5 mg at bedtime for mood stabilization.  She will continue Xanax 1 mg 4 times daily for anxiety.  However she wants Valium to use for 1 week while during her trip so I will send this in.  She will return to see me in 4 weeks  Collaboration of Care: Collaboration of Care: Primary Care Provider AEB chart notes will be available to primary care at patient's request  Patient/Guardian was advised Release of Information must be obtained prior to any record release in order to collaborate their care with an outside provider. Patient/Guardian was advised if they have not already done so to contact the registration department to sign all necessary forms in order for [place] to release information regarding their care.   Consent: Patient/Guardian gives verbal consent for treatment and assignment of benefits for services provided during this visit. Patient/Guardian expressed understanding and agreed to proceed.    Samantha Spiller, MD 07/10/2021, 2:08 PM

## 2021-07-11 DIAGNOSIS — R29898 Other symptoms and signs involving the musculoskeletal system: Secondary | ICD-10-CM | POA: Diagnosis not present

## 2021-07-11 DIAGNOSIS — M25511 Pain in right shoulder: Secondary | ICD-10-CM | POA: Diagnosis not present

## 2021-07-11 DIAGNOSIS — M25612 Stiffness of left shoulder, not elsewhere classified: Secondary | ICD-10-CM | POA: Diagnosis not present

## 2021-07-14 DIAGNOSIS — R29898 Other symptoms and signs involving the musculoskeletal system: Secondary | ICD-10-CM | POA: Diagnosis not present

## 2021-07-14 DIAGNOSIS — M25612 Stiffness of left shoulder, not elsewhere classified: Secondary | ICD-10-CM | POA: Diagnosis not present

## 2021-07-14 DIAGNOSIS — M25511 Pain in right shoulder: Secondary | ICD-10-CM | POA: Diagnosis not present

## 2021-07-17 DIAGNOSIS — M25511 Pain in right shoulder: Secondary | ICD-10-CM | POA: Diagnosis not present

## 2021-07-17 DIAGNOSIS — M75101 Unspecified rotator cuff tear or rupture of right shoulder, not specified as traumatic: Secondary | ICD-10-CM | POA: Diagnosis not present

## 2021-07-17 DIAGNOSIS — M7542 Impingement syndrome of left shoulder: Secondary | ICD-10-CM | POA: Diagnosis not present

## 2021-07-17 DIAGNOSIS — M25512 Pain in left shoulder: Secondary | ICD-10-CM | POA: Diagnosis not present

## 2021-07-18 DIAGNOSIS — M25612 Stiffness of left shoulder, not elsewhere classified: Secondary | ICD-10-CM | POA: Diagnosis not present

## 2021-07-18 DIAGNOSIS — R29898 Other symptoms and signs involving the musculoskeletal system: Secondary | ICD-10-CM | POA: Diagnosis not present

## 2021-07-18 DIAGNOSIS — M25511 Pain in right shoulder: Secondary | ICD-10-CM | POA: Diagnosis not present

## 2021-07-26 DIAGNOSIS — M25612 Stiffness of left shoulder, not elsewhere classified: Secondary | ICD-10-CM | POA: Diagnosis not present

## 2021-07-26 DIAGNOSIS — M25511 Pain in right shoulder: Secondary | ICD-10-CM | POA: Diagnosis not present

## 2021-07-26 DIAGNOSIS — R29898 Other symptoms and signs involving the musculoskeletal system: Secondary | ICD-10-CM | POA: Diagnosis not present

## 2021-08-14 ENCOUNTER — Telehealth (INDEPENDENT_AMBULATORY_CARE_PROVIDER_SITE_OTHER): Payer: Medicare HMO | Admitting: Psychiatry

## 2021-08-14 ENCOUNTER — Encounter (HOSPITAL_COMMUNITY): Payer: Self-pay | Admitting: Psychiatry

## 2021-08-14 DIAGNOSIS — F411 Generalized anxiety disorder: Secondary | ICD-10-CM

## 2021-08-14 DIAGNOSIS — F3132 Bipolar disorder, current episode depressed, moderate: Secondary | ICD-10-CM | POA: Diagnosis not present

## 2021-08-14 MED ORDER — ALPRAZOLAM 1 MG PO TABS
1.0000 mg | ORAL_TABLET | Freq: Four times a day (QID) | ORAL | 2 refills | Status: DC | PRN
Start: 2021-08-14 — End: 2021-09-12

## 2021-08-14 MED ORDER — OLANZAPINE 5 MG PO TABS: ORAL_TABLET | ORAL | 1 refills | Status: DC

## 2021-08-14 MED ORDER — AMITRIPTYLINE HCL 25 MG PO TABS: ORAL_TABLET | ORAL | 1 refills | Status: DC

## 2021-08-14 NOTE — Progress Notes (Signed)
Virtual Visit via Telephone Note ? ?I connected with Samantha Chambers on 08/14/21 at  2:20 PM EDT by telephone and verified that I am speaking with the correct person using two identifiers. ? ?Location: ?Patient: home ?Provider: home office ?  ?I discussed the limitations, risks, security and privacy concerns of performing an evaluation and management service by telephone and the availability of in person appointments. I also discussed with the patient that there may be a patient responsible charge related to this service. The patient expressed understanding and agreed to proceed. ? ? ? ?  ?I discussed the assessment and treatment plan with the patient. The patient was provided an opportunity to ask questions and all were answered. The patient agreed with the plan and demonstrated an understanding of the instructions. ?  ?The patient was advised to call back or seek an in-person evaluation if the symptoms worsen or if the condition fails to improve as anticipated. ? ?I provided 20 minutes of non-face-to-face time during this encounter. ? ? ?Samantha Spiller, MD ? ?[facility] MD/PA/NP OP Progress Note ? ?08/14/2021 2:41 PM ?Samantha Chambers  ?MRN:  [id number] ? ?Chief Complaint:  ?Chief Complaint  ?Patient presents with  ? Anxiety  ? Depression  ? Manic Behavior  ? Follow-up  ? ?HPI: The patient is a 51 year old divorced female who lives with her 4 year old daughter in [place].  She is on disability. ? ?The patient returns for follow-up after 2 months.  She states she has been under a lot of stress lately.  Her 66 year old daughter has been very oppositional angry and out-of-control.  We talked about this numerous times.  The daughter has completed intensive in-home services and may need out-of-home placement.  The patient was also dating 2 different men 1 of whom was married.  She is finally let go of that 1.  She is not really all that happy with the other man however.  It seems as if she has made some impulsive decisions lately  about relationships. ? ?She denies significant depression but seems more anxious about other things such as her parents health.  She is sleeping well most of the time.  She is back to using the Xanax for anxiety.  She continues on the olanzapine.  She is worried that the amitriptyline may have raised her heart rate again and I urged her to call her cardiologist about this.  She denies other manic symptoms such as racing thoughts or hyper spending. ?Visit Diagnosis:  ?  ICD-10-CM   ?1. Bipolar 1 disorder, depressed, moderate ([facility])  F31.32   ?  ?2. Generalized anxiety disorder  F41.1   ?  ? ? ?Past Psychiatric History: Long-term outpatient treatment  ? ?Past Medical History:  ?Past Medical History:  ?Diagnosis Date  ? Angio-edema   ? Anxiety   ? Bipolar disorder ([place])   ? Depression   ? Headache(784.0)   ? Irritable bowel   ? Urticaria   ?  ?Past Surgical History:  ?Procedure Laterality Date  ? FOOT SURGERY    ? ? ?Family Psychiatric History: see below ? ?Family History:  ?Family History  ?Problem Relation Age of Onset  ? Bipolar disorder Mother   ? Depression Maternal Aunt   ? Depression Maternal Uncle   ? Urticaria Neg Hx   ? Immunodeficiency Neg Hx   ? Eczema Neg Hx   ? Atopy Neg Hx   ? Asthma Neg Hx   ? Angioedema Neg Hx   ? Allergic rhinitis Neg  Hx   ? ? ?Social History:  ?Social History  ? ?Socioeconomic History  ? Marital status: Divorced  ?  Spouse name: Not on file  ? Number of children: Not on file  ? Years of education: Not on file  ? Highest education level: Not on file  ?Occupational History  ? Not on file  ?Tobacco Use  ? Smoking status: Never  ? Smokeless tobacco: Never  ?Vaping Use  ? Vaping Use: Never used  ?Substance and Sexual Activity  ? Alcohol use: Yes  ?  Comment: Rare glass of wine  ? Drug use: Yes  ?  Types: Marijuana  ?  Comment: smokes cannabis for anxiety   ? Sexual activity: Not Currently  ?  Partners: Male  ?  Birth control/protection: Pill  ?Other Topics Concern  ? Not on file  ?Social  History Narrative  ? Not on file  ? ?Social Determinants of Health  ? ?Financial Resource Strain: Not on file  ?Food Insecurity: Not on file  ?Transportation Needs: Not on file  ?Physical Activity: Not on file  ?Stress: Not on file  ?Social Connections: Not on file  ? ? ?Allergies:  ?Allergies  ?Allergen Reactions  ? Clonazepam Nausea And Vomiting  ? Sulfa Antibiotics   ? Vilazodone   ?  Per Pt med made her sick  ? ? ?Metabolic Disorder Labs: ?No results found for: HGBA1C, MPG ?No results found for: PROLACTIN ?No results found for: CHOL, TRIG, HDL, CHOLHDL, VLDL, LDLCALC ?No results found for: TSH ? ?Therapeutic Level Labs: ?No results found for: LITHIUM ?Lab Results  ?Component Value Date  ? VALPROATE 33.8 (L) 03/13/2013  ? ?No components found for:  CBMZ ? ?Current Medications: ?Current Outpatient Medications  ?Medication Sig Dispense Refill  ? ALPRAZolam (XANAX) 1 MG tablet Take 1 tablet (1 mg total) by mouth 4 (four) times daily as needed. 120 tablet 2  ? amitriptyline (ELAVIL) 25 MG tablet TAKE 1 TABLET BY MOUTH EVERYDAY AT BEDTIME 90 tablet 1  ? EPINEPHrine 0.3 mg/0.3 mL IJ SOAJ injection INJECT 0.3 MLS (0.3 MG TOTAL) INTO THE MUSCLE ONCE FOR 1 DOSE.    ? famotidine (PEPCID) 20 MG tablet Take 20 mg by mouth every evening.     ? metoprolol succinate (TOPROL-XL) 25 MG 24 hr tablet     ? OLANZapine (ZYPREXA) 5 MG tablet TAKE 1 TABLET BY MOUTH EVERYDAY AT BEDTIME 90 tablet 1  ? pantoprazole (PROTONIX) 40 MG tablet TAKE 1 TABLET (40 MG TOTAL) BY MOUTH 2 (TWO) TIMES DAILY BEFORE A MEAL. 180 tablet 1  ? rosuvastatin (CRESTOR) 5 MG tablet Take 1 tablet by mouth daily.    ? ?No current facility-administered medications for this visit.  ? ? ? ?Musculoskeletal: ?Strength & Muscle Tone: na ?Gait & Station: na ?Patient leans: N/A ? ?Psychiatric Specialty Exam: ?Review of Systems  ?Psychiatric/Behavioral:  The patient is nervous/anxious.   ?All other systems reviewed and are negative.  ?There were no vitals taken for this  visit.There is no height or weight on file to calculate BMI.  ?General Appearance: NA  ?Eye Contact:  NA  ?Speech:  Clear and Coherent  ?Volume:  Normal  ?Mood:  Anxious and Euthymic  ?Affect:  NA  ?Thought Process:  Goal Directed  ?Orientation:  Full (Time, Place, and Person)  ?Thought Content: Rumination   ?Suicidal Thoughts:  No  ?Homicidal Thoughts:  No  ?Memory:  Immediate;   Good ?Recent;   Good ?Remote;   Good  ?Judgement:  Good  ?Insight:  Fair  ?Psychomotor Activity:  Normal  ?Concentration:  Concentration: Good and Attention Span: Good  ?Recall:  Good  ?Fund of Knowledge: Good  ?Language: Good  ?Akathisia:  No  ?Handed:  Right  ?AIMS (if indicated): not done  ?Assets:  Communication Skills ?Desire for Improvement ?Physical Health ?Resilience ?Social Support  ?ADL's:  Intact  ?Cognition: WNL  ?Sleep:  Good  ? ?Screenings: ?MDI   ? ?[place] Office Visit from 11/22/2015 in [place]  ?Total Score (max 50) 38  ? ?  ? ?PHQ2-9   ? ?Flowsheet Row Video Visit from 08/14/2021 in [facility] [place] Video Visit from 07/10/2021 in [place] Video Visit from 06/12/2021 in [place] Video Visit from 05/18/2021 in [place] Video Visit from 03/20/2021 in [facility]  ?PHQ-2 Total Score '1 1 1 '$ 0 0  ? ?  ? ?Flowsheet Row Video Visit from 08/14/2021 in [place] Video Visit from 07/10/2021 in [place] Video Visit from 06/12/2021 in [facility]  ?C-SSRS RISK CATEGORY No Risk No Risk No Risk  ? ?  ? ? ? ?Assessment and Plan: This patient is a 51 year old female with a history of bipolar disorder and anxiety.  It is unclear whether she is hypomanic  or just anxious.  She is continuing on Xanax 1 mg 4 times daily for anxiety.  She will continue the olanzapine 5 mg at bedtime for mood stabilization.  She is taking amitriptyline 25 mg at bedtime but i

## 2021-09-12 ENCOUNTER — Telehealth (INDEPENDENT_AMBULATORY_CARE_PROVIDER_SITE_OTHER): Payer: Medicare HMO | Admitting: Psychiatry

## 2021-09-12 ENCOUNTER — Encounter (HOSPITAL_COMMUNITY): Payer: Self-pay | Admitting: Psychiatry

## 2021-09-12 DIAGNOSIS — F411 Generalized anxiety disorder: Secondary | ICD-10-CM | POA: Diagnosis not present

## 2021-09-12 DIAGNOSIS — F3132 Bipolar disorder, current episode depressed, moderate: Secondary | ICD-10-CM

## 2021-09-12 MED ORDER — OLANZAPINE 10 MG PO TABS: 10.0000 mg | ORAL_TABLET | Freq: Every day | ORAL | 2 refills | Status: DC

## 2021-09-12 MED ORDER — ALPRAZOLAM 1 MG PO TABS: 1.0000 mg | ORAL_TABLET | Freq: Four times a day (QID) | ORAL | 2 refills | Status: DC | PRN

## 2021-09-12 NOTE — Progress Notes (Signed)
Virtual Visit via Video Note ? ?I connected with Samantha Chambers on 09/12/21 at  4:20 PM EDT by a video enabled telemedicine application and verified that I am speaking with the correct person using two identifiers. ? ?Location: ?Patient: home ?Provider: office ?  ?I discussed the limitations of evaluation and management by telemedicine and the availability of in person appointments. The patient expressed understanding and agreed to proceed. ? ? ? ?  ?I discussed the assessment and treatment plan with the patient. The patient was provided an opportunity to ask questions and all were answered. The patient agreed with the plan and demonstrated an understanding of the instructions. ?  ?The patient was advised to call back or seek an in-person evaluation if the symptoms worsen or if the condition fails to improve as anticipated. ? ?I provided 15 minutes of non-face-to-face time during this encounter. ? ? ?[name], MD ? ?[name] MD/PA/NP OP Progress Note ? ?09/12/2021 4:41 PM ?Samantha Chambers  ?MRN:  [id number] ? ?Chief Complaint:  ?Chief Complaint  ?Patient presents with  ? Depression  ? Anxiety  ? Manic Behavior  ? Follow-up  ? ?HPI: The patient is a 51 year old divorced female who lives with her 36 year old daughter in [place].  She is on disability. ? ?The patient returns for follow-up after 4 weeks.  She states that she was very stressed recently.  She was dating 2 men 1 single and 1 married.  She broke it off with a married man and continue to see the single man.  However she states he just suddenly dropped her and stopped talking to her.  When she finally got an answer out of him about 2 weeks ago he stated that he just did not want to be in a relationship.  She states that this really broke her heart and she has been very depressed since then.  She has not been eating and has lost down to 111 pounds.  We tried adding the amitriptyline back last time but it caused the tachycardia to recur and she stopped it.  I  suggested that she get back on a higher dosage of olanzapine and she agrees.  She is now back with a married man and she realizes this could end up in a negative way as well but she claims it is "better than nothing."  She states that her mood is starting to get better but she still having a lot of conflicts with her teenage daughter.  She is trying to get a referral for counseling through her insurance company. ?Visit Diagnosis:  ?  ICD-10-CM   ?1. Bipolar 1 disorder, depressed, moderate (HCC)  F31.32   ?  ?2. Generalized anxiety disorder  F41.1   ?  ? ? ?Past Psychiatric History: Long-term outpatient treatment ? ?Past Medical History:  ?Past Medical History:  ?Diagnosis Date  ? Angio-edema   ? Anxiety   ? Bipolar disorder ([place])   ? Depression   ? Headache(784.0)   ? Irritable bowel   ? Urticaria   ?  ?Past Surgical History:  ?Procedure Laterality Date  ? FOOT SURGERY    ? ? ?Family Psychiatric History: See below ? ?Family History:  ?Family History  ?Problem Relation Age of Onset  ? Bipolar disorder Mother   ? Depression Maternal Aunt   ? Depression Maternal Uncle   ? Urticaria Neg Hx   ? Immunodeficiency Neg Hx   ? Eczema Neg Hx   ? Atopy Neg Hx   ? Asthma  Neg Hx   ? Angioedema Neg Hx   ? Allergic rhinitis Neg Hx   ? ? ?Social History:  ?Social History  ? ?Socioeconomic History  ? Marital status: Divorced  ?  Spouse name: Not on file  ? Number of children: Not on file  ? Years of education: Not on file  ? Highest education level: Not on file  ?Occupational History  ? Not on file  ?Tobacco Use  ? Smoking status: Never  ? Smokeless tobacco: Never  ?Vaping Use  ? Vaping Use: Never used  ?Substance and Sexual Activity  ? Alcohol use: Yes  ?  Comment: Rare glass of wine  ? Drug use: Yes  ?  Types: Marijuana  ?  Comment: smokes cannabis for anxiety   ? Sexual activity: Not Currently  ?  Partners: Male  ?  Birth control/protection: Pill  ?Other Topics Concern  ? Not on file  ?Social History Narrative  ? Not on file   ? ?Social Determinants of Health  ? ?Financial Resource Strain: Not on file  ?Food Insecurity: Not on file  ?Transportation Needs: Not on file  ?Physical Activity: Not on file  ?Stress: Not on file  ?Social Connections: Not on file  ? ? ?Allergies:  ?Allergies  ?Allergen Reactions  ? Clonazepam Nausea And Vomiting  ? Sulfa Antibiotics   ? Vilazodone   ?  Per Pt med made her sick  ? ? ?Metabolic Disorder Labs: ?No results found for: HGBA1C, MPG ?No results found for: PROLACTIN ?No results found for: CHOL, TRIG, HDL, CHOLHDL, VLDL, LDLCALC ?No results found for: TSH ? ?Therapeutic Level Labs: ?No results found for: LITHIUM ?Lab Results  ?Component Value Date  ? VALPROATE 33.8 (L) 03/13/2013  ? ?No components found for:  CBMZ ? ?Current Medications: ?Current Outpatient Medications  ?Medication Sig Dispense Refill  ? OLANZapine (ZYPREXA) 10 MG tablet Take 1 tablet (10 mg total) by mouth at bedtime. 30 tablet 2  ? ALPRAZolam (XANAX) 1 MG tablet Take 1 tablet (1 mg total) by mouth 4 (four) times daily as needed. 120 tablet 2  ? EPINEPHrine 0.3 mg/0.3 mL IJ SOAJ injection INJECT 0.3 MLS (0.3 MG TOTAL) INTO THE MUSCLE ONCE FOR 1 DOSE.    ? famotidine (PEPCID) 20 MG tablet Take 20 mg by mouth every evening.     ? metoprolol succinate (TOPROL-XL) 25 MG 24 hr tablet     ? pantoprazole (PROTONIX) 40 MG tablet TAKE 1 TABLET (40 MG TOTAL) BY MOUTH 2 (TWO) TIMES DAILY BEFORE A MEAL. 180 tablet 1  ? rosuvastatin (CRESTOR) 5 MG tablet Take 1 tablet by mouth daily.    ? ?No current facility-administered medications for this visit.  ? ? ? ?Musculoskeletal: ?Strength & Muscle Tone: within normal limits ?Gait & Station: normal ?Patient leans: N/A ? ?Psychiatric Specialty Exam: ?Review of Systems  ?Constitutional:  Positive for fatigue and unexpected weight change.  ?Psychiatric/Behavioral:  Positive for dysphoric mood.   ?All other systems reviewed and are negative.  ?There were no vitals taken for this visit.There is no height or  weight on file to calculate BMI.  ?General Appearance: Casual and Fairly Groomed  ?Eye Contact:  Good  ?Speech:  Clear and Coherent  ?Volume:  Normal  ?Mood:  Dysphoric  ?Affect:  Appropriate and Congruent  ?Thought Process:  Goal Directed  ?Orientation:  Full (Time, Place, and Person)  ?Thought Content: WDL   ?Suicidal Thoughts:  No  ?Homicidal Thoughts:  No  ?Memory:  Immediate;  Good ?Recent;   Good ?Remote;   Good  ?Judgement:  Fair  ?Insight:  Fair  ?Psychomotor Activity:  Decreased  ?Concentration:  Concentration: Good and Attention Span: Good  ?Recall:  Good  ?Fund of Knowledge: Good  ?Language: Good  ?Akathisia:  No  ?Handed:  Right  ?AIMS (if indicated): not done  ?Assets:  Communication Skills ?Desire for Improvement ?Resilience ?Social Support ?Talents/Skills  ?ADL's:  Intact  ?Cognition: WNL  ?Sleep:  Good  ? ?Screenings: ?MDI   ? ?[place] Office Visit from 11/22/2015 in [facility]  ?Total Score (max 50) 38  ? ?  ? ?PHQ2-9   ? ?Flowsheet Row Video Visit from 09/12/2021 in [facility] Video Visit from 08/14/2021 in [facility] Video Visit from 07/10/2021 in [facility] Video Visit from 06/12/2021 in [facility] Video Visit from 05/18/2021 in [facility]  ?PHQ-2 Total Score '4 1 1 1 '$ 0  ?PHQ-9 Total Score 11 -- -- -- --  ? ?  ? ?Flowsheet Row Video Visit from 09/12/2021 in [facility] Video Visit from 08/14/2021 in [facility] Video Visit from 07/10/2021 in [facility]  ?C-SSRS RISK CATEGORY Error: Q3, 4, or 5 should not be populated when Q2 is No No Risk No Risk  ? ?  ? ? ? ?Assessment and Plan: This patient is a 51 year old female  with a history of bipolar disorder and anxiety.  She has made a lot of impulsive decisions recently about relationships.  I think that therapy would be a good idea for her.  We tried adding back the amitriptyline but

## 2021-09-18 DIAGNOSIS — M75101 Unspecified rotator cuff tear or rupture of right shoulder, not specified as traumatic: Secondary | ICD-10-CM | POA: Diagnosis not present

## 2021-09-18 DIAGNOSIS — M7542 Impingement syndrome of left shoulder: Secondary | ICD-10-CM | POA: Diagnosis not present

## 2021-09-22 DIAGNOSIS — H52209 Unspecified astigmatism, unspecified eye: Secondary | ICD-10-CM | POA: Diagnosis not present

## 2021-09-22 DIAGNOSIS — H5203 Hypermetropia, bilateral: Secondary | ICD-10-CM | POA: Diagnosis not present

## 2021-09-22 DIAGNOSIS — H524 Presbyopia: Secondary | ICD-10-CM | POA: Diagnosis not present

## 2021-10-05 ENCOUNTER — Other Ambulatory Visit (HOSPITAL_COMMUNITY): Payer: Self-pay | Admitting: Psychiatry

## 2021-10-09 DIAGNOSIS — E7800 Pure hypercholesterolemia, unspecified: Secondary | ICD-10-CM | POA: Diagnosis not present

## 2021-10-09 DIAGNOSIS — Z1331 Encounter for screening for depression: Secondary | ICD-10-CM | POA: Diagnosis not present

## 2021-10-09 DIAGNOSIS — Z6820 Body mass index (BMI) 20.0-20.9, adult: Secondary | ICD-10-CM | POA: Diagnosis not present

## 2021-10-09 DIAGNOSIS — Z79899 Other long term (current) drug therapy: Secondary | ICD-10-CM | POA: Diagnosis not present

## 2021-10-09 DIAGNOSIS — Z0000 Encounter for general adult medical examination without abnormal findings: Secondary | ICD-10-CM | POA: Diagnosis not present

## 2021-10-09 DIAGNOSIS — Z299 Encounter for prophylactic measures, unspecified: Secondary | ICD-10-CM | POA: Diagnosis not present

## 2021-10-09 DIAGNOSIS — F17210 Nicotine dependence, cigarettes, uncomplicated: Secondary | ICD-10-CM | POA: Diagnosis not present

## 2021-10-09 DIAGNOSIS — Z7189 Other specified counseling: Secondary | ICD-10-CM | POA: Diagnosis not present

## 2021-10-09 DIAGNOSIS — E538 Deficiency of other specified B group vitamins: Secondary | ICD-10-CM | POA: Diagnosis not present

## 2021-10-09 DIAGNOSIS — E559 Vitamin D deficiency, unspecified: Secondary | ICD-10-CM | POA: Diagnosis not present

## 2021-10-09 DIAGNOSIS — R002 Palpitations: Secondary | ICD-10-CM | POA: Diagnosis not present

## 2021-10-09 DIAGNOSIS — Z1339 Encounter for screening examination for other mental health and behavioral disorders: Secondary | ICD-10-CM | POA: Diagnosis not present

## 2021-10-09 DIAGNOSIS — R5383 Other fatigue: Secondary | ICD-10-CM | POA: Diagnosis not present

## 2021-10-11 ENCOUNTER — Telehealth (INDEPENDENT_AMBULATORY_CARE_PROVIDER_SITE_OTHER): Payer: Medicare HMO | Admitting: Psychiatry

## 2021-10-11 ENCOUNTER — Encounter (HOSPITAL_COMMUNITY): Payer: Self-pay | Admitting: Psychiatry

## 2021-10-11 DIAGNOSIS — F411 Generalized anxiety disorder: Secondary | ICD-10-CM

## 2021-10-11 DIAGNOSIS — F3132 Bipolar disorder, current episode depressed, moderate: Secondary | ICD-10-CM

## 2021-10-11 MED ORDER — OLANZAPINE 10 MG PO TABS: ORAL_TABLET | ORAL | 1 refills | Status: DC

## 2021-10-11 MED ORDER — VORTIOXETINE HBR 5 MG PO TABS: 5.0000 mg | ORAL_TABLET | Freq: Every day | ORAL | 2 refills | Status: DC

## 2021-10-11 MED ORDER — DIAZEPAM 10 MG PO TABS: 10.0000 mg | ORAL_TABLET | Freq: Four times a day (QID) | ORAL | 2 refills | Status: DC

## 2021-10-11 NOTE — Progress Notes (Signed)
Virtual Visit via Video Note  I connected with Samantha Chambers on 10/11/21 at  3:20 PM EDT by a video enabled telemedicine application and verified that I am speaking with the correct person using two identifiers.  Location: Patient: home Provider: office   I discussed the limitations of evaluation and management by telemedicine and the availability of in person appointments. The patient expressed understanding and agreed to proceed.     I discussed the assessment and treatment plan with the patient. The patient was provided an opportunity to ask questions and all were answered. The patient agreed with the plan and demonstrated an understanding of the instructions.   The patient was advised to call back or seek an in-person evaluation if the symptoms worsen or if the condition fails to improve as anticipated.  I provided 15 minutes of non-face-to-face time during this encounter.   Samantha Spiller, MD  [facility] MD/PA/NP OP Progress Note  10/11/2021 4:14 PM Samantha Chambers  MRN:  [id number]  Chief Complaint:  Chief Complaint  Patient presents with   Anxiety   Depression   Manic Behavior   Follow-up   HPI: The patient is a 51 year old divorced female who lives with her 6 year old daughter in [place].  She is on disability.  The patient returns for follow-up after 4 weeks.  She still feels quite stressed.  She states that the increase in olanzapine has helped a little bit because she is not getting quite as angry.  She still feels sad and tired.  She has not had much luck with antidepressants as she has tried numerous of them.  Her cardiologist does not want her to be on any more tricyclic's because of her increased heart rate.  She is not eating all that well but has gained a couple of pounds and is up to 113 pounds.  I suggested we try another antidepressant such as Trintellix at a very low dosage and she agrees.  She continues to have constant conflict with her teenage daughter and she is  trying to find a Social worker through her insurance company. Visit Diagnosis:    ICD-10-CM   1. Bipolar 1 disorder, depressed, moderate ([place])  F31.32     2. Generalized anxiety disorder  F41.1       Past Psychiatric History: Long-term outpatient treatment  Past Medical History:  Past Medical History:  Diagnosis Date   Angio-edema    Anxiety    Bipolar disorder ([place])    Depression    Headache(784.0)    Irritable bowel    Urticaria     Past Surgical History:  Procedure Laterality Date   FOOT SURGERY      Family Psychiatric History: See below  Family History:  Family History  Problem Relation Age of Onset   Bipolar disorder Mother    Depression Maternal Aunt    Depression Maternal Uncle    Urticaria Neg Hx    Immunodeficiency Neg Hx    Eczema Neg Hx    Atopy Neg Hx    Asthma Neg Hx    Angioedema Neg Hx    Allergic rhinitis Neg Hx     Social History:  Social History   Socioeconomic History   Marital status: Divorced    Spouse name: Not on file   Number of children: Not on file   Years of education: Not on file   Highest education level: Not on file  Occupational History   Not on file  Tobacco Use   Smoking status:  Never   Smokeless tobacco: Never  Vaping Use   Vaping Use: Never used  Substance and Sexual Activity   Alcohol use: Yes    Comment: Rare glass of wine   Drug use: Yes    Types: Marijuana    Comment: smokes cannabis for anxiety    Sexual activity: Not Currently    Partners: Male    Birth control/protection: Pill  Other Topics Concern   Not on file  Social History Narrative   Not on file   Social Determinants of Health   Financial Resource Strain: Not on file  Food Insecurity: Not on file  Transportation Needs: Not on file  Physical Activity: Not on file  Stress: Not on file  Social Connections: Not on file    Allergies:  Allergies  Allergen Reactions   Clonazepam Nausea And Vomiting   Sulfa Antibiotics    Vilazodone     Per  Pt med made her sick    Metabolic Disorder Labs: No results found for: HGBA1C, MPG No results found for: PROLACTIN No results found for: CHOL, TRIG, HDL, CHOLHDL, VLDL, LDLCALC No results found for: TSH  Therapeutic Level Labs: No results found for: LITHIUM Lab Results  Component Value Date   VALPROATE 33.8 (L) 03/13/2013   No components found for:  CBMZ  Current Medications: Current Outpatient Medications  Medication Sig Dispense Refill   vortioxetine HBr (TRINTELLIX) 5 MG TABS tablet Take 1 tablet (5 mg total) by mouth daily. 30 tablet 2   diazepam (VALIUM) 10 MG tablet Take 1 tablet (10 mg total) by mouth 4 (four) times daily. 120 tablet 2   EPINEPHrine 0.3 mg/0.3 mL IJ SOAJ injection INJECT 0.3 MLS (0.3 MG TOTAL) INTO THE MUSCLE ONCE FOR 1 DOSE.     famotidine (PEPCID) 20 MG tablet Take 20 mg by mouth every evening.      metoprolol succinate (TOPROL-XL) 25 MG 24 hr tablet      OLANZapine (ZYPREXA) 10 MG tablet TAKE 1 TABLET BY MOUTH EVERYDAY AT BEDTIME 90 tablet 1   pantoprazole (PROTONIX) 40 MG tablet TAKE 1 TABLET (40 MG TOTAL) BY MOUTH 2 (TWO) TIMES DAILY BEFORE A MEAL. 180 tablet 1   rosuvastatin (CRESTOR) 5 MG tablet Take 1 tablet by mouth daily.     No current facility-administered medications for this visit.     Musculoskeletal: Strength & Muscle Tone: within normal limits Gait & Station: normal Patient leans: N/A  Psychiatric Specialty Exam: Review of Systems  Psychiatric/Behavioral:  Positive for dysphoric mood.   All other systems reviewed and are negative.  There were no vitals taken for this visit.There is no height or weight on file to calculate BMI.  General Appearance: Casual and Fairly Groomed  Eye Contact:  Good  Speech:  Clear and Coherent  Volume:  Normal  Mood:  Anxious and Depressed  Affect:  Congruent  Thought Process:  Goal Directed  Orientation:  Full (Time, Place, and Person)  Thought Content: Rumination   Suicidal Thoughts:  No   Homicidal Thoughts:  No  Memory:  Immediate;   Good Recent;   Good Remote;   Good  Judgement:  Good  Insight:  Good  Psychomotor Activity:  Decreased  Concentration:  Concentration: Fair and Attention Span: Fair  Recall:  Good  Fund of Knowledge: Good  Language: Good  Akathisia:  No  Handed:  Right  AIMS (if indicated): not done  Assets:  Communication Skills Desire for Improvement Resilience Social Support Talents/Skills  ADL's:  Intact  Cognition: WNL  Sleep:  Good   Screenings: MDI    Flowsheet Row Office Visit from 11/22/2015 in [facility]  Total Score (max 50) 38      PHQ2-9    Flowsheet Row Video Visit from 09/12/2021 in [facility] Video Visit from 08/14/2021 in [facility] Video Visit from 07/10/2021 in [facility] Video Visit from 06/12/2021 in [facility] Video Visit from 05/18/2021 in [facility]  PHQ-2 Total Score '4 1 1 1 '$ 0  PHQ-9 Total Score 11 -- -- -- --      Flowsheet Row Video Visit from 09/12/2021 in [facility] Video Visit from 08/14/2021 in [facility] Video Visit from 07/10/2021 in [facility]  C-SSRS RISK CATEGORY Error: Q3, 4, or 5 should not be populated when Q2 is No No Risk No Risk        Assessment and Plan: This patient is a 51 year old female with a history of bipolar disorder and anxiety.  She is still depressed so we will try Trintellix at 5 mg daily for the depression.  She will continue olanzapine 10 mg at bedtime for mood stabilization.  She thinks the Xanax is making her too drowsy so we will return to Valium 10 mg 4 times daily as needed for anxiety.  She will return  to see me in 4 weeks  Collaboration of Care: Collaboration of Care: Primary Care Provider AEB also be shared with PCP at patient's request  Patient/Guardian was advised Release of Information must be obtained prior to any record release in order to collaborate their care with an outside provider. Patient/Guardian was advised if they have not already done so to contact the registration department to sign all necessary forms in order for [place] to release information regarding their care.   Consent: Patient/Guardian gives verbal consent for treatment and assignment of benefits for services provided during this visit. Patient/Guardian expressed understanding and agreed to proceed.    Samantha Spiller, MD 10/11/2021, 4:14 PM

## 2021-10-16 DIAGNOSIS — E6609 Other obesity due to excess calories: Secondary | ICD-10-CM | POA: Diagnosis not present

## 2021-10-16 DIAGNOSIS — R5382 Chronic fatigue, unspecified: Secondary | ICD-10-CM | POA: Diagnosis not present

## 2021-10-16 DIAGNOSIS — L658 Other specified nonscarring hair loss: Secondary | ICD-10-CM | POA: Diagnosis not present

## 2021-10-16 DIAGNOSIS — E663 Overweight: Secondary | ICD-10-CM | POA: Diagnosis not present

## 2021-10-16 DIAGNOSIS — N926 Irregular menstruation, unspecified: Secondary | ICD-10-CM | POA: Diagnosis not present

## 2021-11-06 ENCOUNTER — Telehealth (INDEPENDENT_AMBULATORY_CARE_PROVIDER_SITE_OTHER): Payer: Medicare HMO | Admitting: Psychiatry

## 2021-11-06 ENCOUNTER — Encounter (HOSPITAL_COMMUNITY): Payer: Self-pay | Admitting: Psychiatry

## 2021-11-06 DIAGNOSIS — F411 Generalized anxiety disorder: Secondary | ICD-10-CM

## 2021-11-06 DIAGNOSIS — F3132 Bipolar disorder, current episode depressed, moderate: Secondary | ICD-10-CM | POA: Diagnosis not present

## 2021-11-06 MED ORDER — OLANZAPINE 10 MG PO TABS: ORAL_TABLET | ORAL | 1 refills | Status: DC

## 2021-11-06 MED ORDER — ALPRAZOLAM 1 MG PO TABS: 1.0000 mg | ORAL_TABLET | Freq: Four times a day (QID) | ORAL | 2 refills | Status: DC | PRN

## 2021-11-06 NOTE — Progress Notes (Signed)
Virtual Visit via Video Note  I connected with Samantha Chambers on 11/06/21 at 10:30 AM EDT by a video enabled telemedicine application and verified that I am speaking with the correct person using two identifiers.  Location: Patient: home Provider: office   I discussed the limitations of evaluation and management by telemedicine and the availability of in person appointments. The patient expressed understanding and agreed to proceed.     I discussed the assessment and treatment plan with the patient. The patient was provided an opportunity to ask questions and all were answered. The patient agreed with the plan and demonstrated an understanding of the instructions.   The patient was advised to call back or seek an in-person evaluation if the symptoms worsen or if the condition fails to improve as anticipated.  I provided 15 minutes of non-face-to-face time during this encounter.   [name], MD  [facility] MD/PA/NP OP Progress Note  11/06/2021 11:08 AM Samantha Chambers  MRN:  [id number]  Chief Complaint:  Chief Complaint  Patient presents with   Anxiety   Depression   Manic Behavior   HPI: The patient is a 51 year old divorced female who lives with her 24 year old daughter in [place].  She is on disability.  The patient returns for follow-up after 4 weeks.  She states that she has been very anxious and had to go back to using the Xanax 1 mg 4 times a day.  The Valium was not helping.  She tried Trintellix but it made her very nauseous and she had to stop it.  The cardiologist does not want her to be on any more tricyclic's because of her increased heart rate.  She states that the married man she was seeing had to break off with her because his wife found out.  The other man she is seeing has been on and off with her.  A lot of this is made her stressed and anxious.  She is not eating that well but remains at 113 pounds.  She and her teenage daughter are getting along better.  She does think  the increase in olanzapine has helped her mood and she denies thoughts of self-harm or suicide.  She states that she saw some sort of physician in [place] who is going to give her progesterone and testosterone.  I urged her to talk about this with her gynecologist. Visit Diagnosis:    ICD-10-CM   1. Bipolar 1 disorder, depressed, moderate ([place])  F31.32     2. Generalized anxiety disorder  F41.1       Past Psychiatric History: Long-term outpatient treatment  Past Medical History:  Past Medical History:  Diagnosis Date   Angio-edema    Anxiety    Bipolar disorder ([place])    Depression    Headache(784.0)    Irritable bowel    Urticaria     Past Surgical History:  Procedure Laterality Date   FOOT SURGERY      Family Psychiatric History: See below  Family History:  Family History  Problem Relation Age of Onset   Bipolar disorder Mother    Depression Maternal Aunt    Depression Maternal Uncle    Urticaria Neg Hx    Immunodeficiency Neg Hx    Eczema Neg Hx    Atopy Neg Hx    Asthma Neg Hx    Angioedema Neg Hx    Allergic rhinitis Neg Hx     Social History:  Social History   Socioeconomic History  Marital status: Divorced    Spouse name: Not on file   Number of children: Not on file   Years of education: Not on file   Highest education level: Not on file  Occupational History   Not on file  Tobacco Use   Smoking status: Never   Smokeless tobacco: Never  Vaping Use   Vaping Use: Never used  Substance and Sexual Activity   Alcohol use: Yes    Comment: Rare glass of wine   Drug use: Yes    Types: Marijuana    Comment: smokes cannabis for anxiety    Sexual activity: Not Currently    Partners: Male    Birth control/protection: Pill  Other Topics Concern   Not on file  Social History Narrative   Not on file   Social Determinants of Health   Financial Resource Strain: Not on file  Food Insecurity: Not on file  Transportation Needs: Not on file  Physical  Activity: Not on file  Stress: Not on file  Social Connections: Not on file    Allergies:  Allergies  Allergen Reactions   Clonazepam Nausea And Vomiting   Sulfa Antibiotics    Vilazodone     Per Pt med made her sick    Metabolic Disorder Labs: No results found for: "HGBA1C", "MPG" No results found for: "PROLACTIN" No results found for: "CHOL", "TRIG", "HDL", "CHOLHDL", "VLDL", "LDLCALC" No results found for: "TSH"  Therapeutic Level Labs: No results found for: "LITHIUM" Lab Results  Component Value Date   VALPROATE 33.8 (L) 03/13/2013   No results found for: "CBMZ"  Current Medications: Current Outpatient Medications  Medication Sig Dispense Refill   ALPRAZolam (XANAX) 1 MG tablet Take 1 tablet (1 mg total) by mouth 4 (four) times daily as needed. 120 tablet 2   EPINEPHrine 0.3 mg/0.3 mL IJ SOAJ injection INJECT 0.3 MLS (0.3 MG TOTAL) INTO THE MUSCLE ONCE FOR 1 DOSE.     famotidine (PEPCID) 20 MG tablet Take 20 mg by mouth every evening.      metoprolol succinate (TOPROL-XL) 25 MG 24 hr tablet      OLANZapine (ZYPREXA) 10 MG tablet TAKE 1 TABLET BY MOUTH EVERYDAY AT BEDTIME 90 tablet 1   pantoprazole (PROTONIX) 40 MG tablet TAKE 1 TABLET (40 MG TOTAL) BY MOUTH 2 (TWO) TIMES DAILY BEFORE A MEAL. 180 tablet 1   rosuvastatin (CRESTOR) 5 MG tablet Take 1 tablet by mouth daily.     No current facility-administered medications for this visit.     Musculoskeletal: Strength & Muscle Tone: within normal limits Gait & Station: normal Patient leans: N/A  Psychiatric Specialty Exam: Review of Systems  Musculoskeletal:  Positive for arthralgias.  Psychiatric/Behavioral:  The patient is nervous/anxious.   All other systems reviewed and are negative.   There were no vitals taken for this visit.There is no height or weight on file to calculate BMI.  General Appearance: Casual and Fairly Groomed  Eye Contact:  Good  Speech:  Clear and Coherent  Volume:  Normal  Mood:   Anxious  Affect:  Appropriate and Congruent  Thought Process:  Goal Directed  Orientation:  Full (Time, Place, and Person)  Thought Content: Rumination   Suicidal Thoughts:  No  Homicidal Thoughts:  No  Memory:  Immediate;   Good Recent;   Good Remote;   Good  Judgement:  Fair  Insight:  Fair  Psychomotor Activity:  Decreased  Concentration:  Concentration: Good and Attention Span: Good  Recall:  Good  Fund of Knowledge: Good  Language: Good  Akathisia:  No  Handed:  Right  AIMS (if indicated): not done  Assets:  Communication Skills Desire for Improvement Physical Health Resilience Social Support Talents/Skills  ADL's:  Intact  Cognition: WNL  Sleep:  Good   Screenings: MDI    Flowsheet Row Office Visit from 11/22/2015 in [facility]  Total Score (max 50) 38      PHQ2-9    Flowsheet Row Video Visit from 11/06/2021 in [place] Video Visit from 09/12/2021 in [place] Video Visit from 08/14/2021 in [place] [facility] Video Visit from 07/10/2021 in [place] [facility] Video Visit from 06/12/2021 in [place] [facility]  PHQ-2 Total Score '1 4 1 1 1  '$ PHQ-9 Total Score -- 11 -- -- --      Flowsheet Row Video Visit from 11/06/2021 in [place] [facility] Video Visit from 09/12/2021 in [place] [facility] Video Visit from 08/14/2021 in [place] Error: Q3, 4, or 5 should not be populated when Q2 is No Error: Q3, 4, or 5 should not be populated when Q2 is No No Risk        Assessment and Plan: This patient is a 51 year old female with a history of bipolar disorder and anxiety.  She is doing better with depression but  is quite anxious so we will go back to Xanax 1 mg 4 times daily.  She will continue olanzapine 10 mg at bedtime for mood stabilization.  She will return to see me in 2 months Collaboration of Care: Collaboration of Care: Other provider involved in patient's care AEB notes will be shared with PCP at patient's request  Patient/Guardian was advised Release of Information must be obtained prior to any record release in order to collaborate their care with an outside provider. Patient/Guardian was advised if they have not already done so to contact the registration department to sign all necessary forms in order for [place] to release information regarding their care.   Consent: Patient/Guardian gives verbal consent for treatment and assignment of benefits for services provided during this visit. Patient/Guardian expressed understanding and agreed to proceed.    [name], MD 11/06/2021, 11:08 AM

## 2021-11-13 DIAGNOSIS — M7542 Impingement syndrome of left shoulder: Secondary | ICD-10-CM | POA: Diagnosis not present

## 2021-11-13 DIAGNOSIS — M25512 Pain in left shoulder: Secondary | ICD-10-CM | POA: Diagnosis not present

## 2021-11-13 DIAGNOSIS — R29898 Other symptoms and signs involving the musculoskeletal system: Secondary | ICD-10-CM | POA: Diagnosis not present

## 2021-11-13 DIAGNOSIS — M25612 Stiffness of left shoulder, not elsewhere classified: Secondary | ICD-10-CM | POA: Diagnosis not present

## 2021-11-21 DIAGNOSIS — M25612 Stiffness of left shoulder, not elsewhere classified: Secondary | ICD-10-CM | POA: Diagnosis not present

## 2021-11-21 DIAGNOSIS — M25512 Pain in left shoulder: Secondary | ICD-10-CM | POA: Diagnosis not present

## 2021-11-21 DIAGNOSIS — M7542 Impingement syndrome of left shoulder: Secondary | ICD-10-CM | POA: Diagnosis not present

## 2021-11-21 DIAGNOSIS — R29898 Other symptoms and signs involving the musculoskeletal system: Secondary | ICD-10-CM | POA: Diagnosis not present

## 2021-11-24 DIAGNOSIS — N951 Menopausal and female climacteric states: Secondary | ICD-10-CM | POA: Diagnosis not present

## 2021-12-13 DIAGNOSIS — K0889 Other specified disorders of teeth and supporting structures: Secondary | ICD-10-CM | POA: Diagnosis not present

## 2021-12-13 DIAGNOSIS — R29898 Other symptoms and signs involving the musculoskeletal system: Secondary | ICD-10-CM | POA: Diagnosis not present

## 2021-12-13 DIAGNOSIS — M7542 Impingement syndrome of left shoulder: Secondary | ICD-10-CM | POA: Diagnosis not present

## 2021-12-13 DIAGNOSIS — M25612 Stiffness of left shoulder, not elsewhere classified: Secondary | ICD-10-CM | POA: Diagnosis not present

## 2021-12-13 DIAGNOSIS — I471 Supraventricular tachycardia: Secondary | ICD-10-CM | POA: Diagnosis not present

## 2021-12-13 DIAGNOSIS — Z299 Encounter for prophylactic measures, unspecified: Secondary | ICD-10-CM | POA: Diagnosis not present

## 2021-12-13 DIAGNOSIS — Z681 Body mass index (BMI) 19 or less, adult: Secondary | ICD-10-CM | POA: Diagnosis not present

## 2021-12-13 DIAGNOSIS — M25512 Pain in left shoulder: Secondary | ICD-10-CM | POA: Diagnosis not present

## 2021-12-13 DIAGNOSIS — M545 Low back pain, unspecified: Secondary | ICD-10-CM | POA: Diagnosis not present

## 2021-12-13 DIAGNOSIS — F319 Bipolar disorder, unspecified: Secondary | ICD-10-CM | POA: Diagnosis not present

## 2021-12-13 DIAGNOSIS — F17210 Nicotine dependence, cigarettes, uncomplicated: Secondary | ICD-10-CM | POA: Diagnosis not present

## 2021-12-14 DIAGNOSIS — M549 Dorsalgia, unspecified: Secondary | ICD-10-CM | POA: Diagnosis not present

## 2021-12-26 DIAGNOSIS — M25512 Pain in left shoulder: Secondary | ICD-10-CM | POA: Diagnosis not present

## 2021-12-26 DIAGNOSIS — M7542 Impingement syndrome of left shoulder: Secondary | ICD-10-CM | POA: Diagnosis not present

## 2021-12-26 DIAGNOSIS — R29898 Other symptoms and signs involving the musculoskeletal system: Secondary | ICD-10-CM | POA: Diagnosis not present

## 2021-12-26 DIAGNOSIS — M25612 Stiffness of left shoulder, not elsewhere classified: Secondary | ICD-10-CM | POA: Diagnosis not present

## 2022-01-03 DIAGNOSIS — M7542 Impingement syndrome of left shoulder: Secondary | ICD-10-CM | POA: Diagnosis not present

## 2022-01-03 DIAGNOSIS — M25512 Pain in left shoulder: Secondary | ICD-10-CM | POA: Diagnosis not present

## 2022-01-03 DIAGNOSIS — M25612 Stiffness of left shoulder, not elsewhere classified: Secondary | ICD-10-CM | POA: Diagnosis not present

## 2022-01-03 DIAGNOSIS — R29898 Other symptoms and signs involving the musculoskeletal system: Secondary | ICD-10-CM | POA: Diagnosis not present

## 2022-01-04 ENCOUNTER — Encounter (HOSPITAL_COMMUNITY): Payer: Self-pay | Admitting: Psychiatry

## 2022-01-04 ENCOUNTER — Telehealth (INDEPENDENT_AMBULATORY_CARE_PROVIDER_SITE_OTHER): Payer: Medicare HMO | Admitting: Psychiatry

## 2022-01-04 ENCOUNTER — Telehealth (HOSPITAL_COMMUNITY): Payer: Self-pay | Admitting: Psychiatry

## 2022-01-04 DIAGNOSIS — F411 Generalized anxiety disorder: Secondary | ICD-10-CM | POA: Diagnosis not present

## 2022-01-04 DIAGNOSIS — F3132 Bipolar disorder, current episode depressed, moderate: Secondary | ICD-10-CM

## 2022-01-04 DIAGNOSIS — F900 Attention-deficit hyperactivity disorder, predominantly inattentive type: Secondary | ICD-10-CM

## 2022-01-04 MED ORDER — AMITRIPTYLINE HCL 25 MG PO TABS: ORAL_TABLET | ORAL | 1 refills | Status: DC

## 2022-01-04 MED ORDER — ALPRAZOLAM 1 MG PO TABS: 1.0000 mg | ORAL_TABLET | Freq: Four times a day (QID) | ORAL | 2 refills | Status: DC | PRN

## 2022-01-04 MED ORDER — OLANZAPINE 5 MG PO TABS: 5.0000 mg | ORAL_TABLET | Freq: Every day | ORAL | 2 refills | Status: DC

## 2022-01-04 NOTE — Telephone Encounter (Signed)
D:  Dr. Harrington Challenger referred pt to [place].  A:  Placed call to orient pt.  Pt states she would need to verify her benefits first because of a financial strain she is having.  "The end of the year is a tough time financially for me because of property taxes, etc."  Informed pt that she wouldn't have to pay anything until after she completes the program.  Encouraged pt to contact the case manager after she calls her insurance company.  Inform Dr. Harrington Challenger.  R:  Pt receptive.

## 2022-01-04 NOTE — Progress Notes (Signed)
Virtual Visit via Video Note  I connected with Samantha Chambers on 01/04/22 at  2:40 PM EDT by a video enabled telemedicine application and verified that I am speaking with the correct person using two identifiers.  Location: Patient: home Provider: office   I discussed the limitations of evaluation and management by telemedicine and the availability of in person appointments. The patient expressed understanding and agreed to proceed.     I discussed the assessment and treatment plan with the patient. The patient was provided an opportunity to ask questions and all were answered. The patient agreed with the plan and demonstrated an understanding of the instructions.   The patient was advised to call back or seek an in-person evaluation if the symptoms worsen or if the condition fails to improve as anticipated.  I provided 25 minutes of non-face-to-face time during this encounter.   [name], MD  [facility] MD/PA/NP OP Progress Note  01/04/2022 2:52 PM Samantha Chambers  MRN:  [id number]  Chief Complaint:  Chief Complaint  Patient presents with   Depression   Anxiety   Manic Behavior   HPI: The patient is a 51 year old divorced female who lives with her 60 year old daughter in [place].  She is on disability.  The patient returns for follow-up after 2 months.  She states she has not been doing well over the intervening time.  She broke up with both of her boyfriends.  One was married and got caught seeing her and went back to his wife.  The other 1 is pretty much stopped contacting her.  This is made her very depressed.  She states on top of this she is having a lot of financial issues and problems with her teenage daughter.  She states that her appetite is poor and she is lost down to 107 pounds.  Her mood is low and sad.  She is unmotivated and energetic.  She has had passive suicidal thoughts but no plan.  She has been on lots of different antidepressants and amitriptyline helped her the  most.  The cardiologist did not like her being on it because of the tachycardia but she does not remember it being any worse with or without it.  I told her we could cautiously try a low dose again.  I also strongly suggested therapy.  Given that with the severity of symptoms I suggested intensive outpatient treatment and she is amenable to this Visit Diagnosis:    ICD-10-CM   1. Bipolar 1 disorder, depressed, moderate ([place])  F31.32     2. Generalized anxiety disorder  F41.1     3. Attention deficit hyperactivity disorder (ADHD), predominantly inattentive type  F90.0       Past Psychiatric History: Long-term outpatient treatment  Past Medical History:  Past Medical History:  Diagnosis Date   Angio-edema    Anxiety    Bipolar disorder ([place])    Depression    Headache(784.0)    Irritable bowel    Urticaria     Past Surgical History:  Procedure Laterality Date   FOOT SURGERY      Family Psychiatric History: see below  Family History:  Family History  Problem Relation Age of Onset   Bipolar disorder Mother    Depression Maternal Aunt    Depression Maternal Uncle    Urticaria Neg Hx    Immunodeficiency Neg Hx    Eczema Neg Hx    Atopy Neg Hx    Asthma Neg Hx    Angioedema  Neg Hx    Allergic rhinitis Neg Hx     Social History:  Social History   Socioeconomic History   Marital status: Divorced    Spouse name: Not on file   Number of children: Not on file   Years of education: Not on file   Highest education level: Not on file  Occupational History   Not on file  Tobacco Use   Smoking status: Never   Smokeless tobacco: Never  Vaping Use   Vaping Use: Never used  Substance and Sexual Activity   Alcohol use: Yes    Comment: Rare glass of wine   Drug use: Yes    Types: Marijuana    Comment: smokes cannabis for anxiety    Sexual activity: Not Currently    Partners: Male    Birth control/protection: Pill  Other Topics Concern   Not on file  Social History  Narrative   Not on file   Social Determinants of Health   Financial Resource Strain: Not on file  Food Insecurity: Not on file  Transportation Needs: Not on file  Physical Activity: Not on file  Stress: Not on file  Social Connections: Not on file    Allergies:  Allergies  Allergen Reactions   Clonazepam Nausea And Vomiting   Sulfa Antibiotics    Vilazodone     Per Pt med made her sick    Metabolic Disorder Labs: No results found for: "HGBA1C", "MPG" No results found for: "PROLACTIN" No results found for: "CHOL", "TRIG", "HDL", "CHOLHDL", "VLDL", "LDLCALC" No results found for: "TSH"  Therapeutic Level Labs: No results found for: "LITHIUM" Lab Results  Component Value Date   VALPROATE 33.8 (L) 03/13/2013   No results found for: "CBMZ"  Current Medications: Current Outpatient Medications  Medication Sig Dispense Refill   OLANZapine (ZYPREXA) 5 MG tablet Take 1 tablet (5 mg total) by mouth at bedtime. 30 tablet 2   ALPRAZolam (XANAX) 1 MG tablet Take 1 tablet (1 mg total) by mouth 4 (four) times daily as needed. 120 tablet 2   amitriptyline (ELAVIL) 25 MG tablet TAKE 1 TABLET BY MOUTH EVERYDAY AT BEDTIME 90 tablet 1   EPINEPHrine 0.3 mg/0.3 mL IJ SOAJ injection INJECT 0.3 MLS (0.3 MG TOTAL) INTO THE MUSCLE ONCE FOR 1 DOSE.     famotidine (PEPCID) 20 MG tablet Take 20 mg by mouth every evening.      metoprolol succinate (TOPROL-XL) 25 MG 24 hr tablet      pantoprazole (PROTONIX) 40 MG tablet TAKE 1 TABLET (40 MG TOTAL) BY MOUTH 2 (TWO) TIMES DAILY BEFORE A MEAL. 180 tablet 1   rosuvastatin (CRESTOR) 5 MG tablet Take 1 tablet by mouth daily.     No current facility-administered medications for this visit.     Musculoskeletal: Strength & Muscle Tone: within normal limits Gait & Station: normal Patient leans: N/A  Psychiatric Specialty Exam: Review of Systems  Constitutional:  Positive for appetite change and unexpected weight change.  Psychiatric/Behavioral:   Positive for dysphoric mood. The patient is nervous/anxious.   All other systems reviewed and are negative.   There were no vitals taken for this visit.There is no height or weight on file to calculate BMI.  General Appearance: Casual and Fairly Groomed  Eye Contact:  Good  Speech:  Clear and Coherent  Volume:  Normal  Mood:  Depressed and Worthless  Affect:  Depressed  Thought Process:  Goal Directed  Orientation:  Full (Time, Place, and Person)  Thought  Content: Rumination   Suicidal Thoughts:  Yes.  without intent/plan  Homicidal Thoughts:  No  Memory:  Immediate;   Good Recent;   Good Remote;   Good  Judgement:  Good  Insight:  Fair  Psychomotor Activity:  Decreased  Concentration:  Concentration: Good and Attention Span: Good  Recall:  Good  Fund of Knowledge: Good  Language: Good  Akathisia:  No  Handed:  Right  AIMS (if indicated): not done  Assets:  Communication Skills Desire for Improvement Resilience Social Support Talents/Skills  ADL's:  Intact  Cognition: WNL  Sleep:  Good   Screenings: MDI    Flowsheet Row Office Visit from 11/22/2015 in [place] [facility]  Total Score (max 50) 38      PHQ2-9    Flowsheet Row Video Visit from 01/04/2022 in [place] Video Visit from 11/06/2021 in [place] Video Visit from 09/12/2021 in [facility] [facility] Video Visit from 08/14/2021 in [place] [facility] Video Visit from 07/10/2021 in [place] [facility]  PHQ-2 Total Score '5 1 4 1 1  '$ PHQ-9 Total Score 10 -- 11 -- --      Flowsheet Row Video Visit from 01/04/2022 in [facility] [facility] Video Visit from 11/06/2021 in [facility] [facility] Video Visit from 09/12/2021 in [facility] [place] Error: Q3, 4, or 5 should not be populated when Q2 is No Error: Q3, 4, or 5 should not be populated when Q2 is No Error: Q3, 4, or 5 should not be populated when Q2 is No        Assessment and Plan: Patient is a 51 year old female with a history of bipolar disorder and anxiety.  She is much more depressed right now probably due to the recent break-up's.  Increasing olanzapine has not helped and she states it is diminished her sexual drive so we will go back to 5 mg for mood stabilization.  We will retry amitriptyline 25 mg at bedtime for depression.  She will continue Xanax 1 mg 4 times daily for anxiety.  She will return to see me in 3 weeks  Collaboration of Care: Collaboration of Care: Referral or follow-up with counselor/therapist AEB patient will be referred for intensive outpatient treatment  Patient/Guardian was advised Release of Information must be obtained prior to any record release in order to collaborate their care with an outside provider. Patient/Guardian was advised if they have not already done so to contact the registration department to sign all necessary forms in order for [place] to release information regarding their care.   Consent: Patient/Guardian gives verbal consent for treatment and assignment of benefits for services provided during this visit. Patient/Guardian expressed understanding and agreed to proceed.    [name], MD 01/04/2022, 2:52 PM

## 2022-01-10 ENCOUNTER — Telehealth (HOSPITAL_COMMUNITY): Payer: Self-pay | Admitting: Psychiatry

## 2022-01-10 NOTE — Telephone Encounter (Signed)
D:  Pt called to inform the case manager that she verified her benefits with her insurance company.  "[employer] will cover at 80%, but I am responsible for the 20%.  I need assistance with that amount.  Do you all provide assistance for the 20%?"  A:  Provided pt with support and discussed options.  Informed pt that she could complete an application for assistance and see if she qualifies for any assistance.  Also, mentioned [employer].  Provided pt with that phone number.  Case manager will put an application in the mail for pt, per her request.  Encouraged pt to give the case manager a call once she completes the application and turn it in.  Reiterated that pt could walk in at [facility] for an assessment if needed.  R:  Pt receptive.

## 2022-01-24 ENCOUNTER — Telehealth (HOSPITAL_COMMUNITY): Payer: Self-pay | Admitting: Psychiatry

## 2022-01-24 NOTE — Telephone Encounter (Signed)
D:  Followed up with patient re: MH-IOP.  According to pt, she scheduled an appointment with [facility].  Pt states she can't afford to pay the 20% after her insurance covers the 80% for MH-IOP.  "I need assistance with that 20%."  Inquired if pt had received the application for assistance in the mail; pt states she never received it.  States she saw it online but wasn't able to print it off.  A:  Provided pt with support.  Encouraged pt to call the case manager if she changes her mind about attending MH-IOP.  R: Pt receptive.

## 2022-01-31 DIAGNOSIS — M25512 Pain in left shoulder: Secondary | ICD-10-CM | POA: Diagnosis not present

## 2022-01-31 DIAGNOSIS — M25612 Stiffness of left shoulder, not elsewhere classified: Secondary | ICD-10-CM | POA: Diagnosis not present

## 2022-01-31 DIAGNOSIS — R29898 Other symptoms and signs involving the musculoskeletal system: Secondary | ICD-10-CM | POA: Diagnosis not present

## 2022-01-31 DIAGNOSIS — M7542 Impingement syndrome of left shoulder: Secondary | ICD-10-CM | POA: Diagnosis not present

## 2022-02-01 ENCOUNTER — Other Ambulatory Visit (HOSPITAL_COMMUNITY): Payer: Medicare HMO | Attending: Psychiatry | Admitting: Psychiatry

## 2022-02-01 ENCOUNTER — Ambulatory Visit (HOSPITAL_COMMUNITY): Payer: Medicare HMO | Admitting: Psychiatry

## 2022-02-01 ENCOUNTER — Telehealth (HOSPITAL_COMMUNITY): Payer: Self-pay | Admitting: Psychiatry
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
## 2022-02-05 ENCOUNTER — Telehealth (INDEPENDENT_AMBULATORY_CARE_PROVIDER_SITE_OTHER): Payer: Medicare HMO | Admitting: Psychiatry

## 2022-02-05 ENCOUNTER — Encounter (HOSPITAL_COMMUNITY): Payer: Self-pay | Admitting: Psychiatry

## 2022-02-05 DIAGNOSIS — F411 Generalized anxiety disorder: Secondary | ICD-10-CM | POA: Diagnosis not present

## 2022-02-05 DIAGNOSIS — F3132 Bipolar disorder, current episode depressed, moderate: Secondary | ICD-10-CM | POA: Diagnosis not present

## 2022-02-05 MED ORDER — OLANZAPINE 5 MG PO TABS: 5.0000 mg | ORAL_TABLET | Freq: Every day | ORAL | 2 refills | Status: DC

## 2022-02-05 MED ORDER — AMITRIPTYLINE HCL 25 MG PO TABS: 75.0000 mg | ORAL_TABLET | Freq: Every day | ORAL | 1 refills | Status: DC

## 2022-02-05 NOTE — Progress Notes (Signed)
Virtual Visit via Video Note  I connected with Samantha Chambers on 02/05/22 at  1:20 PM EDT by a video enabled telemedicine application and verified that I am speaking with the correct person using two identifiers.  Location: Patient: home Provider: office   I discussed the limitations of evaluation and management by telemedicine and the availability of in person appointments. The patient expressed understanding and agreed to proceed.     I discussed the assessment and treatment plan with the patient. The patient was provided an opportunity to ask questions and all were answered. The patient agreed with the plan and demonstrated an understanding of the instructions.   The patient was advised to call back or seek an in-person evaluation if the symptoms worsen or if the condition fails to improve as anticipated.  I provided 20 minutes of non-face-to-face time during this encounter.   [name], MD  [facility] MD/PA/NP OP Progress Note  02/05/2022 1:47 PM Samantha Chambers  MRN:  [id number]  Chief Complaint:  Chief Complaint  Patient presents with   Depression   Anxiety   Follow-up   HPI: This patient is a 51 year old divorced female who lives with her teenage daughter in [place].  She is on disability.  The patient returns for follow-up after 4 weeks.  She still is not doing that well.  She still grieving the loss of both of her boyfriends.  One of them provides physical therapy services for her and she still sees him.  Furthermore he is offered to pay for any therapy that she needs because she is not doing well.  On top of that she is having a lot of issues with her teenage daughter who is gotten into legal problems.  I had referred the patient for intensive outpatient therapy she is set up to start this week and she is having second thoughts about it because it involves a lot of groups.  I urged her to try it.  She is also trying to start individual therapy.  She still not eating much her  sleep is variable her energy is very low.  She is having a lot of crying spells.  She states at 1 point she felt suicidal but denies this now and does not have any plans to do so because of her daughter.  She has increased the amitriptyline to 50 mg which she thinks is helping a little and will continue to go up on this.  So far she is not having any palpitations or other cardiac problems. Visit Diagnosis:    ICD-10-CM   1. Bipolar 1 disorder, depressed, moderate ([place])  F31.32     2. Generalized anxiety disorder  F41.1       Past Psychiatric History: Long-term outpatient treatment  Past Medical History:  Past Medical History:  Diagnosis Date   Angio-edema    Anxiety    Bipolar disorder ([place])    Depression    Headache(784.0)    Irritable bowel    Urticaria     Past Surgical History:  Procedure Laterality Date   FOOT SURGERY      Family Psychiatric History: See below  Family History:  Family History  Problem Relation Age of Onset   Bipolar disorder Mother    Depression Maternal Aunt    Depression Maternal Uncle    Urticaria Neg Hx    Immunodeficiency Neg Hx    Eczema Neg Hx    Atopy Neg Hx    Asthma Neg Hx  Angioedema Neg Hx    Allergic rhinitis Neg Hx     Social History:  Social History   Socioeconomic History   Marital status: Divorced    Spouse name: Not on file   Number of children: Not on file   Years of education: Not on file   Highest education level: Not on file  Occupational History   Not on file  Tobacco Use   Smoking status: Never   Smokeless tobacco: Never  Vaping Use   Vaping Use: Never used  Substance and Sexual Activity   Alcohol use: Yes    Comment: Rare glass of wine   Drug use: Yes    Types: Marijuana    Comment: smokes cannabis for anxiety    Sexual activity: Not Currently    Partners: Male    Birth control/protection: Pill  Other Topics Concern   Not on file  Social History Narrative   Not on file   Social Determinants of  Health   Financial Resource Strain: Not on file  Food Insecurity: Not on file  Transportation Needs: Not on file  Physical Activity: Not on file  Stress: Not on file  Social Connections: Not on file    Allergies:  Allergies  Allergen Reactions   Clonazepam Nausea And Vomiting   Sulfa Antibiotics    Vilazodone     Per Pt med made her sick    Metabolic Disorder Labs: No results found for: "HGBA1C", "MPG" No results found for: "PROLACTIN" No results found for: "CHOL", "TRIG", "HDL", "CHOLHDL", "VLDL", "LDLCALC" No results found for: "TSH"  Therapeutic Level Labs: No results found for: "LITHIUM" Lab Results  Component Value Date   VALPROATE 33.8 (L) 03/13/2013   No results found for: "CBMZ"  Current Medications: Current Outpatient Medications  Medication Sig Dispense Refill   ALPRAZolam (XANAX) 1 MG tablet Take 1 tablet (1 mg total) by mouth 4 (four) times daily as needed. 120 tablet 2   amitriptyline (ELAVIL) 25 MG tablet Take 3 tablets (75 mg total) by mouth at bedtime. 90 tablet 1   EPINEPHrine 0.3 mg/0.3 mL IJ SOAJ injection INJECT 0.3 MLS (0.3 MG TOTAL) INTO THE MUSCLE ONCE FOR 1 DOSE.     famotidine (PEPCID) 20 MG tablet Take 20 mg by mouth every evening.      metoprolol succinate (TOPROL-XL) 25 MG 24 hr tablet      OLANZapine (ZYPREXA) 5 MG tablet Take 1 tablet (5 mg total) by mouth at bedtime. 30 tablet 2   pantoprazole (PROTONIX) 40 MG tablet TAKE 1 TABLET (40 MG TOTAL) BY MOUTH 2 (TWO) TIMES DAILY BEFORE A MEAL. 180 tablet 1   rosuvastatin (CRESTOR) 5 MG tablet Take 1 tablet by mouth daily.     No current facility-administered medications for this visit.     Musculoskeletal: Strength & Muscle Tone: within normal limits Gait & Station: normal Patient leans: N/A  Psychiatric Specialty Exam: Review of Systems  Constitutional:  Positive for appetite change.  Psychiatric/Behavioral:  Positive for decreased concentration and dysphoric mood. The patient is  nervous/anxious.   All other systems reviewed and are negative.   There were no vitals taken for this visit.There is no height or weight on file to calculate BMI.  General Appearance: Casual and Fairly Groomed  Eye Contact:  Good  Speech:  Clear and Coherent  Volume:  Normal  Mood:  Depressed  Affect:  Depressed and Flat  Thought Process:  Goal Directed  Orientation:  Full (Time, Place, and Person)  Thought Content: Rumination   Suicidal Thoughts:  No  Homicidal Thoughts:  No  Memory:  Immediate;   Good Recent;   Good Remote;   Good  Judgement:  Good  Insight:  Fair  Psychomotor Activity:  Decreased  Concentration:  Concentration: Poor and Attention Span: Poor  Recall:  Good  Fund of Knowledge: Good  Language: Good  Akathisia:  No  Handed:  Right  AIMS (if indicated): not done  Assets:  Communication Skills Desire for Improvement Physical Health Resilience Social Support Talents/Skills  ADL's:  Intact  Cognition: WNL  Sleep:  Fair   Screenings: MDI    Flowsheet Row Office Visit from 11/22/2015 in [place] [facility]  Total Score (max 50) 38      PHQ2-9    Flowsheet Row Video Visit from 02/05/2022 in [place] [facility] Video Visit from 01/04/2022 in [place] [facility] Video Visit from 11/06/2021 in [place] [facility] Video Visit from 09/12/2021 in [place] [facility] Video Visit from 08/14/2021 in [place] [facility]  PHQ-2 Total Score '4 5 1 4 1  '$ PHQ-9 Total Score 15 10 -- 11 --      Flowsheet Row Video Visit from 02/05/2022 in [place] [facility] Video Visit from 01/04/2022 in [place] [facility] Video Visit from 11/06/2021 in [place] Error: Q3, 4, or 5 should not be populated when Q2 is No Error: Q3, 4, or 5 should not be populated when Q2 is No Error: Q3, 4, or 5 should not be populated when Q2 is No        Assessment and Plan: This patient is a 51 year old female with a history of bipolar disorder and anxiety.  She is primarily upset about the recent break-up's particularly with 57 man who was actually married.  I have urged her to try to keep her distance from him as it will only rekindle old wounds.  She will go up on amitriptyline to 75 mg at bedtime for depression and continue olanzapine 5 mg for mood stabilization.  She will continue Xanax 1 mg 4 times daily for anxiety.  She will return to see me in 3 weeks  Collaboration of Care: Collaboration of Care: Referral or follow-up with counselor/therapist AEB the patient has been referred for intensive outpatient treatment  Patient/Guardian was advised Release of Information must be obtained prior to any record release in order to collaborate their care with an outside provider. Patient/Guardian was advised if they have not already done so to contact the registration department to sign all necessary forms in order for [place] to release information regarding their care.   Consent: Patient/Guardian gives verbal consent for treatment and assignment of benefits for services provided during this visit. Patient/Guardian expressed understanding and agreed to proceed.    [name], MD 02/05/2022, 1:47 PM

## 2022-02-06 ENCOUNTER — Other Ambulatory Visit (HOSPITAL_COMMUNITY): Payer: Medicare HMO | Admitting: Psychiatry

## 2022-02-06 ENCOUNTER — Telehealth (HOSPITAL_COMMUNITY): Payer: Self-pay | Admitting: Psychiatry

## 2022-02-06 DIAGNOSIS — F3132 Bipolar disorder, current episode depressed, moderate: Secondary | ICD-10-CM | POA: Diagnosis not present

## 2022-02-06 DIAGNOSIS — F319 Bipolar disorder, unspecified: Secondary | ICD-10-CM | POA: Diagnosis not present

## 2022-02-06 NOTE — Telephone Encounter (Signed)
D:  Placed call about half way during MH-IOP to check in on pt.  Pt states that the group isn't the right fit for her.  "I would prefer individual group because this isn't going to work for me and my issues.  My issues are totally different and I don't feel comfortable talking about them in a group setting."  Pt states she has an individual counseling appt with a new therapist this afternoon.  Pt denies SI/HI or A/V hallucinations.  A:  Inform Dr. Rosita Kea, Shade Flood, LCSW and Dr. Harrington Challenger.  Encouraged pt to contact the case manager if she decides she would like to try MH-IOP again in the future.  R:  Pt receptive.

## 2022-02-07 ENCOUNTER — Ambulatory Visit (HOSPITAL_COMMUNITY): Payer: Medicare HMO

## 2022-02-07 DIAGNOSIS — Z1283 Encounter for screening for malignant neoplasm of skin: Secondary | ICD-10-CM | POA: Diagnosis not present

## 2022-02-07 DIAGNOSIS — D239 Other benign neoplasm of skin, unspecified: Secondary | ICD-10-CM | POA: Diagnosis not present

## 2022-02-07 DIAGNOSIS — L111 Transient acantholytic dermatosis [Grover]: Secondary | ICD-10-CM | POA: Diagnosis not present

## 2022-02-07 DIAGNOSIS — L570 Actinic keratosis: Secondary | ICD-10-CM | POA: Diagnosis not present

## 2022-02-08 ENCOUNTER — Ambulatory Visit (HOSPITAL_COMMUNITY): Payer: Medicare HMO

## 2022-02-09 ENCOUNTER — Ambulatory Visit (HOSPITAL_COMMUNITY): Payer: Medicare HMO

## 2022-02-12 ENCOUNTER — Ambulatory Visit (HOSPITAL_COMMUNITY): Payer: Medicare HMO

## 2022-02-13 ENCOUNTER — Ambulatory Visit (HOSPITAL_COMMUNITY): Payer: Medicare HMO

## 2022-02-13 DIAGNOSIS — F319 Bipolar disorder, unspecified: Secondary | ICD-10-CM | POA: Diagnosis not present

## 2022-02-14 ENCOUNTER — Ambulatory Visit (HOSPITAL_COMMUNITY): Payer: Medicare HMO

## 2022-02-22 DIAGNOSIS — R0602 Shortness of breath: Secondary | ICD-10-CM | POA: Diagnosis not present

## 2022-02-22 DIAGNOSIS — F319 Bipolar disorder, unspecified: Secondary | ICD-10-CM | POA: Diagnosis not present

## 2022-02-22 DIAGNOSIS — R630 Anorexia: Secondary | ICD-10-CM | POA: Diagnosis not present

## 2022-02-22 DIAGNOSIS — R000 Tachycardia, unspecified: Secondary | ICD-10-CM | POA: Diagnosis not present

## 2022-02-22 DIAGNOSIS — Z87891 Personal history of nicotine dependence: Secondary | ICD-10-CM | POA: Diagnosis not present

## 2022-02-22 DIAGNOSIS — M791 Myalgia, unspecified site: Secondary | ICD-10-CM | POA: Diagnosis not present

## 2022-02-22 DIAGNOSIS — Z299 Encounter for prophylactic measures, unspecified: Secondary | ICD-10-CM | POA: Diagnosis not present

## 2022-02-22 DIAGNOSIS — R55 Syncope and collapse: Secondary | ICD-10-CM | POA: Diagnosis not present

## 2022-02-23 ENCOUNTER — Telehealth (INDEPENDENT_AMBULATORY_CARE_PROVIDER_SITE_OTHER): Payer: Medicare HMO | Admitting: Psychiatry

## 2022-02-23 ENCOUNTER — Encounter (HOSPITAL_COMMUNITY): Payer: Self-pay | Admitting: Psychiatry

## 2022-02-23 DIAGNOSIS — F900 Attention-deficit hyperactivity disorder, predominantly inattentive type: Secondary | ICD-10-CM

## 2022-02-23 DIAGNOSIS — F3132 Bipolar disorder, current episode depressed, moderate: Secondary | ICD-10-CM | POA: Diagnosis not present

## 2022-02-23 DIAGNOSIS — F411 Generalized anxiety disorder: Secondary | ICD-10-CM

## 2022-02-23 MED ORDER — OLANZAPINE 5 MG PO TABS: 5.0000 mg | ORAL_TABLET | Freq: Every day | ORAL | 2 refills | Status: DC

## 2022-02-23 MED ORDER — ALPRAZOLAM 1 MG PO TABS: 1.0000 mg | ORAL_TABLET | Freq: Four times a day (QID) | ORAL | 2 refills | Status: DC | PRN

## 2022-02-23 MED ORDER — MIRTAZAPINE 15 MG PO TABS: 15.0000 mg | ORAL_TABLET | Freq: Every day | ORAL | 2 refills | Status: DC

## 2022-02-23 NOTE — Progress Notes (Signed)
Virtual Visit via Video Note  I connected with Samantha Chambers on 02/23/22 at 11:00 AM EDT by a video enabled telemedicine application and verified that I am speaking with the correct person using two identifiers.  Location: Patient: home Provider: home office   I discussed the limitations of evaluation and management by telemedicine and the availability of in person appointments. The patient expressed understanding and agreed to proceed.     I discussed the assessment and treatment plan with the patient. The patient was provided an opportunity to ask questions and all were answered. The patient agreed with the plan and demonstrated an understanding of the instructions.   The patient was advised to call back or seek an in-person evaluation if the symptoms worsen or if the condition fails to improve as anticipated.  I provided 20 minutes of non-face-to-face time during this encounter.   [name], MD  [facility] MD/PA/NP OP Progress Note  02/23/2022 11:36 AM Samantha Chambers  MRN:  [id number]  Chief Complaint:  Chief Complaint  Patient presents with   Depression   Anxiety   Follow-up   HPI: This patient is a 51 year old divorced female who lives with her teenage daughter in [place].  She is on disability. The patient returns for follow-up after 3 weeks.  She seems a little better today in terms of depression but claims she still not feeling that great.  She is not eating well and has no appetite and has lost down to 100 pounds.  She has started therapy with an online therapist and thinks this is going well.  She does not have any thoughts of self-harm or suicide.  Her primary provider put her on mirtazapine for weight loss at only 7.5 mg I explained that this is a similar drug to amitriptyline and we cannot be on 2 different antidepressants at bedtime.  She states the amitriptyline tends to cause tachycardia so we will discontinue this and increase the dosage of mirtazapine to 15 mg.  For  now she will continue the olanzapine as well as the Xanax.  She is still seeing one of the men that she was dating before and he is obviously not very honest to see was caught cheating with another woman nevertheless she claims she may still see him for a while.  I urged her to not connect with people who seem to diminish her self-esteem.  Visit Diagnosis:    ICD-10-CM   1. Bipolar 1 disorder, depressed, moderate ([place])  F31.32     2. Generalized anxiety disorder  F41.1     3. Attention deficit hyperactivity disorder (ADHD), predominantly inattentive type  F90.0       Past Psychiatric History: Long-term outpatient treatment  Past Medical History:  Past Medical History:  Diagnosis Date   Angio-edema    Anxiety    Bipolar disorder ([place])    Depression    Headache(784.0)    Irritable bowel    Urticaria     Past Surgical History:  Procedure Laterality Date   FOOT SURGERY      Family Psychiatric History: see below  Family History:  Family History  Problem Relation Age of Onset   Bipolar disorder Mother    Depression Maternal Aunt    Depression Maternal Uncle    Urticaria Neg Hx    Immunodeficiency Neg Hx    Eczema Neg Hx    Atopy Neg Hx    Asthma Neg Hx    Angioedema Neg Hx    Allergic  rhinitis Neg Hx     Social History:  Social History   Socioeconomic History   Marital status: Divorced    Spouse name: Not on file   Number of children: Not on file   Years of education: Not on file   Highest education level: Not on file  Occupational History   Not on file  Tobacco Use   Smoking status: Never   Smokeless tobacco: Never  Vaping Use   Vaping Use: Never used  Substance and Sexual Activity   Alcohol use: Yes    Comment: Rare glass of wine   Drug use: Yes    Types: Marijuana    Comment: smokes cannabis for anxiety    Sexual activity: Not Currently    Partners: Male    Birth control/protection: Pill  Other Topics Concern   Not on file  Social History  Narrative   Not on file   Social Determinants of Health   Financial Resource Strain: Not on file  Food Insecurity: Not on file  Transportation Needs: Not on file  Physical Activity: Not on file  Stress: Not on file  Social Connections: Not on file    Allergies:  Allergies  Allergen Reactions   Clonazepam Nausea And Vomiting   Sulfa Antibiotics    Vilazodone     Per Pt med made her sick    Metabolic Disorder Labs: No results found for: "HGBA1C", "MPG" No results found for: "PROLACTIN" No results found for: "CHOL", "TRIG", "HDL", "CHOLHDL", "VLDL", "LDLCALC" No results found for: "TSH"  Therapeutic Level Labs: No results found for: "LITHIUM" Lab Results  Component Value Date   VALPROATE 33.8 (L) 03/13/2013   No results found for: "CBMZ"  Current Medications: Current Outpatient Medications  Medication Sig Dispense Refill   mirtazapine (REMERON) 15 MG tablet Take 1 tablet (15 mg total) by mouth at bedtime. 30 tablet 2   ALPRAZolam (XANAX) 1 MG tablet Take 1 tablet (1 mg total) by mouth 4 (four) times daily as needed. 120 tablet 2   EPINEPHrine 0.3 mg/0.3 mL IJ SOAJ injection INJECT 0.3 MLS (0.3 MG TOTAL) INTO THE MUSCLE ONCE FOR 1 DOSE.     famotidine (PEPCID) 20 MG tablet Take 20 mg by mouth every evening.      metoprolol succinate (TOPROL-XL) 25 MG 24 hr tablet      OLANZapine (ZYPREXA) 5 MG tablet Take 1 tablet (5 mg total) by mouth at bedtime. 30 tablet 2   pantoprazole (PROTONIX) 40 MG tablet TAKE 1 TABLET (40 MG TOTAL) BY MOUTH 2 (TWO) TIMES DAILY BEFORE A MEAL. 180 tablet 1   rosuvastatin (CRESTOR) 5 MG tablet Take 1 tablet by mouth daily.     No current facility-administered medications for this visit.     Musculoskeletal: Strength & Muscle Tone: within normal limits Gait & Station: normal Patient leans: N/A  Psychiatric Specialty Exam: Review of Systems  Constitutional:  Positive for unexpected weight change.  Psychiatric/Behavioral:  Positive for  dysphoric mood. The patient is nervous/anxious.   All other systems reviewed and are negative.   There were no vitals taken for this visit.There is no height or weight on file to calculate BMI.  General Appearance: Casual and Fairly Groomed  Eye Contact:  Good  Speech:  Clear and Coherent  Volume:  Normal  Mood:  Dysphoric  Affect:  Congruent  Thought Process:  Goal Directed  Orientation:  Full (Time, Place, and Person)  Thought Content: Rumination   Suicidal Thoughts:  No  Homicidal Thoughts:  No  Memory:  Immediate;   Good Recent;   Good Remote;   Good  Judgement:  Good  Insight:  Fair  Psychomotor Activity:  Decreased  Concentration:  Concentration: Good and Attention Span: Good  Recall:  Good  Fund of Knowledge: Good  Language: Good  Akathisia:  No  Handed:  Right  AIMS (if indicated): not done  Assets:  Communication Skills Desire for Improvement Resilience Social Support Talents/Skills  ADL's:  Intact  Cognition: WNL  Sleep:  Good   Screenings: MDI    Flowsheet Row Office Visit from 11/22/2015 in [facility]  Total Score (max 50) 38      PHQ2-9    Flowsheet Row Counselor from 02/01/2022 in [facility] Most recent reading at 02/05/2022  2:58 PM Video Visit from 02/05/2022 in [place] Most recent reading at 02/05/2022  1:31 PM Video Visit from 01/04/2022 in [place] Most recent reading at 01/04/2022  2:35 PM Video Visit from 11/06/2021 in [facility] Most recent reading at 11/06/2021 10:40 AM Video Visit from 09/12/2021 in [facility] Most recent reading at 09/12/2021  4:08 PM  PHQ-2 Total Score '6 4 5 1 4  '$ PHQ-9 Total Score '25 15 10 '$ -- 11      Flowsheet Row Counselor from 02/01/2022 in [facility] Most recent  reading at 02/05/2022  2:58 PM Video Visit from 02/05/2022 in [place] Most recent reading at 02/05/2022  1:31 PM Video Visit from 01/04/2022 in [facility] Most recent reading at 01/04/2022  2:35 PM  C-SSRS RISK CATEGORY Error: Question 6 not populated Error: Q3, 4, or 5 should not be populated when Q2 is No Error: Q3, 4, or 5 should not be populated when Q2 is No        Assessment and Plan: This patient is a 51 year old female with a history of bipolar disorder and anxiety.  She is still struggling with depression and poor appetite so we will discontinue amitriptyline in favor of mirtazapine 15 mg at bedtime.  She will continue olanzapine 5 mg at bedtime for mood stabilization and Xanax 1 mg 4 times daily for anxiety.  She will return to see me in 4 weeks  Collaboration of Care: Collaboration of Care: Primary Care Provider AEB notes will be shared with PCP at patient's request  Patient/Guardian was advised Release of Information must be obtained prior to any record release in order to collaborate their care with an outside provider. Patient/Guardian was advised if they have not already done so to contact the registration department to sign all necessary forms in order for [place] to release information regarding their care.   Consent: Patient/Guardian gives verbal consent for treatment and assignment of benefits for services provided during this visit. Patient/Guardian expressed understanding and agreed to proceed.    [name], MD 02/23/2022, 11:36 AM

## 2022-02-26 DIAGNOSIS — R29898 Other symptoms and signs involving the musculoskeletal system: Secondary | ICD-10-CM | POA: Diagnosis not present

## 2022-02-26 DIAGNOSIS — M25512 Pain in left shoulder: Secondary | ICD-10-CM | POA: Diagnosis not present

## 2022-02-26 DIAGNOSIS — M7542 Impingement syndrome of left shoulder: Secondary | ICD-10-CM | POA: Diagnosis not present

## 2022-02-26 DIAGNOSIS — M25612 Stiffness of left shoulder, not elsewhere classified: Secondary | ICD-10-CM | POA: Diagnosis not present

## 2022-02-28 DIAGNOSIS — M25512 Pain in left shoulder: Secondary | ICD-10-CM | POA: Diagnosis not present

## 2022-02-28 DIAGNOSIS — M7542 Impingement syndrome of left shoulder: Secondary | ICD-10-CM | POA: Diagnosis not present

## 2022-02-28 DIAGNOSIS — R29898 Other symptoms and signs involving the musculoskeletal system: Secondary | ICD-10-CM | POA: Diagnosis not present

## 2022-02-28 DIAGNOSIS — M25612 Stiffness of left shoulder, not elsewhere classified: Secondary | ICD-10-CM | POA: Diagnosis not present

## 2022-03-13 ENCOUNTER — Telehealth (HOSPITAL_COMMUNITY): Payer: Self-pay | Admitting: *Deleted

## 2022-03-13 NOTE — Telephone Encounter (Signed)
Patient called stating that her [facility] is [facility] of [facility] at [facility] and it's on a back order and she called [facility] and they have enough in stock. Per pt she would like for provider to please send script to [facility].

## 2022-03-14 ENCOUNTER — Other Ambulatory Visit (HOSPITAL_COMMUNITY): Payer: Self-pay | Admitting: Psychiatry

## 2022-03-14 MED ORDER — ALPRAZOLAM 1 MG PO TABS: 1.0000 mg | ORAL_TABLET | Freq: Four times a day (QID) | ORAL | 2 refills | Status: DC | PRN

## 2022-03-14 NOTE — Telephone Encounter (Signed)
sent 

## 2022-03-15 ENCOUNTER — Other Ambulatory Visit (HOSPITAL_COMMUNITY): Payer: Self-pay | Admitting: Psychiatry

## 2022-03-15 ENCOUNTER — Telehealth (HOSPITAL_COMMUNITY): Payer: Self-pay | Admitting: *Deleted

## 2022-03-15 MED ORDER — ALPRAZOLAM 1 MG PO TABS: 1.0000 mg | ORAL_TABLET | Freq: Four times a day (QID) | ORAL | 2 refills | Status: DC | PRN

## 2022-03-15 MED ORDER — MIRTAZAPINE 15 MG PO TABS: 15.0000 mg | ORAL_TABLET | Freq: Every day | ORAL | 2 refills | Status: DC

## 2022-03-15 MED ORDER — OLANZAPINE 5 MG PO TABS: 5.0000 mg | ORAL_TABLET | Freq: Every day | ORAL | 2 refills | Status: DC

## 2022-03-15 NOTE — Telephone Encounter (Signed)
Patient called stating that the pharmacy [facility] wants to know what to do about her Valium and Xanax due to patient just picked up her Valium.  Staff called [facility] and spoke with the pharmacist and he stated that patient just picked up her Valium not that long ago. Per pharmacist, they would like to know if provider still wants them to fill the Xanax although patient just picked up her Valium. Staff asked pharmacy if patient brought in the Valium she have already filled will they be able to give her the Xanax and she stated yes they would be able to. Informed Pharmacy that message will be sent to the provider for advice.   Staff called patient and informed her with what [facility] stated and they will be willing to take her recently filled Valium for them to fill her Xanax.   Per pt, pharmacy is trying to be difficult. She have the Valium for just incase she runs out of the Xanax on a day that the office is closed and with all of the back and forth with them, to just inform provider to send her Xanax's to [facility] instead. Per pt she'll just have to deal with waiting for her medications to come in the mail. Per pt, she just want provider to please send in ALL of her medications to [place] [facility] instead of the local pharmacy.   Staff informed patient that message will be sent to provider to see what she says.  While typing up note, [facility] called about patient to inform office with update on their end.  [facility] [place] [facility] and stated that on the 4th of November they filled patient Valium. Per pharmacist, patient tried to also pick up her Xanax's and they refused her the Xanax and she was fine with that. Per pharmacist now they just received a message from [place] them that patient is trying to fill her Xanax with them. Per Pharmacist, they just wanted provider to know. They feel like patient has been switching pharmacy back and forth after they [facility]ating [facility] [place]. Per [facility] pharmacist, they have had this same issue in the past with this patient and just wanted provider to know.   Please advice

## 2022-03-15 NOTE — Telephone Encounter (Signed)
Pharmacy will D/C both her Xanax and her Valium since she is getting it from the mail order. Patient informed that medications including the Xanax will be sent to the pharmacy of her choice.

## 2022-03-20 DIAGNOSIS — F319 Bipolar disorder, unspecified: Secondary | ICD-10-CM | POA: Diagnosis not present

## 2022-03-26 ENCOUNTER — Telehealth (INDEPENDENT_AMBULATORY_CARE_PROVIDER_SITE_OTHER): Payer: Medicare HMO | Admitting: Psychiatry

## 2022-03-26 ENCOUNTER — Telehealth (HOSPITAL_COMMUNITY): Payer: Self-pay

## 2022-03-26 ENCOUNTER — Encounter (HOSPITAL_COMMUNITY): Payer: Self-pay | Admitting: Psychiatry

## 2022-03-26 DIAGNOSIS — R630 Anorexia: Secondary | ICD-10-CM | POA: Diagnosis not present

## 2022-03-26 DIAGNOSIS — F411 Generalized anxiety disorder: Secondary | ICD-10-CM | POA: Diagnosis not present

## 2022-03-26 DIAGNOSIS — R5383 Other fatigue: Secondary | ICD-10-CM | POA: Diagnosis not present

## 2022-03-26 DIAGNOSIS — F3132 Bipolar disorder, current episode depressed, moderate: Secondary | ICD-10-CM

## 2022-03-26 DIAGNOSIS — I10 Essential (primary) hypertension: Secondary | ICD-10-CM | POA: Diagnosis not present

## 2022-03-26 DIAGNOSIS — E7800 Pure hypercholesterolemia, unspecified: Secondary | ICD-10-CM | POA: Diagnosis not present

## 2022-03-26 DIAGNOSIS — Z299 Encounter for prophylactic measures, unspecified: Secondary | ICD-10-CM | POA: Diagnosis not present

## 2022-03-26 MED ORDER — MIRTAZAPINE 15 MG PO TABS: 7.5000 mg | ORAL_TABLET | Freq: Every day | ORAL | 2 refills | Status: DC

## 2022-03-26 MED ORDER — OLANZAPINE 5 MG PO TABS: 5.0000 mg | ORAL_TABLET | Freq: Every day | ORAL | 2 refills | Status: DC

## 2022-03-26 MED ORDER — DESVENLAFAXINE SUCCINATE ER 50 MG PO TB24: 50.0000 mg | ORAL_TABLET | Freq: Every day | ORAL | 3 refills | Status: DC

## 2022-03-26 MED ORDER — ALPRAZOLAM 1 MG PO TABS: 1.0000 mg | ORAL_TABLET | Freq: Four times a day (QID) | ORAL | 2 refills | Status: DC | PRN

## 2022-03-26 NOTE — Telephone Encounter (Signed)
[facility] [place] states that records show she picked up Xanax from [facility] last week. She states that she will not be able to fill any additional controlled substances for this patient at that pharmacy. I advised pharmacist that I would let provider know and see how she would like to proceed.

## 2022-03-26 NOTE — Progress Notes (Signed)
Virtual Visit via Telephone Note  I connected with Samantha Chambers on 03/26/22 at  2:20 PM EST by telephone and verified that I am speaking with the correct person using two identifiers.  Location: Patient: home Provider: office   I discussed the limitations, risks, security and privacy concerns of performing an evaluation and management service by telephone and the availability of in person appointments. I also discussed with the patient that there may be a patient responsible charge related to this service. The patient expressed understanding and agreed to proceed.    I discussed the assessment and treatment plan with the patient. The patient was provided an opportunity to ask questions and all were answered. The patient agreed with the plan and demonstrated an understanding of the instructions.   The patient was advised to call back or seek an in-person evaluation if the symptoms worsen or if the condition fails to improve as anticipated.  I provided 20 minutes of non-face-to-face time during this encounter.   [name], MD  [facility] MD/PA/NP OP Progress Note  03/26/2022 2:58 PM Samantha Chambers  MRN:  [id number]  Chief Complaint:  Chief Complaint  Patient presents with   Depression   Anxiety   Follow-up   HPI: This patient is a 51 year old divorced female who lives with her teenage daughter in [place].  She is on disability.  The patient returns for follow-up after 4 weeks.  She is doing better in some ways.  She is eating better and has gained back a little bit of weight.  She has started therapy with an online therapist and is also taking yoga classes and getting massages.  She admits that she is still somehow connected to the 2 men she was saying.  One of them is letting her use his credit card to buy things for herself.  She started seeing the other 1 again as well.  She states that her primary nurse practitioner suggested trying Pristiq and I think it is worth a try.  She had  to cut back on the mirtazapine because it made her too drowsy and she could not get out of bed.  She does not have any more suicidal thoughts.  She is still struggling with her teenage daughter's behavior which is causing more anxiety.  Some nights she is not able to sleep well despite the medications at bedtime.  She takes Ambien periodically but it does not help all that much Visit Diagnosis:    ICD-10-CM   1. Bipolar 1 disorder, depressed, moderate ([place])  F31.32     2. Generalized anxiety disorder  F41.1       Past Psychiatric History: Long-term outpatient treatment  Past Medical History:  Past Medical History:  Diagnosis Date   Angio-edema    Anxiety    Bipolar disorder ([place])    Depression    Headache(784.0)    Irritable bowel    Urticaria     Past Surgical History:  Procedure Laterality Date   FOOT SURGERY      Family Psychiatric History: See below  Family History:  Family History  Problem Relation Age of Onset   Bipolar disorder Mother    Depression Maternal Aunt    Depression Maternal Uncle    Urticaria Neg Hx    Immunodeficiency Neg Hx    Eczema Neg Hx    Atopy Neg Hx    Asthma Neg Hx    Angioedema Neg Hx    Allergic rhinitis Neg Hx  Social History:  Social History   Socioeconomic History   Marital status: Divorced    Spouse name: Not on file   Number of children: Not on file   Years of education: Not on file   Highest education level: Not on file  Occupational History   Not on file  Tobacco Use   Smoking status: Never   Smokeless tobacco: Never  Vaping Use   Vaping Use: Never used  Substance and Sexual Activity   Alcohol use: Yes    Comment: Rare glass of wine   Drug use: Yes    Types: Marijuana    Comment: smokes cannabis for anxiety    Sexual activity: Not Currently    Partners: Male    Birth control/protection: Pill  Other Topics Concern   Not on file  Social History Narrative   Not on file   Social Determinants of Health    Financial Resource Strain: Not on file  Food Insecurity: Not on file  Transportation Needs: Not on file  Physical Activity: Not on file  Stress: Not on file  Social Connections: Not on file    Allergies:  Allergies  Allergen Reactions   Clonazepam Nausea And Vomiting   Sulfa Antibiotics    Vilazodone     Per Pt med made her sick    Metabolic Disorder Labs: No results found for: "HGBA1C", "MPG" No results found for: "PROLACTIN" No results found for: "CHOL", "TRIG", "HDL", "CHOLHDL", "VLDL", "LDLCALC" No results found for: "TSH"  Therapeutic Level Labs: No results found for: "LITHIUM" Lab Results  Component Value Date   VALPROATE 33.8 (L) 03/13/2013   No results found for: "CBMZ"  Current Medications: Current Outpatient Medications  Medication Sig Dispense Refill   desvenlafaxine (PRISTIQ) 50 MG 24 hr tablet Take 1 tablet (50 mg total) by mouth daily. 30 tablet 3   ALPRAZolam (XANAX) 1 MG tablet Take 1 tablet (1 mg total) by mouth 4 (four) times daily as needed. 120 tablet 2   EPINEPHrine 0.3 mg/0.3 mL IJ SOAJ injection INJECT 0.3 MLS (0.3 MG TOTAL) INTO THE MUSCLE ONCE FOR 1 DOSE.     famotidine (PEPCID) 20 MG tablet Take 20 mg by mouth every evening.      metoprolol succinate (TOPROL-XL) 25 MG 24 hr tablet      mirtazapine (REMERON) 15 MG tablet Take 0.5 tablets (7.5 mg total) by mouth at bedtime. 30 tablet 2   OLANZapine (ZYPREXA) 5 MG tablet Take 1 tablet (5 mg total) by mouth at bedtime. 30 tablet 2   pantoprazole (PROTONIX) 40 MG tablet TAKE 1 TABLET (40 MG TOTAL) BY MOUTH 2 (TWO) TIMES DAILY BEFORE A MEAL. 180 tablet 1   rosuvastatin (CRESTOR) 5 MG tablet Take 1 tablet by mouth daily.     No current facility-administered medications for this visit.     Musculoskeletal: Strength & Muscle Tone: na Gait & Station: na Patient leans: N/A  Psychiatric Specialty Exam: Review of Systems  Psychiatric/Behavioral:  Positive for dysphoric mood and sleep  disturbance. The patient is nervous/anxious.   All other systems reviewed and are negative.   There were no vitals taken for this visit.There is no height or weight on file to calculate BMI.  General Appearance: NA  Eye Contact:  NA  Speech:  Clear and Coherent  Volume:  Normal  Mood:  Anxious and Dysphoric  Affect:  NA  Thought Process:  Goal Directed  Orientation:  Full (Time, Place, and Person)  Thought Content: Rumination  Suicidal Thoughts:  No  Homicidal Thoughts:  No  Memory:  Immediate;   Good Recent;   Good Remote;   Fair  Judgement:  Fair  Insight:  Fair  Psychomotor Activity:  Normal  Concentration:  Concentration: Good and Attention Span: Good  Recall:  Good  Fund of Knowledge: Good  Language: Good  Akathisia:  No  Handed:  Right  AIMS (if indicated): not done  Assets:  Communication Skills Desire for Improvement Physical Health Resilience Social Support Talents/Skills  ADL's:  Intact  Cognition: WNL  Sleep:  Fair   Screenings: MDI    [place] Office Visit from 11/22/2015 in [facility]  Total Score (max 50) 38      PHQ2-9    Flowsheet Row Counselor from 02/01/2022 in [facility] Most recent reading at 02/05/2022  2:58 PM Video Visit from 02/05/2022 in [facility] Most recent reading at 02/05/2022  1:31 PM Video Visit from 01/04/2022 in [facility] Most recent reading at 01/04/2022  2:35 PM Video Visit from 11/06/2021 in [employer] Most recent reading at 11/06/2021 10:40 AM Video Visit from 09/12/2021 in [facility] Most recent reading at 09/12/2021  4:08 PM  PHQ-2 Total Score '6 4 5 1 4  '$ PHQ-9 Total Score '25 15 10 '$ -- 11      Flowsheet Row Counselor from 02/01/2022 in [facility] Most recent reading  at 02/05/2022  2:58 PM Video Visit from 02/05/2022 in [place] Most recent reading at 02/05/2022  1:31 PM Video Visit from 01/04/2022 in [facility] Most recent reading at 01/04/2022  2:35 PM  C-SSRS RISK CATEGORY Error: Question 6 not populated Error: Q3, 4, or 5 should not be populated when Q2 is No Error: Q3, 4, or 5 should not be populated when Q2 is No        Assessment and Plan: This patient is a 51 year old female with a history of bipolar disorder and anxiety.  She is not able to tolerate the full dose of mirtazapine so she will go back to 7.5 mg at bedtime.  She will continue olanzapine 5 mg at bedtime for mood stabilization and Xanax 1 mg 4 times daily for anxiety.  We will add Pristiq 50 mg daily for anxiety and depression.  She will return to see me in 4 weeks  Collaboration of Care: Collaboration of Care: Primary Care Provider AEB will be shared with PCP at patient's request  Patient/Guardian was advised Release of Information must be obtained prior to any record release in order to collaborate their care with an outside provider. Patient/Guardian was advised if they have not already done so to contact the registration department to sign all necessary forms in order for [place] to release information regarding their care.   Consent: Patient/Guardian gives verbal consent for treatment and assignment of benefits for services provided during this visit. Patient/Guardian expressed understanding and agreed to proceed.    [name], MD 03/26/2022, 2:58 PM

## 2022-03-26 NOTE — Telephone Encounter (Signed)
Called patient pharmacy and spoke with Margreta Journey and informed her with what provider stated and she verbalized understanding.

## 2022-03-27 ENCOUNTER — Encounter (HOSPITAL_COMMUNITY): Payer: Self-pay | Admitting: *Deleted

## 2022-03-27 DIAGNOSIS — F319 Bipolar disorder, unspecified: Secondary | ICD-10-CM | POA: Diagnosis not present

## 2022-03-27 NOTE — Telephone Encounter (Signed)
Called to inform patient with previous message and to call office back to sch a f/u appt with provider.

## 2022-04-03 DIAGNOSIS — F319 Bipolar disorder, unspecified: Secondary | ICD-10-CM | POA: Diagnosis not present

## 2022-04-10 DIAGNOSIS — F319 Bipolar disorder, unspecified: Secondary | ICD-10-CM | POA: Diagnosis not present

## 2022-04-12 DIAGNOSIS — Z1231 Encounter for screening mammogram for malignant neoplasm of breast: Secondary | ICD-10-CM | POA: Diagnosis not present

## 2022-04-17 DIAGNOSIS — F319 Bipolar disorder, unspecified: Secondary | ICD-10-CM | POA: Diagnosis not present

## 2022-04-18 ENCOUNTER — Encounter (HOSPITAL_COMMUNITY): Payer: Self-pay | Admitting: Psychiatry

## 2022-04-18 ENCOUNTER — Telehealth (HOSPITAL_COMMUNITY): Payer: Self-pay | Admitting: *Deleted

## 2022-04-18 ENCOUNTER — Telehealth (INDEPENDENT_AMBULATORY_CARE_PROVIDER_SITE_OTHER): Payer: Medicare HMO | Admitting: Psychiatry

## 2022-04-18 DIAGNOSIS — R42 Dizziness and giddiness: Secondary | ICD-10-CM | POA: Diagnosis not present

## 2022-04-18 DIAGNOSIS — F411 Generalized anxiety disorder: Secondary | ICD-10-CM | POA: Diagnosis not present

## 2022-04-18 DIAGNOSIS — F3132 Bipolar disorder, current episode depressed, moderate: Secondary | ICD-10-CM

## 2022-04-18 DIAGNOSIS — E782 Mixed hyperlipidemia: Secondary | ICD-10-CM | POA: Diagnosis not present

## 2022-04-18 MED ORDER — OLANZAPINE 5 MG PO TABS: 5.0000 mg | ORAL_TABLET | Freq: Every day | ORAL | 2 refills | Status: DC

## 2022-04-18 MED ORDER — ALPRAZOLAM 1 MG PO TABS: 1.0000 mg | ORAL_TABLET | Freq: Four times a day (QID) | ORAL | 2 refills | Status: DC | PRN

## 2022-04-18 MED ORDER — DESVENLAFAXINE SUCCINATE ER 50 MG PO TB24: 50.0000 mg | ORAL_TABLET | Freq: Every day | ORAL | 3 refills | Status: DC

## 2022-04-18 MED ORDER — MIRTAZAPINE 15 MG PO TABS: 7.5000 mg | ORAL_TABLET | Freq: Every day | ORAL | 2 refills | Status: DC

## 2022-04-18 NOTE — Progress Notes (Signed)
Virtual Visit via Telephone Note  I connected with Samantha Chambers on 04/18/22 at 10:20 AM EST by telephone and verified that I am speaking with the correct person using two identifiers.  Location: Patient: home Provider: office   I discussed the limitations, risks, security and privacy concerns of performing an evaluation and management service by telephone and the availability of in person appointments. I also discussed with the patient that there may be a patient responsible charge related to this service. The patient expressed understanding and agreed to proceed.     I discussed the assessment and treatment plan with the patient. The patient was provided an opportunity to ask questions and all were answered. The patient agreed with the plan and demonstrated an understanding of the instructions.   The patient was advised to call back or seek an in-person evaluation if the symptoms worsen or if the condition fails to improve as anticipated.  I provided 15 minutes of non-face-to-face time during this encounter.   Samantha Spiller, MD  [facility] MD/PA/NP OP Progress Note  04/18/2022 11:47 AM Samantha Chambers  MRN:  [id number]  Chief Complaint:  Chief Complaint  Patient presents with   Depression   Anxiety   Follow-up   HPI: This patient is a 51 year old divorced female who lives with her teenage daughter in [place].  She is on disability.  The patient returns for follow-up after 4 weeks overall she is doing a little bit better.  She still having a lot of struggles with her teenage daughter who she describes as varying very rebellious and difficult.  However she is doing more to care for herself.  She is eating a little bit better although her weight has not gone up.  She continues with the online therapist and is taking yoga classes.  She denies any suicidal thoughts.  She does not have a lot of motivation but is slowly getting better.  She thinks that the Pristiq is helping with the  depression. Visit Diagnosis:    ICD-10-CM   1. Bipolar 1 disorder, depressed, moderate ([place])  F31.32     2. Generalized anxiety disorder  F41.1       Past Psychiatric History: Long-term outpatient treatment  Past Medical History:  Past Medical History:  Diagnosis Date   Angio-edema    Anxiety    Bipolar disorder ([place])    Depression    Headache(784.0)    Irritable bowel    Urticaria     Past Surgical History:  Procedure Laterality Date   FOOT SURGERY      Family Psychiatric History: See below  Family History:  Family History  Problem Relation Age of Onset   Bipolar disorder Mother    Depression Maternal Aunt    Depression Maternal Uncle    Urticaria Neg Hx    Immunodeficiency Neg Hx    Eczema Neg Hx    Atopy Neg Hx    Asthma Neg Hx    Angioedema Neg Hx    Allergic rhinitis Neg Hx     Social History:  Social History   Socioeconomic History   Marital status: Divorced    Spouse name: Not on file   Number of children: Not on file   Years of education: Not on file   Highest education level: Not on file  Occupational History   Not on file  Tobacco Use   Smoking status: Never   Smokeless tobacco: Never  Vaping Use   Vaping Use: Never used  Substance  and Sexual Activity   Alcohol use: Yes    Comment: Rare glass of wine   Drug use: Yes    Types: Marijuana    Comment: smokes cannabis for anxiety    Sexual activity: Not Currently    Partners: Male    Birth control/protection: Pill  Other Topics Concern   Not on file  Social History Narrative   Not on file   Social Determinants of Health   Financial Resource Strain: Not on file  Food Insecurity: Not on file  Transportation Needs: Not on file  Physical Activity: Not on file  Stress: Not on file  Social Connections: Not on file    Allergies:  Allergies  Allergen Reactions   Clonazepam Nausea And Vomiting   Sulfa Antibiotics    Vilazodone     Per Pt med made her sick    Metabolic Disorder  Labs: No results found for: "HGBA1C", "MPG" No results found for: "PROLACTIN" No results found for: "CHOL", "TRIG", "HDL", "CHOLHDL", "VLDL", "LDLCALC" No results found for: "TSH"  Therapeutic Level Labs: No results found for: "LITHIUM" Lab Results  Component Value Date   VALPROATE 33.8 (L) 03/13/2013   No results found for: "CBMZ"  Current Medications: Current Outpatient Medications  Medication Sig Dispense Refill   ALPRAZolam (XANAX) 1 MG tablet Take 1 tablet (1 mg total) by mouth 4 (four) times daily as needed. 120 tablet 2   desvenlafaxine (PRISTIQ) 50 MG 24 hr tablet Take 1 tablet (50 mg total) by mouth daily. 30 tablet 3   EPINEPHrine 0.3 mg/0.3 mL IJ SOAJ injection INJECT 0.3 MLS (0.3 MG TOTAL) INTO THE MUSCLE ONCE FOR 1 DOSE.     famotidine (PEPCID) 20 MG tablet Take 20 mg by mouth every evening.      metoprolol succinate (TOPROL-XL) 25 MG 24 hr tablet      mirtazapine (REMERON) 15 MG tablet Take 0.5 tablets (7.5 mg total) by mouth at bedtime. 30 tablet 2   OLANZapine (ZYPREXA) 5 MG tablet Take 1 tablet (5 mg total) by mouth at bedtime. 30 tablet 2   pantoprazole (PROTONIX) 40 MG tablet TAKE 1 TABLET (40 MG TOTAL) BY MOUTH 2 (TWO) TIMES DAILY BEFORE A MEAL. 180 tablet 1   rosuvastatin (CRESTOR) 5 MG tablet Take 1 tablet by mouth daily.     No current facility-administered medications for this visit.     Musculoskeletal: Strength & Muscle Tone: na Gait & Station: na Patient leans: N/A  Psychiatric Specialty Exam: Review of Systems  All other systems reviewed and are negative.   There were no vitals taken for this visit.There is no height or weight on file to calculate BMI.  General Appearance: NA  Eye Contact:  NA  Speech:  Clear and Coherent  Volume:  Normal  Mood:  Dysphoric  Affect:  NA  Thought Process:  Goal Directed  Orientation:  Full (Time, Place, and Person)  Thought Content: Rumination   Suicidal Thoughts:  No  Homicidal Thoughts:  No  Memory:   Immediate;   Good Recent;   Good Remote;   Good  Judgement:  Good  Insight:  Fair  Psychomotor Activity:  Decreased  Concentration:  Concentration: Good and Attention Span: Good  Recall:  Good  Fund of Knowledge: Good  Language: Good  Akathisia:  No  Handed:  Right  AIMS (if indicated): not done  Assets:  Communication Skills Desire for Improvement Physical Health Resilience Social Support Talents/Skills  ADL's:  Intact  Cognition: WNL  Sleep:  Good   Screenings: MDI    Flowsheet Row Office Visit from 11/22/2015 in [facility]  Total Score (max 50) 38      PHQ2-9    Flowsheet Row Counselor from 02/01/2022 in [facility] Most recent reading at 02/05/2022  2:58 PM Video Visit from 02/05/2022 in [place] Most recent reading at 02/05/2022  1:31 PM Video Visit from 01/04/2022 in [place] Most recent reading at 01/04/2022  2:35 PM Video Visit from 11/06/2021 in [place] Most recent reading at 11/06/2021 10:40 AM Video Visit from 09/12/2021 in [place] [facility] [facility] Most recent reading at 09/12/2021  4:08 PM  PHQ-2 Total Score '6 4 5 1 4  '$ PHQ-9 Total Score '25 15 10 '$ -- 11      Flowsheet Row Counselor from 02/01/2022 in [place] Most recent reading at 02/05/2022  2:58 PM Video Visit from 02/05/2022 in [facility] Most recent reading at 02/05/2022  1:31 PM Video Visit from 01/04/2022 in [place] Most recent reading at 01/04/2022  2:35 PM  C-SSRS RISK CATEGORY Error: Question 6 not populated Error: Q3, 4, or 5 should not be populated when Q2 is No Error: Q3, 4, or 5 should not be populated when Q2 is No        Assessment and Plan: This patient is a  51 year old female with a history of bipolar disorder and anxiety.  She is doing a little bit better with the addition of Pristiq 50 mg daily for depression and anxiety.  She will continue this as well as olanzapine 5 mg at bedtime for mood stabilization and mirtazapine 7.5 mg at bedtime for anxiety and sleep as well as Xanax 1 mg 4 times daily for anxiety.  She will return to see me in 4 weeks  Collaboration of Care: Collaboration of Care: Primary Care Provider AEB notes will be shared with PCP at patient's request  Patient/Guardian was advised Release of Information must be obtained prior to any record release in order to collaborate their care with an outside provider. Patient/Guardian was advised if they have not already done so to contact the registration department to sign all necessary forms in order for [place] to release information regarding their care.   Consent: Patient/Guardian gives verbal consent for treatment and assignment of benefits for services provided during this visit. Patient/Guardian expressed understanding and agreed to proceed.    Samantha Spiller, MD 04/18/2022, 11:47 AM

## 2022-04-18 NOTE — Telephone Encounter (Signed)
She no longer uses mail order

## 2022-04-18 NOTE — Telephone Encounter (Signed)
Patient pharmacy called stating they are just verifying that provider meant to send the Xanax to them. Per pt pharmacy, they thought that patient was sup[pose to be using a mail order pharmacy due to previous conversation .  Per pharmacy, if provider meant to send script they will go ahead and fill script. ([facility] from [facility] in [place]).

## 2022-04-19 NOTE — Telephone Encounter (Signed)
Spoke with Margreta Journey at [facility] and informed them with what provider stated and that they could go ahead and fill patient medication. Margreta Journey agreed and verbalized understanding.

## 2022-04-23 DIAGNOSIS — R0602 Shortness of breath: Secondary | ICD-10-CM | POA: Diagnosis not present

## 2022-04-23 DIAGNOSIS — Z299 Encounter for prophylactic measures, unspecified: Secondary | ICD-10-CM | POA: Diagnosis not present

## 2022-04-23 DIAGNOSIS — R630 Anorexia: Secondary | ICD-10-CM | POA: Diagnosis not present

## 2022-04-23 DIAGNOSIS — F319 Bipolar disorder, unspecified: Secondary | ICD-10-CM | POA: Diagnosis not present

## 2022-04-24 DIAGNOSIS — F319 Bipolar disorder, unspecified: Secondary | ICD-10-CM | POA: Diagnosis not present

## 2022-05-02 DIAGNOSIS — F250 Schizoaffective disorder, bipolar type: Secondary | ICD-10-CM | POA: Diagnosis not present

## 2022-05-02 DIAGNOSIS — F319 Bipolar disorder, unspecified: Secondary | ICD-10-CM | POA: Diagnosis not present

## 2022-05-04 DIAGNOSIS — H52223 Regular astigmatism, bilateral: Secondary | ICD-10-CM | POA: Diagnosis not present

## 2022-05-04 DIAGNOSIS — H524 Presbyopia: Secondary | ICD-10-CM | POA: Diagnosis not present

## 2022-05-10 DIAGNOSIS — F319 Bipolar disorder, unspecified: Secondary | ICD-10-CM | POA: Diagnosis not present

## 2022-05-21 DIAGNOSIS — F319 Bipolar disorder, unspecified: Secondary | ICD-10-CM | POA: Diagnosis not present

## 2022-05-22 ENCOUNTER — Telehealth (HOSPITAL_COMMUNITY): Payer: Medicare HMO | Admitting: Psychiatry

## 2022-05-28 DIAGNOSIS — F319 Bipolar disorder, unspecified: Secondary | ICD-10-CM | POA: Diagnosis not present

## 2022-06-01 ENCOUNTER — Encounter (HOSPITAL_COMMUNITY): Payer: Self-pay | Admitting: Psychiatry

## 2022-06-01 ENCOUNTER — Telehealth (INDEPENDENT_AMBULATORY_CARE_PROVIDER_SITE_OTHER): Payer: Medicare HMO | Admitting: Psychiatry

## 2022-06-01 DIAGNOSIS — F3132 Bipolar disorder, current episode depressed, moderate: Secondary | ICD-10-CM

## 2022-06-01 DIAGNOSIS — F411 Generalized anxiety disorder: Secondary | ICD-10-CM | POA: Diagnosis not present

## 2022-06-01 MED ORDER — OLANZAPINE 5 MG PO TABS: 5.0000 mg | ORAL_TABLET | Freq: Every day | ORAL | 2 refills | Status: DC

## 2022-06-01 MED ORDER — DESVENLAFAXINE SUCCINATE ER 50 MG PO TB24: 50.0000 mg | ORAL_TABLET | Freq: Every day | ORAL | 3 refills | Status: DC

## 2022-06-01 MED ORDER — ALPRAZOLAM 1 MG PO TABS: 1.0000 mg | ORAL_TABLET | Freq: Four times a day (QID) | ORAL | 2 refills | Status: DC | PRN

## 2022-06-01 NOTE — Progress Notes (Signed)
Virtual Visit via Video Note  I connected with Samantha Chambers on 06/01/22 at  9:40 AM EST by a video enabled telemedicine application and verified that I am speaking with the correct person using two identifiers.  Location: Patient: home Provider: home office   I discussed the limitations of evaluation and management by telemedicine and the availability of in person appointments. The patient expressed understanding and agreed to proceed.     I discussed the assessment and treatment plan with the patient. The patient was provided an opportunity to ask questions and all were answered. The patient agreed with the plan and demonstrated an understanding of the instructions.   The patient was advised to call back or seek an in-person evaluation if the symptoms worsen or if the condition fails to improve as anticipated.  I provided 20 minutes of non-face-to-face time during this encounter.   [name], MD  [facility] MD/PA/NP OP Progress Note  06/01/2022 10:05 AM Samantha Chambers  MRN:  [id number]  Chief Complaint:  Chief Complaint  Patient presents with   Anxiety   Depression   Manic Behavior   Follow-up   HPI: This patient is a 52 year old divorced white female who lives with her teenage daughter in [place].  She is on disability.  The patient returns for follow-up after 4 weeks regarding her depression and anxiety.  She still having a lot of struggles with her teenage daughter who can be very rebellious and difficult.  However the daughter is doing slightly better lately.  She is made up her mind that if her daughter gets out of control again she is going to pursue residential treatment for her.  She is still not gaining much weight and her weight remains between 104 and 106 pounds.  She has not getting much exercise although she occasionally goes to yoga.  She does not think the mirtazapine has done much for her appetite or mood or sleep so she would like to stop it.  She does think the  Pristiq is starting to help.  She is still making some poor decisions regarding man and it have been going out with men she meets on dating sites with sometimes not very good results.  She states that she is going to take a break from this.  She denies any thoughts of self-harm or suicide.  The Xanax continues to help her anxiety. Visit Diagnosis:    ICD-10-CM   1. Bipolar 1 disorder, depressed, moderate ([place])  F31.32     2. Generalized anxiety disorder  F41.1       Past Psychiatric History: Long-term outpatient treatment  Past Medical History:  Past Medical History:  Diagnosis Date   Angio-edema    Anxiety    Bipolar disorder ([place])    Depression    Headache(784.0)    Irritable bowel    Urticaria     Past Surgical History:  Procedure Laterality Date   FOOT SURGERY      Family Psychiatric History: See below  Family History:  Family History  Problem Relation Age of Onset   Bipolar disorder Mother    Depression Maternal Aunt    Depression Maternal Uncle    Urticaria Neg Hx    Immunodeficiency Neg Hx    Eczema Neg Hx    Atopy Neg Hx    Asthma Neg Hx    Angioedema Neg Hx    Allergic rhinitis Neg Hx     Social History:  Social History   Socioeconomic History  Marital status: Divorced    Spouse name: Not on file   Number of children: Not on file   Years of education: Not on file   Highest education level: Not on file  Occupational History   Not on file  Tobacco Use   Smoking status: Never   Smokeless tobacco: Never  Vaping Use   Vaping Use: Never used  Substance and Sexual Activity   Alcohol use: Yes    Comment: Rare glass of wine   Drug use: Yes    Types: Marijuana    Comment: smokes cannabis for anxiety    Sexual activity: Not Currently    Partners: Male    Birth control/protection: Pill  Other Topics Concern   Not on file  Social History Narrative   Not on file   Social Determinants of Health   Financial Resource Strain: Not on file  Food  Insecurity: Not on file  Transportation Needs: Not on file  Physical Activity: Not on file  Stress: Not on file  Social Connections: Not on file    Allergies:  Allergies  Allergen Reactions   Clonazepam Nausea And Vomiting   Sulfa Antibiotics    Vilazodone     Per Pt med made her sick    Metabolic Disorder Labs: No results found for: "HGBA1C", "MPG" No results found for: "PROLACTIN" No results found for: "CHOL", "TRIG", "HDL", "CHOLHDL", "VLDL", "LDLCALC" No results found for: "TSH"  Therapeutic Level Labs: No results found for: "LITHIUM" Lab Results  Component Value Date   VALPROATE 33.8 (L) 03/13/2013   No results found for: "CBMZ"  Current Medications: Current Outpatient Medications  Medication Sig Dispense Refill   ALPRAZolam (XANAX) 1 MG tablet Take 1 tablet (1 mg total) by mouth 4 (four) times daily as needed. 120 tablet 2   desvenlafaxine (PRISTIQ) 50 MG 24 hr tablet Take 1 tablet (50 mg total) by mouth daily. 30 tablet 3   EPINEPHrine 0.3 mg/0.3 mL IJ SOAJ injection INJECT 0.3 MLS (0.3 MG TOTAL) INTO THE MUSCLE ONCE FOR 1 DOSE.     famotidine (PEPCID) 20 MG tablet Take 20 mg by mouth every evening.      metoprolol succinate (TOPROL-XL) 25 MG 24 hr tablet      OLANZapine (ZYPREXA) 5 MG tablet Take 1 tablet (5 mg total) by mouth at bedtime. 30 tablet 2   pantoprazole (PROTONIX) 40 MG tablet TAKE 1 TABLET (40 MG TOTAL) BY MOUTH 2 (TWO) TIMES DAILY BEFORE A MEAL. 180 tablet 1   rosuvastatin (CRESTOR) 5 MG tablet Take 1 tablet by mouth daily.     No current facility-administered medications for this visit.     Musculoskeletal: Strength & Muscle Tone: within normal limits Gait & Station: normal Patient leans: N/A  Psychiatric Specialty Exam: Review of Systems  All other systems reviewed and are negative.   There were no vitals taken for this visit.There is no height or weight on file to calculate BMI.  General Appearance: Casual and Fairly Groomed  Eye  Contact:  Good  Speech:  Clear and Coherent  Volume:  Normal  Mood:  Anxious  Affect:  Appropriate and Congruent  Thought Process:  Goal Directed  Orientation:  Full (Time, Place, and Person)  Thought Content: Rumination   Suicidal Thoughts:  No  Homicidal Thoughts:  No  Memory:  Immediate;   Good Recent;   Good Remote;   Good  Judgement:  Good  Insight:  Fair  Psychomotor Activity:  Decreased  Concentration:  Concentration: Good and Attention Span: Good  Recall:  Good  Fund of Knowledge: Good  Language: Good  Akathisia:  No  Handed:  Right  AIMS (if indicated): not done  Assets:  Communication Skills Desire for Improvement Physical Health Resilience Social Support  ADL's:  Intact  Cognition: WNL  Sleep:  Fair   Screenings: MDI    [place] Office Visit from 11/22/2015 in [facility]  Total Score (max 50) 38      PHQ2-9    Flowsheet Row Counselor from 02/01/2022 in [facility] Most recent reading at 02/05/2022  2:58 PM Video Visit from 02/05/2022 in [facility] Most recent reading at 02/05/2022  1:31 PM Video Visit from 01/04/2022 in [facility] at [place] Most recent reading at 01/04/2022  2:35 PM Video Visit from 11/06/2021 in [facility] Most recent reading at 11/06/2021 10:40 AM Video Visit from 09/12/2021 in [facility] at [place] [facility] Most recent reading at 09/12/2021  4:08 PM  PHQ-2 Total Score '6 4 5 1 4  '$ PHQ-9 Total Score '25 15 10 '$ -- 11      Flowsheet Row Counselor from 02/01/2022 in [facility] Most recent reading at 02/05/2022  2:58 PM Video Visit from 02/05/2022 in [facility] Most recent reading at 02/05/2022  1:31 PM Video Visit from 01/04/2022 in [facility] Most recent reading at 01/04/2022  2:35 PM  C-SSRS RISK CATEGORY Error: Question 6 not populated Error: Q3, 4, or 5 should not be populated when Q2 is No Error: Q3, 4, or 5 should not be populated when Q2 is No        Assessment and Plan: This patient is a 52 year old female with a history of bipolar disorder and anxiety.  She seems to be a little bit more stable on the current regimen.  She will continue Pristiq 50 mg daily for depression and anxiety olanzapine 5 mg at bedtime for mood stabilization and Xanax 1 mg 4 times daily for anxiety.  She has stopped mirtazapine.  She will return to see me in 2 months  Collaboration of Care: Collaboration of Care: Primary Care Provider AEB notes will be shared with PCP at patient's request  Patient/Guardian was advised Release of Information must be obtained prior to any record release in order to collaborate their care with an outside provider. Patient/Guardian was advised if they have not already done so to contact the registration department to sign all necessary forms in order for [place] to release information regarding their care.   Consent: Patient/Guardian gives verbal consent for treatment and assignment of benefits for services provided during this visit. Patient/Guardian expressed understanding and agreed to proceed.    [name], MD 06/01/2022, 10:05 AM

## 2022-06-11 DIAGNOSIS — F319 Bipolar disorder, unspecified: Secondary | ICD-10-CM | POA: Diagnosis not present

## 2022-06-14 DIAGNOSIS — K589 Irritable bowel syndrome without diarrhea: Secondary | ICD-10-CM | POA: Diagnosis not present

## 2022-06-14 DIAGNOSIS — F319 Bipolar disorder, unspecified: Secondary | ICD-10-CM | POA: Diagnosis not present

## 2022-06-14 DIAGNOSIS — Z299 Encounter for prophylactic measures, unspecified: Secondary | ICD-10-CM | POA: Diagnosis not present

## 2022-06-14 DIAGNOSIS — R159 Full incontinence of feces: Secondary | ICD-10-CM | POA: Diagnosis not present

## 2022-06-18 DIAGNOSIS — F319 Bipolar disorder, unspecified: Secondary | ICD-10-CM | POA: Diagnosis not present

## 2022-06-25 DIAGNOSIS — D239 Other benign neoplasm of skin, unspecified: Secondary | ICD-10-CM | POA: Diagnosis not present

## 2022-06-25 DIAGNOSIS — L570 Actinic keratosis: Secondary | ICD-10-CM | POA: Diagnosis not present

## 2022-06-26 DIAGNOSIS — F319 Bipolar disorder, unspecified: Secondary | ICD-10-CM | POA: Diagnosis not present

## 2022-06-28 ENCOUNTER — Ambulatory Visit (INDEPENDENT_AMBULATORY_CARE_PROVIDER_SITE_OTHER): Payer: Medicare HMO | Admitting: Gastroenterology

## 2022-06-28 ENCOUNTER — Encounter: Payer: Self-pay | Admitting: Gastroenterology

## 2022-06-28 ENCOUNTER — Encounter: Payer: Self-pay | Admitting: *Deleted

## 2022-06-28 ENCOUNTER — Other Ambulatory Visit: Payer: Self-pay | Admitting: *Deleted

## 2022-06-28 ENCOUNTER — Telehealth: Payer: Self-pay | Admitting: *Deleted

## 2022-06-28 VITALS — BP 120/72 | HR 103 | Temp 98.2°F | Ht 63.0 in | Wt 110.2 lb

## 2022-06-28 DIAGNOSIS — R159 Full incontinence of feces: Secondary | ICD-10-CM | POA: Insufficient documentation

## 2022-06-28 DIAGNOSIS — K219 Gastro-esophageal reflux disease without esophagitis: Secondary | ICD-10-CM

## 2022-06-28 DIAGNOSIS — R152 Fecal urgency: Secondary | ICD-10-CM

## 2022-06-28 MED ORDER — PANTOPRAZOLE SODIUM 40 MG PO TBEC: 40.0000 mg | DELAYED_RELEASE_TABLET | Freq: Every day | ORAL | 3 refills | Status: DC

## 2022-06-28 MED ORDER — PEG 3350-KCL-NA BICARB-NACL 420 G PO SOLR: 4000.0000 mL | Freq: Once | ORAL | 0 refills | Status: AC

## 2022-06-28 NOTE — Telephone Encounter (Signed)
[facility] PA: Approved Authorization [id number]  Tracking [id number]  DOS: 07/17/22-09/14/22

## 2022-06-28 NOTE — H&P (View-Only) (Signed)
  Gastroenterology Office Note    Referring Provider: Shah, Ashish, MD Primary Care Physician:  Shah, Ashish, MD  Primary GI: Dr. Carver    Chief Complaint   Chief Complaint  Patient presents with   New Patient (Initial Visit)    Pt referred for bowel incontinence     History of Present Illness   Samantha Chambers is a 51 y.o. female presenting today at request of PCP due to fecal incontinence. Last colonoscopy about 13 years ago.   She notes long-standing history of multiple stressors in her life including with her daughter and shared much of this today. She notes worsening symptoms of dyspepsia and bowel habit changes with stress.   States she had episode a few months ago with drinking 2 glasses of wine on an empty stomach then had fecal incontinence, watery stool. 2 weeks later ate fajitas, 2 margaritas, and had another episode.   In the morning will occasionally have fecal urgency. If doesn't make it in time, she will have incontinence. Small amount of soft stool will come out. May need several BMs in the morning that are mushy before she is done.  Notes burping, epigastric discomfort, worsening GERD symptoms. Currently no PPI. No rectal bleeding.    Past Medical History:  Diagnosis Date   Angio-edema    Anxiety    Bipolar disorder (HCC)    Depression    Headache(784.0)    Irritable bowel    Urticaria     Past Surgical History:  Procedure Laterality Date   FOOT SURGERY      Current Outpatient Medications  Medication Sig Dispense Refill   ALPRAZolam (XANAX) 1 MG tablet Take 1 tablet (1 mg total) by mouth 4 (four) times daily as needed. 120 tablet 2   desvenlafaxine (PRISTIQ) 50 MG 24 hr tablet Take 1 tablet (50 mg total) by mouth daily. 30 tablet 3   metoprolol succinate (TOPROL-XL) 25 MG 24 hr tablet      OLANZapine (ZYPREXA) 5 MG tablet Take 1 tablet (5 mg total) by mouth at bedtime. 30 tablet 2   rosuvastatin (CRESTOR) 5 MG tablet Take 1 tablet by mouth  daily.     EPINEPHrine 0.3 mg/0.3 mL IJ SOAJ injection INJECT 0.3 MLS (0.3 MG TOTAL) INTO THE MUSCLE ONCE FOR 1 DOSE. (Patient not taking: Reported on 06/28/2022)     famotidine (PEPCID) 20 MG tablet Take 20 mg by mouth every evening.  (Patient not taking: Reported on 06/28/2022)     pantoprazole (PROTONIX) 40 MG tablet TAKE 1 TABLET (40 MG TOTAL) BY MOUTH 2 (TWO) TIMES DAILY BEFORE A MEAL. (Patient not taking: Reported on 06/28/2022) 180 tablet 1   No current facility-administered medications for this visit.    Allergies as of 06/28/2022 - Review Complete 06/28/2022  Allergen Reaction Noted   Clonazepam Nausea And Vomiting 02/13/2013   Sulfa antibiotics  02/13/2013   Vilazodone  12/25/2013    Family History  Problem Relation Age of Onset   Bipolar disorder Mother    Depression Maternal Aunt    Depression Maternal Uncle    Urticaria Neg Hx    Immunodeficiency Neg Hx    Eczema Neg Hx    Atopy Neg Hx    Asthma Neg Hx    Angioedema Neg Hx    Allergic rhinitis Neg Hx     Social History   Socioeconomic History   Marital status: Divorced    Spouse name: Not on file   Number of children: Not   on file   Years of education: Not on file   Highest education level: Not on file  Occupational History   Not on file  Tobacco Use   Smoking status: Never   Smokeless tobacco: Never  Vaping Use   Vaping Use: Never used  Substance and Sexual Activity   Alcohol use: Yes    Comment: Rare glass of wine   Drug use: Yes    Types: Marijuana    Comment: smokes cannabis for anxiety    Sexual activity: Not Currently    Partners: Male    Birth control/protection: Pill  Other Topics Concern   Not on file  Social History Narrative   Not on file   Social Determinants of Health   Financial Resource Strain: Not on file  Food Insecurity: Not on file  Transportation Needs: Not on file  Physical Activity: Not on file  Stress: Not on file  Social Connections: Not on file  Intimate Partner  Violence: Not on file     Review of Systems   Gen: Denies any fever, chills, fatigue, weight loss, lack of appetite.  CV: Denies chest pain, heart palpitations, peripheral edema, syncope.  Resp: Denies shortness of breath at rest or with exertion. Denies wheezing or cough.  GI: see HPI GU : Denies urinary burning, urinary frequency, urinary hesitancy MS: Denies joint pain, muscle weakness, cramps, or limitation of movement.  Derm: Denies rash, itching, dry skin Psych: Denies depression, anxiety, memory loss, and confusion Heme: Denies bruising, bleeding, and enlarged lymph nodes.   Physical Exam   BP 120/72   Pulse (!) 103   Temp 98.2 F (36.8 C)   Ht 5' 3" (1.6 m)   Wt 110 lb 3.2 oz (50 kg)   BMI 19.52 kg/m  General:   Alert and oriented. Pleasant and cooperative. Well-nourished and well-developed.  Head:  Normocephalic and atraumatic. Eyes:  Without icterus Ears:  Normal auditory acuity. Lungs:  Clear to auscultation bilaterally.  Heart:  S1, S2 present without murmurs appreciated.  Abdomen:  +BS, soft, non-tender and non-distended. No HSM noted. No guarding or rebound. No masses appreciated.  Rectal:  no external hemorrhoids, soft stool in rectal vault. Somewhat lax sphincter tone Msk:  Symmetrical without gross deformities. Normal posture. Extremities:  Without edema. Neurologic:  Alert and  oriented x4;  grossly normal neurologically. Skin:  Intact without significant lesions or rashes. Psych:  Alert and cooperative. Normal mood and affect.   Assessment   Samantha Chambers is a 51 y.o. female presenting today at the request of PCP due to fecal incontinence. Last colonoscopy about 13 years ago.   Appears she has non-productive stools at times and occasionally will have incontinent episodes most often associated with urgency. Multifactorial etiologies likely in setting of possible pelvic floor dysfunction, incomplete closure due to internal hemorrhoids, lax sphincter  tone, possible overflow etiology. Will add fiber for bulking and pursue colonoscopy for routine screening purposes. I do note she is under significant amount of stressors, which she states worsens symptoms.   Dyspepsia also noted. Will start PPI and EGD at time of colonoscopy.      PLAN   Proceed with colonoscopy/EGD  by Dr. Carver  in near future: the risks, benefits, and alternatives have been discussed with the patient in detail. The patient states understanding and desires to proceed.   Benefiber daily  Protonix daily  Follow-up thereafter   Rosalin Buster W. Debbie Bellucci, PhD, ANP-BC [facility]    

## 2022-06-28 NOTE — Patient Instructions (Signed)
We are arranging a colonoscopy and upper endoscopy with Dr. Abbey Chatters in the near future.  Start taking pantoprazole once daily, 30 minutes before breakfast. I sent this to the pharmacy.  Let's try Benefiber 2 teaspoons each morning in the beverage of your choice.  I will see you in follow-up after the procedures!  It was a pleasure to see you today. I want to create trusting relationships with patients and provide genuine, compassionate, and quality care. I truly value your feedback, so please be on the lookout for a survey regarding your visit with me today. I appreciate your time in completing this!    Annitta Needs, PhD, ANP-BC [facility]

## 2022-06-28 NOTE — Progress Notes (Signed)
Gastroenterology Office Note    Referring Provider: Monico Blitz, MD Primary Care Physician:  Samantha Blitz, MD  Primary GI: Dr. Abbey Chambers    Chief Complaint   Chief Complaint  Patient presents with   New Patient (Initial Visit)    Pt referred for bowel incontinence     History of Present Illness   Samantha Chambers is a 52 y.o. female presenting today at request of PCP due to fecal incontinence. Last colonoscopy about 13 years ago.   She notes long-standing history of multiple stressors in her life including with her daughter and shared much of this today. She notes worsening symptoms of dyspepsia and bowel habit changes with stress.   States she had episode a few months ago with drinking 2 glasses of wine on an empty stomach then had fecal incontinence, watery stool. 2 weeks later ate fajitas, 2 margaritas, and had another episode.   In the morning will occasionally have fecal urgency. If doesn't make it in time, she will have incontinence. Small amount of soft stool will come out. May need several BMs in the morning that are mushy before she is done.  Notes burping, epigastric discomfort, worsening GERD symptoms. Currently no PPI. No rectal bleeding.    Past Medical History:  Diagnosis Date   Angio-edema    Anxiety    Bipolar disorder ([place])    Depression    Headache(784.0)    Irritable bowel    Urticaria     Past Surgical History:  Procedure Laterality Date   FOOT SURGERY      Current Outpatient Medications  Medication Sig Dispense Refill   ALPRAZolam (XANAX) 1 MG tablet Take 1 tablet (1 mg total) by mouth 4 (four) times daily as needed. 120 tablet 2   desvenlafaxine (PRISTIQ) 50 MG 24 hr tablet Take 1 tablet (50 mg total) by mouth daily. 30 tablet 3   metoprolol succinate (TOPROL-XL) 25 MG 24 hr tablet      OLANZapine (ZYPREXA) 5 MG tablet Take 1 tablet (5 mg total) by mouth at bedtime. 30 tablet 2   rosuvastatin (CRESTOR) 5 MG tablet Take 1 tablet by mouth  daily.     EPINEPHrine 0.3 mg/0.3 mL IJ SOAJ injection INJECT 0.3 MLS (0.3 MG TOTAL) INTO THE MUSCLE ONCE FOR 1 DOSE. (Patient not taking: Reported on 06/28/2022)     famotidine (PEPCID) 20 MG tablet Take 20 mg by mouth every evening.  (Patient not taking: Reported on 06/28/2022)     pantoprazole (PROTONIX) 40 MG tablet TAKE 1 TABLET (40 MG TOTAL) BY MOUTH 2 (TWO) TIMES DAILY BEFORE A MEAL. (Patient not taking: Reported on 06/28/2022) 180 tablet 1   No current facility-administered medications for this visit.    Allergies as of 06/28/2022 - Review Complete 06/28/2022  Allergen Reaction Noted   Clonazepam Nausea And Vomiting 02/13/2013   Sulfa antibiotics  02/13/2013   Vilazodone  12/25/2013    Family History  Problem Relation Age of Onset   Bipolar disorder Mother    Depression Maternal Aunt    Depression Maternal Uncle    Urticaria Neg Hx    Immunodeficiency Neg Hx    Eczema Neg Hx    Atopy Neg Hx    Asthma Neg Hx    Angioedema Neg Hx    Allergic rhinitis Neg Hx     Social History   Socioeconomic History   Marital status: Divorced    Spouse name: Not on file   Number of children: Not  on file   Years of education: Not on file   Highest education level: Not on file  Occupational History   Not on file  Tobacco Use   Smoking status: Never   Smokeless tobacco: Never  Vaping Use   Vaping Use: Never used  Substance and Sexual Activity   Alcohol use: Yes    Comment: Rare glass of wine   Drug use: Yes    Types: Marijuana    Comment: smokes cannabis for anxiety    Sexual activity: Not Currently    Partners: Male    Birth control/protection: Pill  Other Topics Concern   Not on file  Social History Narrative   Not on file   Social Determinants of Health   Financial Resource Strain: Not on file  Food Insecurity: Not on file  Transportation Needs: Not on file  Physical Activity: Not on file  Stress: Not on file  Social Connections: Not on file  Intimate Partner  Violence: Not on file     Review of Systems   Gen: Denies any fever, chills, fatigue, weight loss, lack of appetite.  CV: Denies chest pain, heart palpitations, peripheral edema, syncope.  Resp: Denies shortness of breath at rest or with exertion. Denies wheezing or cough.  GI: see HPI GU : Denies urinary burning, urinary frequency, urinary hesitancy MS: Denies joint pain, muscle weakness, cramps, or limitation of movement.  Derm: Denies rash, itching, dry skin Psych: Denies depression, anxiety, memory loss, and confusion Heme: Denies bruising, bleeding, and enlarged lymph nodes.   Physical Exam   BP 120/72   Pulse (!) 103   Temp 98.2 F (36.8 C)   Ht 5' 3"$  (1.6 m)   Wt 110 lb 3.2 oz (50 kg)   BMI 19.52 kg/m  General:   Alert and oriented. Pleasant and cooperative. Well-nourished and well-developed.  Head:  Normocephalic and atraumatic. Eyes:  Without icterus Ears:  Normal auditory acuity. Lungs:  Clear to auscultation bilaterally.  Heart:  S1, S2 present without murmurs appreciated.  Abdomen:  +BS, soft, non-tender and non-distended. No HSM noted. No guarding or rebound. No masses appreciated.  Rectal:  no external hemorrhoids, soft stool in rectal vault. Somewhat lax sphincter tone Msk:  Symmetrical without gross deformities. Normal posture. Extremities:  Without edema. Neurologic:  Alert and  oriented x4;  grossly normal neurologically. Skin:  Intact without significant lesions or rashes. Psych:  Alert and cooperative. Normal mood and affect.   Assessment   Samantha Chambers is a 52 y.o. female presenting today at the request of PCP due to fecal incontinence. Last colonoscopy about 13 years ago.   Appears she has non-productive stools at times and occasionally will have incontinent episodes most often associated with urgency. Multifactorial etiologies likely in setting of possible pelvic floor dysfunction, incomplete closure due to internal hemorrhoids, lax sphincter  tone, possible overflow etiology. Will add fiber for bulking and pursue colonoscopy for routine screening purposes. I do note she is under significant amount of stressors, which she states worsens symptoms.   Dyspepsia also noted. Will start PPI and EGD at time of colonoscopy.      PLAN   Proceed with colonoscopy/EGD  by Dr. Abbey Chambers  in near future: the risks, benefits, and alternatives have been discussed with the patient in detail. The patient states understanding and desires to proceed.   Benefiber daily  Protonix daily  Follow-up thereafter   Annitta Needs, PhD, ANP-BC [facility]

## 2022-06-29 ENCOUNTER — Encounter: Payer: Self-pay | Admitting: Gastroenterology

## 2022-07-02 ENCOUNTER — Telehealth (HOSPITAL_COMMUNITY): Payer: Self-pay

## 2022-07-02 ENCOUNTER — Other Ambulatory Visit (HOSPITAL_COMMUNITY): Payer: Self-pay | Admitting: Psychiatry

## 2022-07-02 MED ORDER — DESVENLAFAXINE SUCCINATE ER 50 MG PO TB24: 50.0000 mg | ORAL_TABLET | Freq: Every day | ORAL | 0 refills | Status: DC

## 2022-07-02 MED ORDER — DIAZEPAM 5 MG PO TABS: 5.0000 mg | ORAL_TABLET | Freq: Four times a day (QID) | ORAL | 0 refills | Status: AC | PRN

## 2022-07-02 NOTE — Telephone Encounter (Signed)
Spoke with pt advised medication sent to pharmacy she verbalized understanding

## 2022-07-02 NOTE — Telephone Encounter (Signed)
Medication problem - Telephone call with patient who stated she had an "incident" with her daughter over the weekend where the daughter had thrown away and flushed several of the daughter's and her medications as she had locked herself in their bathroom.  Patient stated they had just picked up medication refills and patient took the medications in their bathroom after she got upset at the store when caught trying to buy a vap.  Patient stated she was now approximately 5-10 pills short on Pristiq and 1/2 her prescription for Alprazolam.  Patient stated concern she could not simply refill these medications due to insurance coverage and wanted to know if perhaps Dr. Harrington Challenger would substitute a partial prescription of Valium to supplement the shortage of Alprazolam and not sure what to do with being short on Pristiq.  Agreed to send problem to Dr. Harrington Challenger to see what could be done if anything as patient stated her daughter was fine now, that they had referred her to start with a new DBT therapist in the coming week and in no danger at this time to them or to herself.

## 2022-07-03 DIAGNOSIS — F319 Bipolar disorder, unspecified: Secondary | ICD-10-CM | POA: Diagnosis not present

## 2022-07-05 DIAGNOSIS — H5203 Hypermetropia, bilateral: Secondary | ICD-10-CM | POA: Diagnosis not present

## 2022-07-05 DIAGNOSIS — H524 Presbyopia: Secondary | ICD-10-CM | POA: Diagnosis not present

## 2022-07-05 DIAGNOSIS — H52223 Regular astigmatism, bilateral: Secondary | ICD-10-CM | POA: Diagnosis not present

## 2022-07-09 DIAGNOSIS — F319 Bipolar disorder, unspecified: Secondary | ICD-10-CM | POA: Diagnosis not present

## 2022-07-10 DIAGNOSIS — Z114 Encounter for screening for human immunodeficiency virus [HIV]: Secondary | ICD-10-CM | POA: Diagnosis not present

## 2022-07-10 DIAGNOSIS — N761 Subacute and chronic vaginitis: Secondary | ICD-10-CM | POA: Diagnosis not present

## 2022-07-10 DIAGNOSIS — Z113 Encounter for screening for infections with a predominantly sexual mode of transmission: Secondary | ICD-10-CM | POA: Diagnosis not present

## 2022-07-10 DIAGNOSIS — Z1159 Encounter for screening for other viral diseases: Secondary | ICD-10-CM | POA: Diagnosis not present

## 2022-07-10 DIAGNOSIS — Z7289 Other problems related to lifestyle: Secondary | ICD-10-CM | POA: Diagnosis not present

## 2022-07-16 ENCOUNTER — Encounter: Payer: Self-pay | Admitting: *Deleted

## 2022-07-16 ENCOUNTER — Telehealth: Payer: Self-pay | Admitting: *Deleted

## 2022-07-16 ENCOUNTER — Ambulatory Visit: Payer: Medicare HMO | Admitting: Gastroenterology

## 2022-07-16 NOTE — Telephone Encounter (Signed)
Pt called and states she has procedure scheduled for tomorrow and would like something different besides the "jug". She says she has started her clear liquid diet since yesterday.   Advised pt can do mira lax prep. She says she would prefer to do that. Instructions sent to pt via MyChart.

## 2022-07-17 ENCOUNTER — Encounter (HOSPITAL_COMMUNITY): Payer: Self-pay

## 2022-07-17 ENCOUNTER — Other Ambulatory Visit: Payer: Self-pay

## 2022-07-17 ENCOUNTER — Encounter (HOSPITAL_COMMUNITY)
Admission: RE | Disposition: A | Discharge status: Home / Self Care | Payer: Self-pay | Source: Home / Self Care | Attending: Internal Medicine

## 2022-07-17 ENCOUNTER — Ambulatory Visit (HOSPITAL_COMMUNITY)
Admission: RE | Admit: 2022-07-17 | Discharge: 2022-07-17 | Disposition: A | Discharge status: Home / Self Care | Payer: Medicare HMO | Attending: Internal Medicine | Admitting: Internal Medicine

## 2022-07-17 ENCOUNTER — Ambulatory Visit (HOSPITAL_COMMUNITY): Payer: Medicare HMO | Admitting: Anesthesiology

## 2022-07-17 ENCOUNTER — Ambulatory Visit (HOSPITAL_BASED_OUTPATIENT_CLINIC_OR_DEPARTMENT_OTHER): Payer: Medicare HMO | Admitting: Anesthesiology

## 2022-07-17 DIAGNOSIS — K2970 Gastritis, unspecified, without bleeding: Secondary | ICD-10-CM | POA: Diagnosis not present

## 2022-07-17 DIAGNOSIS — F319 Bipolar disorder, unspecified: Secondary | ICD-10-CM | POA: Diagnosis not present

## 2022-07-17 DIAGNOSIS — K30 Functional dyspepsia: Secondary | ICD-10-CM | POA: Diagnosis not present

## 2022-07-17 DIAGNOSIS — D124 Benign neoplasm of descending colon: Secondary | ICD-10-CM

## 2022-07-17 DIAGNOSIS — K449 Diaphragmatic hernia without obstruction or gangrene: Secondary | ICD-10-CM | POA: Diagnosis not present

## 2022-07-17 DIAGNOSIS — K2270 Barrett's esophagus without dysplasia: Secondary | ICD-10-CM | POA: Diagnosis not present

## 2022-07-17 DIAGNOSIS — F419 Anxiety disorder, unspecified: Secondary | ICD-10-CM | POA: Diagnosis not present

## 2022-07-17 DIAGNOSIS — K648 Other hemorrhoids: Secondary | ICD-10-CM

## 2022-07-17 DIAGNOSIS — R1310 Dysphagia, unspecified: Secondary | ICD-10-CM | POA: Diagnosis not present

## 2022-07-17 DIAGNOSIS — Z1211 Encounter for screening for malignant neoplasm of colon: Secondary | ICD-10-CM

## 2022-07-17 DIAGNOSIS — Z139 Encounter for screening, unspecified: Secondary | ICD-10-CM | POA: Diagnosis not present

## 2022-07-17 DIAGNOSIS — F418 Other specified anxiety disorders: Secondary | ICD-10-CM | POA: Diagnosis not present

## 2022-07-17 DIAGNOSIS — K635 Polyp of colon: Secondary | ICD-10-CM | POA: Diagnosis not present

## 2022-07-17 HISTORY — PX: BIOPSY: SHX5522

## 2022-07-17 HISTORY — PX: COLONOSCOPY WITH PROPOFOL: SHX5780

## 2022-07-17 HISTORY — PX: ESOPHAGOGASTRODUODENOSCOPY (EGD) WITH PROPOFOL: SHX5813

## 2022-07-17 SURGERY — COLONOSCOPY WITH PROPOFOL
Anesthesia: General

## 2022-07-17 MED ORDER — LACTATED RINGERS IV SOLN: INTRAVENOUS | Status: DC | PRN

## 2022-07-17 MED ORDER — PROPOFOL 10 MG/ML IV BOLUS
INTRAVENOUS | Status: DC | PRN
  Administered 2022-07-17: 70 mg via INTRAVENOUS
  Administered 2022-07-17: 50 mg via INTRAVENOUS

## 2022-07-17 MED ORDER — LIDOCAINE HCL (CARDIAC) PF 100 MG/5ML IV SOSY
PREFILLED_SYRINGE | INTRAVENOUS | Status: DC | PRN
  Administered 2022-07-17: 50 mg via INTRAVENOUS

## 2022-07-17 MED ORDER — PROPOFOL 500 MG/50ML IV EMUL
INTRAVENOUS | Status: DC | PRN
  Administered 2022-07-17: 175 ug/kg/min via INTRAVENOUS

## 2022-07-17 MED ORDER — PANTOPRAZOLE SODIUM 40 MG PO TBEC: 40.0000 mg | DELAYED_RELEASE_TABLET | Freq: Two times a day (BID) | ORAL | 11 refills | Status: DC

## 2022-07-17 MED ORDER — LACTATED RINGERS IV SOLN: INTRAVENOUS | Status: DC

## 2022-07-17 MED ORDER — PHENYLEPHRINE 80 MCG/ML (10ML) SYRINGE FOR IV PUSH (FOR BLOOD PRESSURE SUPPORT)
PREFILLED_SYRINGE | INTRAVENOUS | Status: DC | PRN
  Administered 2022-07-17 (×2): 80 ug via INTRAVENOUS

## 2022-07-17 NOTE — Interval H&P Note (Signed)
History and Physical Interval Note:  07/17/2022 9:53 AM  Samantha Chambers  has presented today for surgery, with the diagnosis of screening,dyspepsia.  The various methods of treatment have been discussed with the patient and family. After consideration of risks, benefits and other options for treatment, the patient has consented to  Procedure(s) with comments: COLONOSCOPY WITH PROPOFOL (N/A) - 11:30 am, asa 2, pt can't come earlier ESOPHAGOGASTRODUODENOSCOPY (EGD) WITH PROPOFOL (N/A) as a surgical intervention.  The patient's history has been reviewed, patient examined, no change in status, stable for surgery.  I have reviewed the patient's chart and labs.  Questions were answered to the patient's satisfaction.     Eloise Harman

## 2022-07-17 NOTE — Transfer of Care (Addendum)
Immediate Anesthesia Transfer of Care Note  Patient: Samantha Chambers  Procedure(s) Performed: COLONOSCOPY WITH PROPOFOL ESOPHAGOGASTRODUODENOSCOPY (EGD) WITH PROPOFOL BIOPSY  Patient Location: Endoscopy Unit  Anesthesia Type:General  Level of Consciousness: awake and patient cooperative  Airway & Oxygen Therapy: Patient Spontanous Breathing  Post-op Assessment: Report given to RN and Post -op Vital signs reviewed and stable  Post vital signs: Reviewed and stable  Last Vitals:  Vitals Value Taken Time  BP 116/73 1118  Temp 36.5 1119  Pulse 110 1119  Resp 18 1119  SpO2 100 1119    Last Pain:  Vitals:   07/17/22 1112  TempSrc: Oral  PainSc: 0-No pain      Patients Stated Pain Goal: 5 ([id number])  Complications: No notable events documented.

## 2022-07-17 NOTE — Op Note (Signed)
[facility] Patient Name: Samantha Chambers Procedure Date: 07/17/2022 10:47 AM MRN: [id number] Date of Birth: [birth year] Attending MD: Elon Alas. Abbey Chatters , [place], [id number] CSN: [id number] Age: 52 Admit Type: Outpatient Procedure:                Colonoscopy Indications:              Screening for colorectal malignant neoplasm Providers:                Elon Alas. Abbey Chatters, DO, Selena Lesser RN, RN,                            Randa Spike, Technician Referring MD:              Medicines:                See the Anesthesia note for documentation of the                            administered medications Complications:            No immediate complications. Estimated Blood Loss:     Estimated blood loss was minimal. Procedure:                Pre-Anesthesia Assessment:                           - The anesthesia plan was to use monitored                            anesthesia care (MAC).                           After obtaining informed consent, the colonoscope                            was passed under direct vision. Throughout the                            procedure, the patient's blood pressure, pulse, and                            oxygen saturations were monitored continuously. The                            [id number]) was introduced through the                            anus and advanced to the the cecum, identified by                            appendiceal orifice and ileocecal valve. The                            colonoscopy was performed without difficulty. The                            patient tolerated the procedure well.  The quality                            of the bowel preparation was evaluated using the                            BBPS [facility] Bowel Preparation Scale) with scores                            of: Right Colon = 2 (minor amount of residual                            staining, small fragments of stool and/or opaque                            liquid,  but mucosa seen well), Transverse Colon = 3                            (entire mucosa seen well with no residual staining,                            small fragments of stool or opaque liquid) and Left                            Colon = 3 (entire mucosa seen well with no residual                            staining, small fragments of stool or opaque                            liquid). The total BBPS score equals 8. The quality                            of the bowel preparation was good. Scope In: 10:50:58 AM Scope Out: 11:09:21 AM Scope Withdrawal Time: 0 hours 16 minutes 28 seconds  Total Procedure Duration: 0 hours 18 minutes 23 seconds  Findings:      Non-bleeding internal hemorrhoids were found during endoscopy.      A 5 mm polyp was found in the descending colon. The polyp was sessile.       The polyp was removed with a cold snare. Resection and retrieval were       complete.      The exam was otherwise without abnormality. Impression:               - Non-bleeding internal hemorrhoids.                           - One 5 mm polyp in the descending colon, removed                            with a cold snare. Resected and retrieved.                           -  The examination was otherwise normal. Moderate Sedation:      Per Anesthesia Care Recommendation:           - Patient has a contact number available for                            emergencies. The signs and symptoms of potential                            delayed complications were discussed with the                            patient. Return to normal activities tomorrow.                            Written discharge instructions were provided to the                            patient.                           - Resume previous diet.                           - Continue present medications.                           - Await pathology results.                           - Repeat colonoscopy in 7 years for surveillance.                            - Return to GI clinic in 3 months. Procedure Code(s):        --- Professional ---                           [phone], Colonoscopy, flexible; with removal of                            tumor(s), polyp(s), or other lesion(s) by snare                            technique Diagnosis Code(s):        --- Professional ---                           Z12.11, Encounter for screening for malignant                            neoplasm of colon                           D12.4, Benign neoplasm of descending colon                           K64.8, Other hemorrhoids CPT copyright 2022 American  Medical Association. All rights reserved. The codes documented in this report are preliminary and upon coder review may  be revised to meet current compliance requirements. Elon Alas. Abbey Chatters, DO Gene Autry Abbey Chatters, DO 07/17/2022 11:12:33 AM This report has been signed electronically. Number of Addenda: 0

## 2022-07-17 NOTE — Op Note (Signed)
[facility] Patient Name: Samantha Chambers Procedure Date: 07/17/2022 10:33 AM MRN: [id number] Date of Birth: 1970-11-23 Attending MD: Elon Alas. Abbey Chatters , [place], [id number] CSN: [id number] Age: 52 Admit Type: Outpatient Procedure:                Upper GI endoscopy Indications:              Functional Dyspepsia Providers:                Elon Alas. Abbey Chatters, DO, Charlsie Quest. Theda Sers RN, RN,                            Randa Spike, Technician Referring MD:              Medicines:                See the Anesthesia note for documentation of the                            administered medications Complications:            No immediate complications. Estimated Blood Loss:     Estimated blood loss was minimal. Procedure:                Pre-Anesthesia Assessment:                           - The anesthesia plan was to use monitored                            anesthesia care (MAC).                           After obtaining informed consent, the endoscope was                            passed under direct vision. Throughout the                            procedure, the patient's blood pressure, pulse, and                            oxygen saturations were monitored continuously. The                            [id number] scope was introduced through the                            mouth, and advanced to the second part of duodenum.                            The upper GI endoscopy was accomplished without                            difficulty. The patient tolerated the procedure                            well. Scope In:  10:41:24 AM Scope Out: 10:46:31 AM Total Procedure Duration: 0 hours 5 minutes 7 seconds  Findings:      A small hiatal hernia was present.      Patchy mild inflammation characterized by erythema was found in the       gastric body. Biopsies were taken with a cold forceps for Helicobacter       pylori testing.      The duodenal bulb, first portion of the duodenum and  second portion of       the duodenum were normal.      There were esophageal mucosal changes classified as Barrett's present at       the gastroesophageal junction. Multiple small islands. The maximum       longitudinal extent of these mucosal changes was 1 cm in length. Mucosa       was biopsied with a cold forceps for histology. One specimen bottle was       sent to pathology. Impression:               - Small hiatal hernia.                           - Gastritis. Biopsied.                           - Normal duodenal bulb, first portion of the                            duodenum and second portion of the duodenum.                           - Esophageal mucosal changes classified as                            Barrett's stage C0-M1 per Prague criteria. Biopsied. Moderate Sedation:      Per Anesthesia Care Recommendation:           - Patient has a contact number available for                            emergencies. The signs and symptoms of potential                            delayed complications were discussed with the                            patient. Return to normal activities tomorrow.                            Written discharge instructions were provided to the                            patient.                           - Resume previous diet.                           -  Continue present medications.                           - Await pathology results.                           - Repeat upper endoscopy in 5 years for                            surveillance if biopsies conmfirm Barrett's                           - Return to GI clinic in 3 months. Procedure Code(s):        --- Professional ---                           [phone], Esophagogastroduodenoscopy, flexible,                            transoral; with biopsy, single or multiple Diagnosis Code(s):        --- Professional ---                           K44.9, Diaphragmatic hernia without obstruction or                             gangrene                           K29.70, Gastritis, unspecified, without bleeding                           K22.70, Barrett's esophagus without dysplasia                           K30, Functional dyspepsia CPT copyright 2022 American Medical Association. All rights reserved. The codes documented in this report are preliminary and upon coder review may  be revised to meet current compliance requirements. Elon Alas. Abbey Chatters, DO An[place] Abbey Chatters, DO 07/17/2022 10:50:05 AM This report has been signed electronically. Number of Addenda: 0

## 2022-07-17 NOTE — Discharge Instructions (Signed)
EGD Discharge instructions Please read the instructions outlined below and refer to this sheet in the next few weeks. These discharge instructions provide you with general information on caring for yourself after you leave the hospital. Your doctor may also give you specific instructions. While your treatment has been planned according to the most current medical practices available, unavoidable complications occasionally occur. If you have any problems or questions after discharge, please call your doctor. ACTIVITY You may resume your regular activity but move at a slower pace for the next 24 hours.  Take frequent rest periods for the next 24 hours.  Walking will help expel (get rid of) the air and reduce the bloated feeling in your abdomen.  No driving for 24 hours (because of the anesthesia (medicine) used during the test).  You may shower.  Do not sign any important legal documents or operate any machinery for 24 hours (because of the anesthesia used during the test).  NUTRITION Drink plenty of fluids.  You may resume your normal diet.  Begin with a light meal and progress to your normal diet.  Avoid alcoholic beverages for 24 hours or as instructed by your caregiver.  MEDICATIONS You may resume your normal medications unless your caregiver tells you otherwise.  WHAT YOU CAN EXPECT TODAY You may experience abdominal discomfort such as a feeling of fullness or "gas" pains.  FOLLOW-UP Your doctor will discuss the results of your test with you.  SEEK IMMEDIATE MEDICAL ATTENTION IF ANY OF THE FOLLOWING OCCUR: Excessive nausea (feeling sick to your stomach) and/or vomiting.  Severe abdominal pain and distention (swelling).  Trouble swallowing.  Temperature over 101 F (37.8 C).  Rectal bleeding or vomiting of blood.    Colonoscopy Discharge Instructions  Read the instructions outlined below and refer to this sheet in the next few weeks. These discharge instructions provide you with  general information on caring for yourself after you leave the hospital. Your doctor may also give you specific instructions. While your treatment has been planned according to the most current medical practices available, unavoidable complications occasionally occur.   ACTIVITY You may resume your regular activity, but move at a slower pace for the next 24 hours.  Take frequent rest periods for the next 24 hours.  Walking will help get rid of the air and reduce the bloated feeling in your belly (abdomen).  No driving for 24 hours (because of the medicine (anesthesia) used during the test).   Do not sign any important legal documents or operate any machinery for 24 hours (because of the anesthesia used during the test).  NUTRITION Drink plenty of fluids.  You may resume your normal diet as instructed by your doctor.  Begin with a light meal and progress to your normal diet. Heavy or fried foods are harder to digest and may make you feel sick to your stomach (nauseated).  Avoid alcoholic beverages for 24 hours or as instructed.  MEDICATIONS You may resume your normal medications unless your doctor tells you otherwise.  WHAT YOU CAN EXPECT TODAY Some feelings of bloating in the abdomen.  Passage of more gas than usual.  Spotting of blood in your stool or on the toilet paper.  IF YOU HAD POLYPS REMOVED DURING THE COLONOSCOPY: No aspirin products for 7 days or as instructed.  No alcohol for 7 days or as instructed.  Eat a soft diet for the next 24 hours.  FINDING OUT THE RESULTS OF YOUR TEST Not all test results are available  during your visit. If your test results are not back during the visit, make an appointment with your caregiver to find out the results. Do not assume everything is normal if you have not heard from your caregiver or the medical facility. It is important for you to follow up on all of your test results.  SEEK IMMEDIATE MEDICAL ATTENTION IF: You have more than a spotting of  blood in your stool.  Your belly is swollen (abdominal distention).  You are nauseated or vomiting.  You have a temperature over 101.  You have abdominal pain or discomfort that is severe or gets worse throughout the day.   Your EGD revealed mild amount inflammation in your stomach.  I took biopsies of this to rule out infection with a bacteria called H. pylori.  Await pathology results, my office will contact you.  Small bowel appeared normal.  Continue on pantoprazole daily.  I also took biopsies of your esophagus to rule out a condition called Barrett's esophagus.  Small hiatal hernia noted.  Overall your colon looked very healthy.  I did not see any active inflammation indicative of underlying Crohn's disease or ulcerative colitis.  You did have 1 polyp which I removed.  Recommend repeat colonoscopy 7 years.  Follow-up with GI in 2 to 3 months.  I hope you have a great rest of your week!  Elon Alas. Abbey Chatters, D.O. Gastroenterology and Hepatology [facility]

## 2022-07-17 NOTE — Anesthesia Postprocedure Evaluation (Signed)
Anesthesia Post Note  Patient: Samantha Chambers  Procedure(s) Performed: COLONOSCOPY WITH PROPOFOL ESOPHAGOGASTRODUODENOSCOPY (EGD) WITH PROPOFOL BIOPSY  Patient location during evaluation: Phase II Anesthesia Type: General Level of consciousness: awake Pain management: pain level controlled Vital Signs Assessment: post-procedure vital signs reviewed and stable Respiratory status: spontaneous breathing and respiratory function stable Cardiovascular status: blood pressure returned to baseline and stable Postop Assessment: no headache and no apparent nausea or vomiting Anesthetic complications: no Comments: Late entry   No notable events documented.   Last Vitals:  Vitals:   07/17/22 1112 07/17/22 1118  BP:  116/73  Pulse: (!) 110   Resp: 18   Temp: 36.5 C   SpO2: 100%     Last Pain:  Vitals:   07/17/22 1112  TempSrc: Oral  PainSc: 0-No pain                 Louann Sjogren

## 2022-07-17 NOTE — Anesthesia Preprocedure Evaluation (Signed)
Anesthesia Evaluation  Patient identified by MRN, date of birth, ID band Patient awake    Reviewed: Allergy & Precautions, H&P , NPO status , Patient's Chart, lab work & pertinent test results, reviewed documented beta blocker date and time   Airway Mallampati: II  TM Distance: >3 FB Neck ROM: full    Dental no notable dental hx.    Pulmonary neg pulmonary ROS   Pulmonary exam normal breath sounds clear to auscultation       Cardiovascular Exercise Tolerance: Good negative cardio ROS  Rhythm:regular Rate:Normal     Neuro/Psych  Headaches PSYCHIATRIC DISORDERS Anxiety Depression Bipolar Disorder   negative neurological ROS  negative psych ROS   GI/Hepatic negative GI ROS, Neg liver ROS,GERD  ,,  Endo/Other  negative endocrine ROS    Renal/GU negative Renal ROS  negative genitourinary   Musculoskeletal   Abdominal   Peds  Hematology negative hematology ROS (+)   Anesthesia Other Findings   Reproductive/Obstetrics negative OB ROS                             Anesthesia Physical Anesthesia Plan  ASA: 2  Anesthesia Plan: General   Post-op Pain Management:    Induction:   PONV Risk Score and Plan: Propofol infusion  Airway Management Planned:   Additional Equipment:   Intra-op Plan:   Post-operative Plan:   Informed Consent: I have reviewed the patients History and Physical, chart, labs and discussed the procedure including the risks, benefits and alternatives for the proposed anesthesia with the patient or authorized representative who has indicated his/her understanding and acceptance.     Dental Advisory Given  Plan Discussed with: CRNA  Anesthesia Plan Comments:        Anesthesia Quick Evaluation

## 2022-07-19 LAB — SURGICAL PATHOLOGY

## 2022-07-26 ENCOUNTER — Encounter (HOSPITAL_COMMUNITY): Payer: Self-pay | Admitting: Internal Medicine

## 2022-07-30 DIAGNOSIS — F319 Bipolar disorder, unspecified: Secondary | ICD-10-CM | POA: Diagnosis not present

## 2022-07-31 ENCOUNTER — Telehealth (INDEPENDENT_AMBULATORY_CARE_PROVIDER_SITE_OTHER): Payer: Medicare HMO | Admitting: Psychiatry

## 2022-07-31 ENCOUNTER — Other Ambulatory Visit (HOSPITAL_COMMUNITY): Payer: Self-pay | Admitting: Psychiatry

## 2022-07-31 ENCOUNTER — Encounter (HOSPITAL_COMMUNITY): Payer: Self-pay | Admitting: Psychiatry

## 2022-07-31 DIAGNOSIS — F900 Attention-deficit hyperactivity disorder, predominantly inattentive type: Secondary | ICD-10-CM

## 2022-07-31 DIAGNOSIS — F411 Generalized anxiety disorder: Secondary | ICD-10-CM | POA: Diagnosis not present

## 2022-07-31 DIAGNOSIS — F3132 Bipolar disorder, current episode depressed, moderate: Secondary | ICD-10-CM | POA: Diagnosis not present

## 2022-07-31 MED ORDER — ALPRAZOLAM 1 MG PO TABS: 1.0000 mg | ORAL_TABLET | Freq: Four times a day (QID) | ORAL | 2 refills | Status: DC | PRN

## 2022-07-31 MED ORDER — OLANZAPINE 5 MG PO TABS: 5.0000 mg | ORAL_TABLET | Freq: Every day | ORAL | 2 refills | Status: DC

## 2022-07-31 MED ORDER — DESVENLAFAXINE SUCCINATE ER 50 MG PO TB24: 50.0000 mg | ORAL_TABLET | Freq: Every day | ORAL | 2 refills | Status: DC

## 2022-07-31 NOTE — Progress Notes (Signed)
Virtual Visit via Video Note  I connected with Samantha Chambers on 07/31/22 at  1:00 PM EDT by a video enabled telemedicine application and verified that I am speaking with the correct person using two identifiers.  Location: Patient: home Provider: office   I discussed the limitations of evaluation and management by telemedicine and the availability of in person appointments. The patient expressed understanding and agreed to proceed.      I discussed the assessment and treatment plan with the patient. The patient was provided an opportunity to ask questions and all were answered. The patient agreed with the plan and demonstrated an understanding of the instructions.   The patient was advised to call back or seek an in-person evaluation if the symptoms worsen or if the condition fails to improve as anticipated.  I provided  minutes of non-face-to-face time during this encounter.   [name], MD  [facility] MD/PA/NP OP Progress Note  07/31/2022 1:25 PM Samantha Chambers  MRN:  [id number]  Chief Complaint:  Chief Complaint  Patient presents with   Anxiety   Depression   Manic Behavior   Follow-up   HPI: This patient is a 52 year old divorced white female who lives with her teenage daughter in [place].  She is on disability.  The patient returns for follow-up after 2 weeks regarding her depression anxiety and possible bipolar disorder.  She states for the most part she is doing okay.  She is trying to do more to take care of herself.  She had colonoscopy and endoscopy which showed some gastritis and she is now on Protonix.  She is starting to eat more and states that today she is up to 109 pounds.  She is going to yoga classes.  She and her teenage daughter continue to argue and the daughter does not really listen to her and "crashes her room."  She states that once she starts driving she can use the car as leverage.  She does think overall her daughter is doing better.  She is sleeping well  and does not endorse any manic symptoms such as racing thoughts although she has been more active recently.  The activity seem to be focused at doing things to help herself.  The Xanax continues to help her anxiety. Visit Diagnosis:    ICD-10-CM   1. Bipolar 1 disorder, depressed, moderate ([place])  F31.32     2. Generalized anxiety disorder  F41.1     3. Attention deficit hyperactivity disorder (ADHD), predominantly inattentive type  F90.0       Past Psychiatric History: Long-term outpatient treatment  Past Medical History:  Past Medical History:  Diagnosis Date   Angio-edema    Anxiety    Bipolar disorder ([place])    Depression    Headache(784.0)    Irritable bowel    Urticaria     Past Surgical History:  Procedure Laterality Date   BIOPSY  07/17/2022   Procedure: BIOPSY;  Surgeon: Eloise Harman, DO;  [facility];  Service: Endoscopy;;   COLONOSCOPY WITH PROPOFOL N/A 07/17/2022   Procedure: COLONOSCOPY WITH PROPOFOL;  Surgeon: Eloise Harman, DO;  [facility];  Service: Endoscopy;  Laterality: N/A;  11:30 am, asa 2, pt can't come earlier   ESOPHAGOGASTRODUODENOSCOPY (EGD) WITH PROPOFOL N/A 07/17/2022   Procedure: ESOPHAGOGASTRODUODENOSCOPY (EGD) WITH PROPOFOL;  Surgeon: Eloise Harman, DO;  [facility];  Service: Endoscopy;  Laterality: N/A;   FOOT SURGERY  Family Psychiatric History: See below  Family History:  Family History  Problem Relation Age of Onset   Bipolar disorder Mother    Depression Maternal Aunt    Depression Maternal Uncle    Urticaria Neg Hx    Immunodeficiency Neg Hx    Eczema Neg Hx    Atopy Neg Hx    Asthma Neg Hx    Angioedema Neg Hx    Allergic rhinitis Neg Hx     Social History:  Social History   Socioeconomic History   Marital status: Divorced    Spouse name: Not on file   Number of children: Not on file   Years of education: Not on file   Highest education level: Not on file  Occupational  History   Not on file  Tobacco Use   Smoking status: Never   Smokeless tobacco: Never  Vaping Use   Vaping Use: Never used  Substance and Sexual Activity   Alcohol use: Yes    Comment: Rare glass of wine   Drug use: Yes    Types: Marijuana    Comment: smokes cannabis for anxiety    Sexual activity: Not Currently    Partners: Male    Birth control/protection: Pill  Other Topics Concern   Not on file  Social History Narrative   Not on file   Social Determinants of Health   Financial Resource Strain: Not on file  Food Insecurity: Not on file  Transportation Needs: Not on file  Physical Activity: Not on file  Stress: Not on file  Social Connections: Not on file    Allergies:  Allergies  Allergen Reactions   Voltaren [Diclofenac] Anaphylaxis, Hives and Swelling   Clonazepam Nausea And Vomiting    Migraine   Sulfa Antibiotics Hives   Viibryd [Vilazodone Hcl]     Stomach issues     Metabolic Disorder Labs: No results found for: "HGBA1C", "MPG" No results found for: "PROLACTIN" No results found for: "CHOL", "TRIG", "HDL", "CHOLHDL", "VLDL", "LDLCALC" No results found for: "TSH"  Therapeutic Level Labs: No results found for: "LITHIUM" Lab Results  Component Value Date   VALPROATE 33.8 (L) 03/13/2013   No results found for: "CBMZ"  Current Medications: Current Outpatient Medications  Medication Sig Dispense Refill   ALPRAZolam (XANAX) 1 MG tablet Take 1 tablet (1 mg total) by mouth 4 (four) times daily as needed. 120 tablet 2   desvenlafaxine (PRISTIQ) 50 MG 24 hr tablet Take 1 tablet (50 mg total) by mouth daily. (Patient not taking: Reported on 07/13/2022) 30 tablet 3   desvenlafaxine (PRISTIQ) 50 MG 24 hr tablet Take 1 tablet (50 mg total) by mouth daily. 30 tablet 2   diazepam (VALIUM) 5 MG tablet Take 1 tablet (5 mg total) by mouth 4 (four) times daily as needed for anxiety. 60 tablet 0   metoprolol succinate (TOPROL-XL) 25 MG 24 hr tablet Take 12.5 mg by mouth  daily.     OLANZapine (ZYPREXA) 5 MG tablet Take 1 tablet (5 mg total) by mouth at bedtime. 30 tablet 2   pantoprazole (PROTONIX) 40 MG tablet Take 1 tablet (40 mg total) by mouth 2 (two) times daily before a meal. 60 tablet 11   progesterone (PROMETRIUM) 200 MG capsule Take 200 mg by mouth at bedtime.     rosuvastatin (CRESTOR) 5 MG tablet Take 5 mg by mouth every evening.     valACYclovir (VALTREX) 500 MG tablet Take 500 mg by mouth daily.     No  current facility-administered medications for this visit.     Musculoskeletal: Strength & Muscle Tone: within normal limits Gait & Station: normal Patient leans: N/A  Psychiatric Specialty Exam: Review of Systems  All other systems reviewed and are negative.   There were no vitals taken for this visit.There is no height or weight on file to calculate BMI.  General Appearance: Casual, Neat, and Well Groomed  Eye Contact:  Good  Speech:  Clear and Coherent  Volume:  Normal  Mood:  Euthymic  Affect:  Congruent  Thought Process:  Goal Directed  Orientation:  Full (Time, Place, and Person)  Thought Content: Rumination   Suicidal Thoughts:  No  Homicidal Thoughts:  No  Memory:  Immediate;   Good Recent;   Good Remote;   Good  Judgement:  Good  Insight:  Good  Psychomotor Activity:  Normal  Concentration:  Concentration: Good and Attention Span: Good  Recall:  Good  Fund of Knowledge: Good  Language: Good  Akathisia:  No  Handed:  Right  AIMS (if indicated): not done  Assets:  Communication Skills Desire for Improvement Physical Health Resilience Social Support Talents/Skills  ADL's:  Intact  Cognition: WNL  Sleep:  Good   Screenings: MDI    Flowsheet Row Office Visit from 11/22/2015 in [facility] at [place]  Total Score (max 50) 38      PHQ2-9    Flowsheet Row Counselor from 02/01/2022 in [place] Most recent reading at 02/05/2022  2:58 PM Video Visit from  02/05/2022 in [place] at [place] Most recent reading at 02/05/2022  1:31 PM Video Visit from 01/04/2022 in [place] at [place] Most recent reading at 01/04/2022  2:35 PM Video Visit from 11/06/2021 in [place] at [place] Most recent reading at 11/06/2021 10:40 AM Video Visit from 09/12/2021 in [place] at [place] Most recent reading at 09/12/2021  4:08 PM  PHQ-2 Total Score 6 4 5 1 4   PHQ-9 Total Score 25 15 10  -- 11      Flowsheet Row Admission (Discharged) from 07/17/2022 in [place] Most recent reading at 07/17/2022  9:41 AM Counselor from 02/01/2022 in [place] Most recent reading at 02/05/2022  2:58 PM Video Visit from 02/05/2022 in [place] at [place] Most recent reading at 02/05/2022  1:31 PM  C-SSRS RISK CATEGORY No Risk Error: Question 6 not populated Error: Q3, 4, or 5 should not be populated when Q2 is No        Assessment and Plan: This patient is a 52 year old female with a history of bipolar disorder and anxiety.  She seems to be more stable.  She will continue Pristiq 50 mg daily for depression and anxiety, olanzapine 5 mg at bedtime for mood stabilization and Xanax 1 mg 4 times daily for anxiety.  She will return to see me in 2 months  Collaboration of Care: Collaboration of Care: Primary Care Provider AEB notes will be shared with PCP at patient's request  Patient/Guardian was advised Release of Information must be obtained prior to any record release in order to collaborate their care with an outside provider. Patient/Guardian was advised if they have not already done so to contact the registration department to sign all necessary forms in order for [place] to release information regarding their care.   Consent: Patient/Guardian gives verbal consent for treatment and assignment of  benefits for services provided during this visit. Patient/Guardian expressed understanding and agreed to proceed.    [name], MD 07/31/2022, 1:25 PM

## 2022-08-04 DIAGNOSIS — I10 Essential (primary) hypertension: Secondary | ICD-10-CM | POA: Diagnosis not present

## 2022-08-15 DIAGNOSIS — F319 Bipolar disorder, unspecified: Secondary | ICD-10-CM | POA: Diagnosis not present

## 2022-08-20 DIAGNOSIS — M7542 Impingement syndrome of left shoulder: Secondary | ICD-10-CM | POA: Diagnosis not present

## 2022-08-23 DIAGNOSIS — F319 Bipolar disorder, unspecified: Secondary | ICD-10-CM | POA: Diagnosis not present

## 2022-08-30 ENCOUNTER — Encounter: Payer: Self-pay | Admitting: Gastroenterology

## 2022-08-30 ENCOUNTER — Encounter: Payer: Self-pay | Admitting: Internal Medicine

## 2022-08-30 DIAGNOSIS — F319 Bipolar disorder, unspecified: Secondary | ICD-10-CM | POA: Diagnosis not present

## 2022-09-05 DIAGNOSIS — M7542 Impingement syndrome of left shoulder: Secondary | ICD-10-CM | POA: Diagnosis not present

## 2022-09-06 DIAGNOSIS — F4321 Adjustment disorder with depressed mood: Secondary | ICD-10-CM | POA: Diagnosis not present

## 2022-09-06 DIAGNOSIS — F319 Bipolar disorder, unspecified: Secondary | ICD-10-CM | POA: Diagnosis not present

## 2022-09-11 DIAGNOSIS — M7542 Impingement syndrome of left shoulder: Secondary | ICD-10-CM | POA: Diagnosis not present

## 2022-09-13 DIAGNOSIS — L658 Other specified nonscarring hair loss: Secondary | ICD-10-CM | POA: Diagnosis not present

## 2022-09-13 DIAGNOSIS — M7542 Impingement syndrome of left shoulder: Secondary | ICD-10-CM | POA: Diagnosis not present

## 2022-09-13 DIAGNOSIS — F319 Bipolar disorder, unspecified: Secondary | ICD-10-CM | POA: Diagnosis not present

## 2022-09-13 DIAGNOSIS — R891 Abnormal level of hormones in specimens from other organs, systems and tissues: Secondary | ICD-10-CM | POA: Diagnosis not present

## 2022-09-13 DIAGNOSIS — R5382 Chronic fatigue, unspecified: Secondary | ICD-10-CM | POA: Diagnosis not present

## 2022-09-20 DIAGNOSIS — M7542 Impingement syndrome of left shoulder: Secondary | ICD-10-CM | POA: Diagnosis not present

## 2022-09-20 DIAGNOSIS — F319 Bipolar disorder, unspecified: Secondary | ICD-10-CM | POA: Diagnosis not present

## 2022-09-24 DIAGNOSIS — M7542 Impingement syndrome of left shoulder: Secondary | ICD-10-CM | POA: Diagnosis not present

## 2022-09-26 ENCOUNTER — Encounter: Payer: Self-pay | Admitting: Gastroenterology

## 2022-09-26 ENCOUNTER — Ambulatory Visit (INDEPENDENT_AMBULATORY_CARE_PROVIDER_SITE_OTHER): Payer: Medicare HMO | Admitting: Gastroenterology

## 2022-09-26 VITALS — BP 118/71 | HR 81 | Temp 98.4°F | Ht 63.0 in | Wt 109.6 lb

## 2022-09-26 DIAGNOSIS — R7989 Other specified abnormal findings of blood chemistry: Secondary | ICD-10-CM | POA: Diagnosis not present

## 2022-09-26 DIAGNOSIS — K219 Gastro-esophageal reflux disease without esophagitis: Secondary | ICD-10-CM | POA: Diagnosis not present

## 2022-09-26 NOTE — Patient Instructions (Signed)
We will see you back in 1 year!  Please call if any changes in your symptoms.  I enjoyed seeing you again today! I value our relationship and want to provide genuine, compassionate, and quality care. You may receive a survey regarding your visit with me, and I welcome your feedback! Thanks so much for taking the time to complete this. I look forward to seeing you again.      Gelene Mink, PhD, ANP-BC [facility]

## 2022-09-26 NOTE — Progress Notes (Signed)
Gastroenterology Office Note     Primary Care Physician:  Kirstie Peri, MD  Primary Gastroenterologist: Dr. Marletta Lor    Chief Complaint   Chief Complaint  Patient presents with   post procedure follow up    Follow up on EGD and TCS. Reports she has loose stools when she gets stressed. Tried benefiber but then stools got hard. Reports she has gained weight 9 lbs. States lowest weight was 100 lbs and today she is 109.6 lbs. Takes protonix at least once a day and forgets 2nd dose most of the time.      History of Present Illness   Samantha Chambers is a 52 y.o. female presenting today in follow-up with a history of fecal incontinence, dyspepsia, s/p colonoscopy and EGD in interim from last visit.    Benefiber made stool too hard. PPI BID. Sometimes forgets and will have burning in epigastric region. No nausea or vomiting. No postprandial pain. Increased oral intake. Gained weight and is excited about this. If stressed, will have an urgent mushy stool. Social stressors have decreased significantly, and she feels much better. Less episodes. Not interested in anti-spasmodic. Overall feels improved. No incontinent episodes.   Colonoscopy March 2024: non-bleeding internal hemorrhoids, one 5 mm polyp (tubular adenoma) 7 year surveillance.   EGD March 2024: small hiatal hernia, gastritis, normal duodenum. negative H.pylori. negative Barrett's.        Past Medical History:  Diagnosis Date   Angio-edema    Anxiety    Bipolar disorder (HCC)    Depression    Headache(784.0)    Irritable bowel    Urticaria     Past Surgical History:  Procedure Laterality Date   BIOPSY  07/17/2022   Procedure: BIOPSY;  Surgeon: Lanelle Bal, DO;  [facility];  Service: Endoscopy;;   COLONOSCOPY WITH PROPOFOL N/A 07/17/2022   non-bleeding internal hemorrhoids, one 5 mm polyp (tubular adenoma) 7 year surveillance.   ESOPHAGOGASTRODUODENOSCOPY (EGD) WITH PROPOFOL N/A 07/17/2022    small hiatal hernia, gastritis, normal duodenum. negative H.pylori. negative Barrett's.   FOOT SURGERY      Current Outpatient Medications  Medication Sig Dispense Refill   ALPRAZolam (XANAX) 1 MG tablet Take 1 tablet (1 mg total) by mouth 4 (four) times daily as needed. 120 tablet 2   desvenlafaxine (PRISTIQ) 50 MG 24 hr tablet Take 1 tablet (50 mg total) by mouth daily. 30 tablet 3   desvenlafaxine (PRISTIQ) 50 MG 24 hr tablet Take 1 tablet (50 mg total) by mouth daily. 30 tablet 2   diazepam (VALIUM) 5 MG tablet Take 1 tablet (5 mg total) by mouth 4 (four) times daily as needed for anxiety. 60 tablet 0   metoprolol succinate (TOPROL-XL) 25 MG 24 hr tablet Take 12.5 mg by mouth daily.     OLANZapine (ZYPREXA) 5 MG tablet Take 1 tablet (5 mg total) by mouth at bedtime. 30 tablet 2   pantoprazole (PROTONIX) 40 MG tablet Take 1 tablet (40 mg total) by mouth 2 (two) times daily before a meal. 60 tablet 11   progesterone (PROMETRIUM) 200 MG capsule Take 200 mg by mouth at bedtime.     rosuvastatin (CRESTOR) 5 MG tablet Take 5 mg by mouth every evening.     valACYclovir (VALTREX) 500 MG tablet Take 500 mg by mouth daily.     No current facility-administered medications for this visit.    Allergies as of 09/26/2022 - Review Complete 09/26/2022  Allergen Reaction Noted  Voltaren [diclofenac] Anaphylaxis, Hives, and Swelling 07/13/2022   Clonazepam Nausea And Vomiting 02/13/2013   Sulfa antibiotics Hives 02/13/2013   Viibryd [vilazodone hcl]  07/13/2022    Family History  Problem Relation Age of Onset   Bipolar disorder Mother    Depression Maternal Aunt    Depression Maternal Uncle    Urticaria Neg Hx    Immunodeficiency Neg Hx    Eczema Neg Hx    Atopy Neg Hx    Asthma Neg Hx    Angioedema Neg Hx    Allergic rhinitis Neg Hx     Social History   Socioeconomic History   Marital status: Divorced    Spouse name: Not on file   Number of children: Not on file   Years of  education: Not on file   Highest education level: Not on file  Occupational History   Not on file  Tobacco Use   Smoking status: Never    Passive exposure: Past   Smokeless tobacco: Never  Vaping Use   Vaping Use: Never used  Substance and Sexual Activity   Alcohol use: Yes    Comment: Rare glass of wine   Drug use: Yes    Types: Marijuana    Comment: smokes cannabis for anxiety    Sexual activity: Not Currently    Partners: Male    Birth control/protection: Pill  Other Topics Concern   Not on file  Social History Narrative   Not on file   Social Determinants of Health   Financial Resource Strain: Not on file  Food Insecurity: Not on file  Transportation Needs: Not on file  Physical Activity: Not on file  Stress: Not on file  Social Connections: Not on file  Intimate Partner Violence: Not on file     Review of Systems   Gen: Denies any fever, chills, fatigue, weight loss, lack of appetite.  CV: Denies chest pain, heart palpitations, peripheral edema, syncope.  Resp: Denies shortness of breath at rest or with exertion. Denies wheezing or cough.  GI: Denies dysphagia or odynophagia. Denies jaundice, hematemesis, fecal incontinence. GU : Denies urinary burning, urinary frequency, urinary hesitancy MS: Denies joint pain, muscle weakness, cramps, or limitation of movement.  Derm: Denies rash, itching, dry skin Psych: Denies depression, anxiety, memory loss, and confusion Heme: Denies bruising, bleeding, and enlarged lymph nodes.   Physical Exam   BP 118/71 (BP Location: Left Arm, Patient Position: Sitting, Cuff Size: Normal)   Pulse 81   Temp 98.4 F (36.9 C) (Oral)   Ht 5\' 3"  (1.6 m)   Wt 109 lb 9.6 oz (49.7 kg)   BMI 19.41 kg/m  General:   Alert and oriented. Pleasant and cooperative. Well-nourished and well-developed.  Head:  Normocephalic and atraumatic. Eyes:  Without icterus Abdomen:  +BS, soft, non-tender and non-distended. No HSM noted. No guarding or  rebound. No masses appreciated.  Rectal:  Deferred  Msk:  Symmetrical without gross deformities. Normal posture. Extremities:  Without edema. Neurologic:  Alert and  oriented x4;  grossly normal neurologically. Skin:  Intact without significant lesions or rashes. Psych:  Alert and cooperative. Normal mood and affect.   Assessment   Samantha Chambers is a 52 y.o. female presenting today in follow-up with a history of fecal incontinence, dyspepsia, s/p colonoscopy and EGD in interim from last visit.   Dyspepsia: EGD with gastritis. Notes improvement on PPI BID. Notes worsening symptoms if just once daily and would like to continue BID. No alarm signs/symptoms.  Appetite improved and has had weight gain appropriately.  Fecal incontinence/urgency: noted under stress. No further episodes since last seen. Stress significantly reduced. Colonoscopy with adenoma and surveillance due in 7 years. No alarm signs/symptoms.      PLAN    Continue PPI BID Return in 1 year Call if worsening or recurrent symptoms.    Gelene Mink, PhD, ANP-BC [facility]

## 2022-09-27 ENCOUNTER — Encounter (HOSPITAL_COMMUNITY): Payer: Self-pay | Admitting: Psychiatry

## 2022-09-27 ENCOUNTER — Telehealth (INDEPENDENT_AMBULATORY_CARE_PROVIDER_SITE_OTHER): Payer: Medicare HMO | Admitting: Psychiatry

## 2022-09-27 DIAGNOSIS — F411 Generalized anxiety disorder: Secondary | ICD-10-CM

## 2022-09-27 DIAGNOSIS — F900 Attention-deficit hyperactivity disorder, predominantly inattentive type: Secondary | ICD-10-CM

## 2022-09-27 DIAGNOSIS — F3132 Bipolar disorder, current episode depressed, moderate: Secondary | ICD-10-CM | POA: Diagnosis not present

## 2022-09-27 DIAGNOSIS — M7542 Impingement syndrome of left shoulder: Secondary | ICD-10-CM | POA: Diagnosis not present

## 2022-09-27 MED ORDER — ALPRAZOLAM 1 MG PO TABS: 1.0000 mg | ORAL_TABLET | Freq: Four times a day (QID) | ORAL | 2 refills | Status: DC | PRN

## 2022-09-27 MED ORDER — DESVENLAFAXINE SUCCINATE ER 50 MG PO TB24: 50.0000 mg | ORAL_TABLET | Freq: Every day | ORAL | 3 refills | Status: DC

## 2022-09-27 MED ORDER — OLANZAPINE 5 MG PO TABS: 5.0000 mg | ORAL_TABLET | Freq: Every day | ORAL | 2 refills | Status: DC

## 2022-09-27 MED ORDER — MIRTAZAPINE 15 MG PO TABS: 7.5000 mg | ORAL_TABLET | Freq: Every day | ORAL | 2 refills | Status: DC

## 2022-09-27 NOTE — Progress Notes (Signed)
Virtual Visit via Video Note  I connected with Samantha Chambers on 09/27/22 at  9:40 AM EDT by a video enabled telemedicine application and verified that I am speaking with the correct person using two identifiers.  Location: Patient: home Provider: office   I discussed the limitations of evaluation and management by telemedicine and the availability of in person appointments. The patient expressed understanding and agreed to proceed.     I discussed the assessment and treatment plan with the patient. The patient was provided an opportunity to ask questions and all were answered. The patient agreed with the plan and demonstrated an understanding of the instructions.   The patient was advised to call back or seek an in-person evaluation if the symptoms worsen or if the condition fails to improve as anticipated.  I provided 15 minutes of non-face-to-face time during this encounter.   Diannia Ruder, MD  [facility] MD/PA/NP OP Progress Note  09/27/2022 10:01 AM Samantha Chambers  MRN:  [id number]  Chief Complaint:  Chief Complaint  Patient presents with   Depression   Anxiety   Manic Behavior   Follow-up   HPI: This patient is a 52 year old divorced white female who lives with her teenage daughter in [place].  She is on disability.  The patient returns for follow-up after 2 months regarding her depression anxiety and possible bipolar disorder.  She states for the most part she has been doing okay.  She recently found out that the man she was seeing is also seeing someone else.  She is going to let the other woman know which I do not think is very well advised but she wants to do it to "create closure."  She states that she always feels like she has to have a man in her life and I urged her to take a break and take care of herself.  To her credit she is eating more and has maintained 109 pounds.  She is going to yoga and to the gym and is trying to eat healthier food.  Right now her teenage  daughter is staying with her ex husband so she has less stress in her life.  She is sleeping well and does not have any manic symptoms such as racing thoughts.  Xanax continues to help her anxiety Visit Diagnosis:    ICD-10-CM   1. Bipolar 1 disorder, depressed, moderate (HCC)  F31.32     2. Generalized anxiety disorder  F41.1     3. Attention deficit hyperactivity disorder (ADHD), predominantly inattentive type  F90.0       Past Psychiatric History: Long-term outpatient treatment  Past Medical History:  Past Medical History:  Diagnosis Date   Angio-edema    Anxiety    Bipolar disorder (HCC)    Depression    Headache(784.0)    Irritable bowel    Urticaria     Past Surgical History:  Procedure Laterality Date   BIOPSY  07/17/2022   Procedure: BIOPSY;  Surgeon: Lanelle Bal, DO;  [facility];  Service: Endoscopy;;   COLONOSCOPY WITH PROPOFOL N/A 07/17/2022   non-bleeding internal hemorrhoids, one 5 mm polyp (tubular adenoma) 7 year surveillance.   ESOPHAGOGASTRODUODENOSCOPY (EGD) WITH PROPOFOL N/A 07/17/2022   small hiatal hernia, gastritis, normal duodenum. negative H.pylori. negative Barrett's.   FOOT SURGERY      Family Psychiatric History: See below  Family History:  Family History  Problem Relation Age of Onset   Bipolar disorder Mother    Depression  Maternal Aunt    Depression Maternal Uncle    Urticaria Neg Hx    Immunodeficiency Neg Hx    Eczema Neg Hx    Atopy Neg Hx    Asthma Neg Hx    Angioedema Neg Hx    Allergic rhinitis Neg Hx     Social History:  Social History   Socioeconomic History   Marital status: Divorced    Spouse name: Not on file   Number of children: Not on file   Years of education: Not on file   Highest education level: Not on file  Occupational History   Not on file  Tobacco Use   Smoking status: Never    Passive exposure: Past   Smokeless tobacco: Never  Vaping Use   Vaping Use: Never used  Substance and  Sexual Activity   Alcohol use: Yes    Comment: Rare glass of wine   Drug use: Yes    Types: Marijuana    Comment: smokes cannabis for anxiety    Sexual activity: Not Currently    Partners: Male    Birth control/protection: Pill  Other Topics Concern   Not on file  Social History Narrative   Not on file   Social Determinants of Health   Financial Resource Strain: Not on file  Food Insecurity: Not on file  Transportation Needs: Not on file  Physical Activity: Not on file  Stress: Not on file  Social Connections: Not on file    Allergies:  Allergies  Allergen Reactions   Voltaren [Diclofenac] Anaphylaxis, Hives and Swelling   Clonazepam Nausea And Vomiting    Migraine   Sulfa Antibiotics Hives   Viibryd [Vilazodone Hcl]     Stomach issues     Metabolic Disorder Labs: No results found for: "HGBA1C", "MPG" No results found for: "PROLACTIN" No results found for: "CHOL", "TRIG", "HDL", "CHOLHDL", "VLDL", "LDLCALC" No results found for: "TSH"  Therapeutic Level Labs: No results found for: "LITHIUM" Lab Results  Component Value Date   VALPROATE 33.8 (L) 03/13/2013   No results found for: "CBMZ"  Current Medications: Current Outpatient Medications  Medication Sig Dispense Refill   ALPRAZolam (XANAX) 1 MG tablet Take 1 tablet (1 mg total) by mouth 4 (four) times daily as needed. 120 tablet 2   desvenlafaxine (PRISTIQ) 50 MG 24 hr tablet Take 1 tablet (50 mg total) by mouth daily. 30 tablet 3   diazepam (VALIUM) 5 MG tablet Take 1 tablet (5 mg total) by mouth 4 (four) times daily as needed for anxiety. 60 tablet 0   metoprolol succinate (TOPROL-XL) 25 MG 24 hr tablet Take 12.5 mg by mouth daily.     mirtazapine (REMERON) 15 MG tablet Take 0.5 tablets (7.5 mg total) by mouth at bedtime. 30 tablet 2   OLANZapine (ZYPREXA) 5 MG tablet Take 1 tablet (5 mg total) by mouth at bedtime. 30 tablet 2   pantoprazole (PROTONIX) 40 MG tablet Take 1 tablet (40 mg total) by mouth 2  (two) times daily before a meal. 60 tablet 11   progesterone (PROMETRIUM) 200 MG capsule Take 200 mg by mouth at bedtime.     rosuvastatin (CRESTOR) 5 MG tablet Take 5 mg by mouth every evening.     valACYclovir (VALTREX) 500 MG tablet Take 500 mg by mouth daily.     No current facility-administered medications for this visit.     Musculoskeletal: Strength & Muscle Tone: within normal limits Gait & Station: normal Patient leans: N/A  Psychiatric  Specialty Exam: Review of Systems  Psychiatric/Behavioral:  The patient is nervous/anxious.   All other systems reviewed and are negative.   There were no vitals taken for this visit.There is no height or weight on file to calculate BMI.  General Appearance: Casual and Fairly Groomed  Eye Contact:  Good  Speech:  Clear and Coherent  Volume:  Normal  Mood:  Anxious and Euthymic  Affect:  Congruent  Thought Process:  Goal Directed  Orientation:  Full (Time, Place, and Person)  Thought Content: Rumination   Suicidal Thoughts:  No  Homicidal Thoughts:  No  Memory:  Immediate;   Good Recent;   Good Remote;   Good  Judgement:  Fair  Insight:  Fair  Psychomotor Activity:  Normal  Concentration:  Concentration: Good and Attention Span: Good  Recall:  Good  Fund of Knowledge: Good  Language: Good  Akathisia:  No  Handed:  Right  AIMS (if indicated): not done  Assets:  Communication Skills Desire for Improvement Physical Health Resilience Social Support Talents/Skills  ADL's:  Intact  Cognition: WNL  Sleep:  Good   Screenings: MDI    Flowsheet Row Office Visit from 11/22/2015 in [facility] [place]  Total Score (max 50) 38      PHQ2-9    Flowsheet Row Counselor from 02/01/2022 in [facility] Most recent reading at 02/05/2022  2:58 PM Video Visit from 02/05/2022 in [facility] [place] Most recent reading at 02/05/2022  1:31 PM Video  Visit from 01/04/2022 in [facility] [place] Most recent reading at 01/04/2022  2:35 PM Video Visit from 11/06/2021 in [facility] at [place] Most recent reading at 11/06/2021 10:40 AM Video Visit from 09/12/2021 in [facility] at [place] Most recent reading at 09/12/2021  4:08 PM  PHQ-2 Total Score 6 4 5 1 4   PHQ-9 Total Score 25 15 10  -- 11      Flowsheet Row Admission (Discharged) from 07/17/2022 in [facility] Most recent reading at 07/17/2022  9:41 AM Counselor from 02/01/2022 in BEHAVIORAL HEALTH INTENSIVE PSYCH Most recent reading at 02/05/2022  2:58 PM Video Visit from 02/05/2022 in [facility] [place] Most recent reading at 02/05/2022  1:31 PM  C-SSRS RISK CATEGORY No Risk Error: Question 6 not populated Error: Q3, 4, or 5 should not be populated when Q2 is No        Assessment and Plan: This patient is a 52 year old female with a history of bipolar disorder and anxiety.  For the most part she is doing okay but still has a lot of drama involved with people she is dating.  She does not seem to make very healthy choices and always gets involved with people who are not totally accessible.  For now she will continue Pristiq 50 mg daily for depression and anxiety, olanzapine 5 mg at bedtime for mood stabilization and Xanax 1 mg 4 times daily as needed for anxiety.  She states that she has gone back to mirtazapine 7.5 mg to help with appetite and anxiety.  She will return to see me in 2 months  Collaboration of Care: Collaboration of Care: Primary Care Provider AEB notes will be shared with PCP at patient's request  Patient/Guardian was advised Release of Information must be obtained prior to any record release in order to collaborate their care with an outside provider. Patient/Guardian was  advised if they have not already done so to contact the registration  department to sign all necessary forms in order for [place] to release information regarding their care.   Consent: Patient/Guardian gives verbal consent for treatment and assignment of benefits for services provided during this visit. Patient/Guardian expressed understanding and agreed to proceed.    Diannia Ruder, MD 09/27/2022, 10:01 AM

## 2022-09-28 ENCOUNTER — Telehealth (HOSPITAL_COMMUNITY): Payer: Medicare HMO | Admitting: Psychiatry

## 2022-10-02 DIAGNOSIS — M7542 Impingement syndrome of left shoulder: Secondary | ICD-10-CM | POA: Diagnosis not present

## 2022-10-04 DIAGNOSIS — F3161 Bipolar disorder, current episode mixed, mild: Secondary | ICD-10-CM | POA: Diagnosis not present

## 2022-10-08 DIAGNOSIS — M7542 Impingement syndrome of left shoulder: Secondary | ICD-10-CM | POA: Diagnosis not present

## 2022-10-11 DIAGNOSIS — F3161 Bipolar disorder, current episode mixed, mild: Secondary | ICD-10-CM | POA: Diagnosis not present

## 2022-10-11 DIAGNOSIS — M7542 Impingement syndrome of left shoulder: Secondary | ICD-10-CM | POA: Diagnosis not present

## 2022-10-18 DIAGNOSIS — M7542 Impingement syndrome of left shoulder: Secondary | ICD-10-CM | POA: Diagnosis not present

## 2022-10-22 DIAGNOSIS — M7542 Impingement syndrome of left shoulder: Secondary | ICD-10-CM | POA: Diagnosis not present

## 2022-10-24 DIAGNOSIS — M7542 Impingement syndrome of left shoulder: Secondary | ICD-10-CM | POA: Diagnosis not present

## 2022-11-04 DIAGNOSIS — I10 Essential (primary) hypertension: Secondary | ICD-10-CM | POA: Diagnosis not present

## 2022-11-05 DIAGNOSIS — M7542 Impingement syndrome of left shoulder: Secondary | ICD-10-CM | POA: Diagnosis not present

## 2022-11-07 DIAGNOSIS — M7542 Impingement syndrome of left shoulder: Secondary | ICD-10-CM | POA: Diagnosis not present

## 2022-11-12 DIAGNOSIS — M7542 Impingement syndrome of left shoulder: Secondary | ICD-10-CM | POA: Diagnosis not present

## 2022-11-14 DIAGNOSIS — M7542 Impingement syndrome of left shoulder: Secondary | ICD-10-CM | POA: Diagnosis not present

## 2022-11-16 DIAGNOSIS — R5383 Other fatigue: Secondary | ICD-10-CM | POA: Diagnosis not present

## 2022-11-16 DIAGNOSIS — Z1339 Encounter for screening examination for other mental health and behavioral disorders: Secondary | ICD-10-CM | POA: Diagnosis not present

## 2022-11-16 DIAGNOSIS — Z1331 Encounter for screening for depression: Secondary | ICD-10-CM | POA: Diagnosis not present

## 2022-11-16 DIAGNOSIS — E559 Vitamin D deficiency, unspecified: Secondary | ICD-10-CM | POA: Diagnosis not present

## 2022-11-16 DIAGNOSIS — Z79899 Other long term (current) drug therapy: Secondary | ICD-10-CM | POA: Diagnosis not present

## 2022-11-16 DIAGNOSIS — Z0000 Encounter for general adult medical examination without abnormal findings: Secondary | ICD-10-CM | POA: Diagnosis not present

## 2022-11-16 DIAGNOSIS — Z299 Encounter for prophylactic measures, unspecified: Secondary | ICD-10-CM | POA: Diagnosis not present

## 2022-11-16 DIAGNOSIS — E2839 Other primary ovarian failure: Secondary | ICD-10-CM | POA: Diagnosis not present

## 2022-11-16 DIAGNOSIS — E538 Deficiency of other specified B group vitamins: Secondary | ICD-10-CM | POA: Diagnosis not present

## 2022-11-16 DIAGNOSIS — F419 Anxiety disorder, unspecified: Secondary | ICD-10-CM | POA: Diagnosis not present

## 2022-11-16 DIAGNOSIS — F319 Bipolar disorder, unspecified: Secondary | ICD-10-CM | POA: Diagnosis not present

## 2022-11-16 DIAGNOSIS — E7800 Pure hypercholesterolemia, unspecified: Secondary | ICD-10-CM | POA: Diagnosis not present

## 2022-11-16 DIAGNOSIS — I471 Supraventricular tachycardia, unspecified: Secondary | ICD-10-CM | POA: Diagnosis not present

## 2022-11-16 DIAGNOSIS — Z7189 Other specified counseling: Secondary | ICD-10-CM | POA: Diagnosis not present

## 2022-11-19 DIAGNOSIS — M7542 Impingement syndrome of left shoulder: Secondary | ICD-10-CM | POA: Diagnosis not present

## 2022-11-26 DIAGNOSIS — M7542 Impingement syndrome of left shoulder: Secondary | ICD-10-CM | POA: Diagnosis not present

## 2022-11-27 ENCOUNTER — Encounter (HOSPITAL_COMMUNITY): Payer: Self-pay | Admitting: Psychiatry

## 2022-11-27 ENCOUNTER — Telehealth (INDEPENDENT_AMBULATORY_CARE_PROVIDER_SITE_OTHER): Payer: Medicare HMO | Admitting: Psychiatry

## 2022-11-27 DIAGNOSIS — F411 Generalized anxiety disorder: Secondary | ICD-10-CM

## 2022-11-27 DIAGNOSIS — F3132 Bipolar disorder, current episode depressed, moderate: Secondary | ICD-10-CM

## 2022-11-27 MED ORDER — DESVENLAFAXINE SUCCINATE ER 50 MG PO TB24: 50.0000 mg | ORAL_TABLET | Freq: Every day | ORAL | 3 refills | Status: DC

## 2022-11-27 MED ORDER — ALPRAZOLAM 1 MG PO TABS: 1.0000 mg | ORAL_TABLET | Freq: Four times a day (QID) | ORAL | 2 refills | Status: DC | PRN

## 2022-11-27 MED ORDER — MIRTAZAPINE 15 MG PO TABS: 7.5000 mg | ORAL_TABLET | Freq: Every day | ORAL | 2 refills | Status: DC

## 2022-11-27 MED ORDER — OLANZAPINE 5 MG PO TABS: 5.0000 mg | ORAL_TABLET | Freq: Every day | ORAL | 2 refills | Status: DC

## 2022-11-27 NOTE — Progress Notes (Signed)
Virtual Visit via Video Note  I connected with Samantha Chambers on 11/27/22 at  9:00 AM EDT by a video enabled telemedicine application and verified that I am speaking with the correct person using two identifiers.  Location: Patient: home Provider: office   I discussed the limitations of evaluation and management by telemedicine and the availability of in person appointments. The patient expressed understanding and agreed to proceed.     I discussed the assessment and treatment plan with the patient. The patient was provided an opportunity to ask questions and all were answered. The patient agreed with the plan and demonstrated an understanding of the instructions.   The patient was advised to call back or seek an in-person evaluation if the symptoms worsen or if the condition fails to improve as anticipated.  I provided 15 minutes of non-face-to-face time during this encounter.   Diannia Ruder, MD  [facility] MD/PA/NP OP Progress Note  11/27/2022 9:21 AM Samantha Chambers  MRN:  [id number]  Chief Complaint:  Chief Complaint  Patient presents with   Depression   Anxiety   Follow-up   HPI: This patient is a 52 year old divorced white female who lives with her teenage daughter in [place].  She is on disability.  The patient returns for follow-up after 2 months regarding her depression anxiety and possible bipolar disorder.  She states that for the most part she is doing fairly well.  Her teenage daughter has been more settled this summer and last erratic.  She does have shoulder pain has been getting to physical therapy.  She has been going to yoga in the gym.  She has gained a little bit of weight but then lost it again when she stopped the mirtazapine.  I urged her to stick with that.  She denies significant depression thoughts of self-harm or suicide.  The Xanax continues to help with her anxiety Visit Diagnosis:    ICD-10-CM   1. Bipolar 1 disorder, depressed, moderate (HCC)  F31.32      2. Generalized anxiety disorder  F41.1       Past Psychiatric History: Long-term outpatient treatment  Past Medical History:  Past Medical History:  Diagnosis Date   Angio-edema    Anxiety    Bipolar disorder (HCC)    Depression    Headache(784.0)    Irritable bowel    Urticaria     Past Surgical History:  Procedure Laterality Date   BIOPSY  07/17/2022   Procedure: BIOPSY;  Surgeon: Lanelle Bal, DO;  [facility];  Service: Endoscopy;;   COLONOSCOPY WITH PROPOFOL N/A 07/17/2022   non-bleeding internal hemorrhoids, one 5 mm polyp (tubular adenoma) 7 year surveillance.   ESOPHAGOGASTRODUODENOSCOPY (EGD) WITH PROPOFOL N/A 07/17/2022   small hiatal hernia, gastritis, normal duodenum. negative H.pylori. negative Barrett's.   FOOT SURGERY      Family Psychiatric History: See below  Family History:  Family History  Problem Relation Age of Onset   Bipolar disorder Mother    Depression Maternal Aunt    Depression Maternal Uncle    Urticaria Neg Hx    Immunodeficiency Neg Hx    Eczema Neg Hx    Atopy Neg Hx    Asthma Neg Hx    Angioedema Neg Hx    Allergic rhinitis Neg Hx     Social History:  Social History   Socioeconomic History   Marital status: Divorced    Spouse name: Not on file   Number of children: Not on  file   Years of education: Not on file   Highest education level: Not on file  Occupational History   Not on file  Tobacco Use   Smoking status: Never    Passive exposure: Past   Smokeless tobacco: Never  Vaping Use   Vaping status: Never Used  Substance and Sexual Activity   Alcohol use: Yes    Comment: Rare glass of wine   Drug use: Yes    Types: Marijuana    Comment: smokes cannabis for anxiety    Sexual activity: Not Currently    Partners: Male    Birth control/protection: Pill  Other Topics Concern   Not on file  Social History Narrative   Not on file   Social Determinants of Health   Financial Resource Strain: Not  on file  Food Insecurity: Not on file  Transportation Needs: Not on file  Physical Activity: Not on file  Stress: Not on file  Social Connections: Not on file    Allergies:  Allergies  Allergen Reactions   Voltaren [Diclofenac] Anaphylaxis, Hives and Swelling   Clonazepam Nausea And Vomiting    Migraine   Sulfa Antibiotics Hives   Viibryd [Vilazodone Hcl]     Stomach issues     Metabolic Disorder Labs: No results found for: "HGBA1C", "MPG" No results found for: "PROLACTIN" No results found for: "CHOL", "TRIG", "HDL", "CHOLHDL", "VLDL", "LDLCALC" No results found for: "TSH"  Therapeutic Level Labs: No results found for: "LITHIUM" Lab Results  Component Value Date   VALPROATE 33.8 (L) 03/13/2013   No results found for: "CBMZ"  Current Medications: Current Outpatient Medications  Medication Sig Dispense Refill   ALPRAZolam (XANAX) 1 MG tablet Take 1 tablet (1 mg total) by mouth 4 (four) times daily as needed. 120 tablet 2   desvenlafaxine (PRISTIQ) 50 MG 24 hr tablet Take 1 tablet (50 mg total) by mouth daily. 30 tablet 3   diazepam (VALIUM) 5 MG tablet Take 1 tablet (5 mg total) by mouth 4 (four) times daily as needed for anxiety. 60 tablet 0   metoprolol succinate (TOPROL-XL) 25 MG 24 hr tablet Take 12.5 mg by mouth daily.     mirtazapine (REMERON) 15 MG tablet Take 0.5 tablets (7.5 mg total) by mouth at bedtime. 30 tablet 2   OLANZapine (ZYPREXA) 5 MG tablet Take 1 tablet (5 mg total) by mouth at bedtime. 30 tablet 2   pantoprazole (PROTONIX) 40 MG tablet Take 1 tablet (40 mg total) by mouth 2 (two) times daily before a meal. 60 tablet 11   progesterone (PROMETRIUM) 200 MG capsule Take 200 mg by mouth at bedtime.     rosuvastatin (CRESTOR) 5 MG tablet Take 5 mg by mouth every evening.     valACYclovir (VALTREX) 500 MG tablet Take 500 mg by mouth daily.     No current facility-administered medications for this visit.     Musculoskeletal: Strength & Muscle Tone:  within normal limits Gait & Station: normal Patient leans: N/A  Psychiatric Specialty Exam: Review of Systems  Musculoskeletal:  Positive for arthralgias.  All other systems reviewed and are negative.   There were no vitals taken for this visit.There is no height or weight on file to calculate BMI.  General Appearance: Casual and Fairly Groomed  Eye Contact:  Good  Speech:  Clear and Coherent  Volume:  Normal  Mood:  Euthymic  Affect:  Congruent  Thought Process:  Goal Directed  Orientation:  Full (Time, Place, and Person)  Thought Content: WDL   Suicidal Thoughts:  No  Homicidal Thoughts:  No  Memory:  Immediate;   Good Recent;   Good Remote;   Good  Judgement:  Good  Insight:  Good  Psychomotor Activity:  Normal  Concentration:  Concentration: Fair and Attention Span: Fair  Recall:  Good  Fund of Knowledge: Good  Language: Good  Akathisia:  No  Handed:  Right  AIMS (if indicated): not done  Assets:  Communication Skills Desire for Improvement Physical Health Resilience Social Support Talents/Skills  ADL's:  Intact  Cognition: WNL  Sleep:  Good   Screenings: MDI    Flowsheet Row Office Visit from 11/22/2015 in [facility] at [place]  Total Score (max 50) 38      PHQ2-9    Flowsheet Row Counselor from 02/01/2022 in BEHAVIORAL HEALTH INTENSIVE PSYCH Most recent reading at 02/05/2022  2:58 PM Video Visit from 02/05/2022 in [facility] at [place] Most recent reading at 02/05/2022  1:31 PM Video Visit from 01/04/2022 in [facility] at [place] Most recent reading at 01/04/2022  2:35 PM Video Visit from 11/06/2021 in [facility] at [place] Most recent reading at 11/06/2021 10:40 AM Video Visit from 09/12/2021 in [facility] at [place] Most recent reading at 09/12/2021  4:08 PM  PHQ-2 Total Score 6 4 5 1 4   PHQ-9 Total  Score 25 15 10  -- 11      Flowsheet Row Admission (Discharged) from 07/17/2022 in [facility] Most recent reading at 07/17/2022  9:41 AM Counselor from 02/01/2022 in BEHAVIORAL HEALTH INTENSIVE PSYCH Most recent reading at 02/05/2022  2:58 PM Video Visit from 02/05/2022 in [facility] at [place] Most recent reading at 02/05/2022  1:31 PM  C-SSRS RISK CATEGORY No Risk Error: Question 6 not populated Error: Q3, 4, or 5 should not be populated when Q2 is No        Assessment and Plan:  This patient is a 52 year old female with a history of bipolar disorder and anxiety.  For the most part she is continues to do well.  She will continue Pristiq 50 mg daily for depression and anxiety, olanzapine 5 mg daily at bedtime for mood stabilization, mirtazapine 7.5 mg at bedtime to help with appetite and anxiety and Xanax 1 mg 4 times daily as needed for anxiety.  She will return to see me in 2 months Collaboration of Care: Collaboration of Care: Primary Care Provider AEB notes will be shared with PCP at patient's request  Patient/Guardian was advised Release of Information must be obtained prior to any record release in order to collaborate their care with an outside provider. Patient/Guardian was advised if they have not already done so to contact the registration department to sign all necessary forms in order for [place] to release information regarding their care.   Consent: Patient/Guardian gives verbal consent for treatment and assignment of benefits for services provided during this visit. Patient/Guardian expressed understanding and agreed to proceed.    Diannia Ruder, MD 11/27/2022, 9:21 AM

## 2022-11-30 DIAGNOSIS — M7542 Impingement syndrome of left shoulder: Secondary | ICD-10-CM | POA: Diagnosis not present

## 2022-12-03 DIAGNOSIS — M7542 Impingement syndrome of left shoulder: Secondary | ICD-10-CM | POA: Diagnosis not present

## 2022-12-05 DIAGNOSIS — I10 Essential (primary) hypertension: Secondary | ICD-10-CM | POA: Diagnosis not present

## 2022-12-07 DIAGNOSIS — E2839 Other primary ovarian failure: Secondary | ICD-10-CM | POA: Diagnosis not present

## 2022-12-17 DIAGNOSIS — M7542 Impingement syndrome of left shoulder: Secondary | ICD-10-CM | POA: Diagnosis not present

## 2022-12-19 ENCOUNTER — Other Ambulatory Visit (HOSPITAL_COMMUNITY): Payer: Self-pay | Admitting: Psychiatry

## 2022-12-20 DIAGNOSIS — M7542 Impingement syndrome of left shoulder: Secondary | ICD-10-CM | POA: Diagnosis not present

## 2022-12-23 ENCOUNTER — Other Ambulatory Visit (HOSPITAL_COMMUNITY): Payer: Self-pay | Admitting: Psychiatry

## 2022-12-27 DIAGNOSIS — M7542 Impingement syndrome of left shoulder: Secondary | ICD-10-CM | POA: Diagnosis not present

## 2023-01-05 DIAGNOSIS — I10 Essential (primary) hypertension: Secondary | ICD-10-CM | POA: Diagnosis not present

## 2023-01-09 DIAGNOSIS — M7542 Impingement syndrome of left shoulder: Secondary | ICD-10-CM | POA: Diagnosis not present

## 2023-01-15 DIAGNOSIS — M7542 Impingement syndrome of left shoulder: Secondary | ICD-10-CM | POA: Diagnosis not present

## 2023-01-17 DIAGNOSIS — M7542 Impingement syndrome of left shoulder: Secondary | ICD-10-CM | POA: Diagnosis not present

## 2023-01-18 DIAGNOSIS — Z299 Encounter for prophylactic measures, unspecified: Secondary | ICD-10-CM | POA: Diagnosis not present

## 2023-01-18 DIAGNOSIS — Z0000 Encounter for general adult medical examination without abnormal findings: Secondary | ICD-10-CM | POA: Diagnosis not present

## 2023-01-18 DIAGNOSIS — M1710 Unilateral primary osteoarthritis, unspecified knee: Secondary | ICD-10-CM | POA: Diagnosis not present

## 2023-01-18 DIAGNOSIS — I471 Supraventricular tachycardia, unspecified: Secondary | ICD-10-CM | POA: Diagnosis not present

## 2023-01-18 DIAGNOSIS — F319 Bipolar disorder, unspecified: Secondary | ICD-10-CM | POA: Diagnosis not present

## 2023-01-22 DIAGNOSIS — M7542 Impingement syndrome of left shoulder: Secondary | ICD-10-CM | POA: Diagnosis not present

## 2023-01-23 DIAGNOSIS — M7542 Impingement syndrome of left shoulder: Secondary | ICD-10-CM | POA: Diagnosis not present

## 2023-01-23 DIAGNOSIS — R891 Abnormal level of hormones in specimens from other organs, systems and tissues: Secondary | ICD-10-CM | POA: Diagnosis not present

## 2023-01-23 DIAGNOSIS — R5382 Chronic fatigue, unspecified: Secondary | ICD-10-CM | POA: Diagnosis not present

## 2023-01-23 DIAGNOSIS — L658 Other specified nonscarring hair loss: Secondary | ICD-10-CM | POA: Diagnosis not present

## 2023-01-28 ENCOUNTER — Telehealth (HOSPITAL_COMMUNITY): Payer: Medicare HMO | Admitting: Psychiatry

## 2023-01-29 ENCOUNTER — Encounter (HOSPITAL_COMMUNITY): Payer: Self-pay | Admitting: Psychiatry

## 2023-01-29 ENCOUNTER — Telehealth (INDEPENDENT_AMBULATORY_CARE_PROVIDER_SITE_OTHER): Payer: Medicare HMO | Admitting: Psychiatry

## 2023-01-29 DIAGNOSIS — F3132 Bipolar disorder, current episode depressed, moderate: Secondary | ICD-10-CM

## 2023-01-29 DIAGNOSIS — F411 Generalized anxiety disorder: Secondary | ICD-10-CM | POA: Diagnosis not present

## 2023-01-29 MED ORDER — DESVENLAFAXINE SUCCINATE ER 50 MG PO TB24: 50.0000 mg | ORAL_TABLET | Freq: Every day | ORAL | 3 refills | Status: DC

## 2023-01-29 MED ORDER — OLANZAPINE 5 MG PO TABS: 5.0000 mg | ORAL_TABLET | Freq: Every day | ORAL | 0 refills | Status: DC

## 2023-01-29 MED ORDER — ALPRAZOLAM 1 MG PO TABS: 1.0000 mg | ORAL_TABLET | Freq: Four times a day (QID) | ORAL | 2 refills | Status: DC | PRN

## 2023-01-29 NOTE — Progress Notes (Signed)
Virtual Visit via Video Note  I connected with Samantha Chambers on 01/29/23 at 10:00 AM EDT by a video enabled telemedicine application and verified that I am speaking with the correct person using two identifiers.  Location: Patient: home Provider: office   I discussed the limitations of evaluation and management by telemedicine and the availability of in person appointments. The patient expressed understanding and agreed to proceed.     I discussed the assessment and treatment plan with the patient. The patient was provided an opportunity to ask questions and all were answered. The patient agreed with the plan and demonstrated an understanding of the instructions.   The patient was advised to call back or seek an in-person evaluation if the symptoms worsen or if the condition fails to improve as anticipated.  I provided 15 minutes of non-face-to-face time during this encounter.   Diannia Ruder, MD  [facility] MD/PA/NP OP Progress Note  01/29/2023 10:31 AM Samantha Chambers  MRN:  [id number]  Chief Complaint:  Chief Complaint  Patient presents with   Depression   Anxiety   Follow-up   HPI: This patient is a 52 year old divorced white female who lives with her teenage daughter in [place].  She is on disability.  The patient returns for follow-up after 2 months regarding her depression and anxiety and possible bipolar disorder.  Currently her teenage daughter is in the pediatric [facility] after drinking too much on top of Wellbutrin and developing seizures and erratic behavior.  Despite this the patient states that she has been doing well.  She considers her mood to be stable.  She is taking her medications although she stopped the mirtazapine because she felt she was gaining too much weight.  She is continuing to go to yoga and is also started a book club.  The Xanax continues to help with her anxiety.  She states that she and the girl's father are trying to work out a plan for  safety for the girl when she comes home. Visit Diagnosis:    ICD-10-CM   1. Bipolar 1 disorder, depressed, moderate (HCC)  F31.32     2. Generalized anxiety disorder  F41.1       Past Psychiatric History: Long-term outpatient treatment  Past Medical History:  Past Medical History:  Diagnosis Date   Angio-edema    Anxiety    Bipolar disorder (HCC)    Depression    Headache(784.0)    Irritable bowel    Urticaria     Past Surgical History:  Procedure Laterality Date   BIOPSY  07/17/2022   Procedure: BIOPSY;  Surgeon: Lanelle Bal, DO;  [facility];  Service: Endoscopy;;   COLONOSCOPY WITH PROPOFOL N/A 07/17/2022   non-bleeding internal hemorrhoids, one 5 mm polyp (tubular adenoma) 7 year surveillance.   ESOPHAGOGASTRODUODENOSCOPY (EGD) WITH PROPOFOL N/A 07/17/2022   small hiatal hernia, gastritis, normal duodenum. negative H.pylori. negative Barrett's.   FOOT SURGERY      Family Psychiatric History: See below  Family History:  Family History  Problem Relation Age of Onset   Bipolar disorder Mother    Depression Maternal Aunt    Depression Maternal Uncle    Urticaria Neg Hx    Immunodeficiency Neg Hx    Eczema Neg Hx    Atopy Neg Hx    Asthma Neg Hx    Angioedema Neg Hx    Allergic rhinitis Neg Hx     Social History:  Social History  Socioeconomic History   Marital status: Divorced    Spouse name: Not on file   Number of children: Not on file   Years of education: Not on file   Highest education level: Not on file  Occupational History   Not on file  Tobacco Use   Smoking status: Never    Passive exposure: Past   Smokeless tobacco: Never  Vaping Use   Vaping status: Never Used  Substance and Sexual Activity   Alcohol use: Yes    Comment: Rare glass of wine   Drug use: Yes    Types: Marijuana    Comment: smokes cannabis for anxiety    Sexual activity: Not Currently    Partners: Male    Birth control/protection: Pill  Other  Topics Concern   Not on file  Social History Narrative   Not on file   Social Determinants of Health   Financial Resource Strain: Not on file  Food Insecurity: Not on file  Transportation Needs: Not on file  Physical Activity: Not on file  Stress: Not on file  Social Connections: Not on file    Allergies:  Allergies  Allergen Reactions   Voltaren [Diclofenac] Anaphylaxis, Hives and Swelling   Clonazepam Nausea And Vomiting    Migraine   Sulfa Antibiotics Hives   Viibryd [Vilazodone Hcl]     Stomach issues     Metabolic Disorder Labs: No results found for: "HGBA1C", "MPG" No results found for: "PROLACTIN" No results found for: "CHOL", "TRIG", "HDL", "CHOLHDL", "VLDL", "LDLCALC" No results found for: "TSH"  Therapeutic Level Labs: No results found for: "LITHIUM" Lab Results  Component Value Date   VALPROATE 33.8 (L) 03/13/2013   No results found for: "CBMZ"  Current Medications: Current Outpatient Medications  Medication Sig Dispense Refill   ALPRAZolam (XANAX) 1 MG tablet Take 1 tablet (1 mg total) by mouth 4 (four) times daily as needed. 120 tablet 2   desvenlafaxine (PRISTIQ) 50 MG 24 hr tablet Take 1 tablet (50 mg total) by mouth daily. 30 tablet 3   diazepam (VALIUM) 5 MG tablet Take 1 tablet (5 mg total) by mouth 4 (four) times daily as needed for anxiety. 60 tablet 0   metoprolol succinate (TOPROL-XL) 25 MG 24 hr tablet Take 12.5 mg by mouth daily.     OLANZapine (ZYPREXA) 5 MG tablet Take 1 tablet (5 mg total) by mouth at bedtime. 90 tablet 0   pantoprazole (PROTONIX) 40 MG tablet Take 1 tablet (40 mg total) by mouth 2 (two) times daily before a meal. 60 tablet 11   progesterone (PROMETRIUM) 200 MG capsule Take 200 mg by mouth at bedtime.     rosuvastatin (CRESTOR) 5 MG tablet Take 5 mg by mouth every evening.     valACYclovir (VALTREX) 500 MG tablet Take 500 mg by mouth daily.     No current facility-administered medications for this visit.      Musculoskeletal: Strength & Muscle Tone: within normal limits Gait & Station: normal Patient leans: N/A  Psychiatric Specialty Exam: Review of Systems  All other systems reviewed and are negative.   There were no vitals taken for this visit.There is no height or weight on file to calculate BMI.  General Appearance: Casual and Fairly Groomed  Eye Contact:  Good  Speech:  Clear and Coherent  Volume:  Normal  Mood:  Euthymic  Affect:  Congruent  Thought Process:  Goal Directed  Orientation:  Full (Time, Place, and Person)  Thought Content: WDL  Suicidal Thoughts:  No  Homicidal Thoughts:  No  Memory:  Immediate;   Good Recent;   Good Remote;   Good  Judgement:  Good  Insight:  Fair  Psychomotor Activity:  Normal  Concentration:  Concentration: Good and Attention Span: Good  Recall:  Good  Fund of Knowledge: Good  Language: Good  Akathisia:  No  Handed:  Right  AIMS (if indicated): not done  Assets:  Communication Skills Desire for Improvement Physical Health Resilience Social Support Talents/Skills  ADL's:  Intact  Cognition: WNL  Sleep:  Good   Screenings: MDI    Flowsheet Row Office Visit from 11/22/2015 in [facility] at [place]  Total Score (max 50) 38      PHQ2-9    Flowsheet Row Counselor from 02/01/2022 in BEHAVIORAL HEALTH INTENSIVE PSYCH Most recent reading at 02/05/2022  2:58 PM Video Visit from 02/05/2022 in [facility] at [place] Most recent reading at 02/05/2022  1:31 PM Video Visit from 01/04/2022 in [facility] at [place] Most recent reading at 01/04/2022  2:35 PM Video Visit from 11/06/2021 in [facility] at [place] Most recent reading at 11/06/2021 10:40 AM Video Visit from 09/12/2021 in [facility] Out[facility] at [place] Most recent reading at 09/12/2021  4:08 PM  PHQ-2 Total Score 6 4 5 1 4   PHQ-9  Total Score 25 15 10  -- 11      Flowsheet Row Admission (Discharged) from 07/17/2022 in [place] [facility] Most recent reading at 07/17/2022  9:41 AM Counselor from 02/01/2022 in BEHAVIORAL HEALTH INTENSIVE PSYCH Most recent reading at 02/05/2022  2:58 PM Video Visit from 02/05/2022 in [facility] at [place] Most recent reading at 02/05/2022  1:31 PM  C-SSRS RISK CATEGORY No Risk Error: Question 6 not populated Error: Q3, 4, or 5 should not be populated when Q2 is No        Assessment and Plan: This patient is a 52 year old female with a history of bipolar disorder and anxiety.  Despite the recent stressors with her daughter she continues to do well.  She will continue Pristiq 50 mg daily for depression and anxiety, olanzapine 5 mg at bedtime for mood stabilization and Xanax 1 mg up to 4 times daily for anxiety.  She will return to see me in 2 months  Collaboration of Care: Collaboration of Care: Primary Care Provider AEB notes will be shared with PCP at patient's request  Patient/Guardian was advised Release of Information must be obtained prior to any record release in order to collaborate their care with an outside provider. Patient/Guardian was advised if they have not already done so to contact the registration department to sign all necessary forms in order for [place] to release information regarding their care.   Consent: Patient/Guardian gives verbal consent for treatment and assignment of benefits for services provided during this visit. Patient/Guardian expressed understanding and agreed to proceed.    Diannia Ruder, MD 01/29/2023, 10:31 AM

## 2023-01-31 DIAGNOSIS — M7542 Impingement syndrome of left shoulder: Secondary | ICD-10-CM | POA: Diagnosis not present

## 2023-02-12 DIAGNOSIS — M7542 Impingement syndrome of left shoulder: Secondary | ICD-10-CM | POA: Diagnosis not present

## 2023-02-13 DIAGNOSIS — I781 Nevus, non-neoplastic: Secondary | ICD-10-CM | POA: Diagnosis not present

## 2023-02-13 DIAGNOSIS — Z1283 Encounter for screening for malignant neoplasm of skin: Secondary | ICD-10-CM | POA: Diagnosis not present

## 2023-02-13 DIAGNOSIS — E7800 Pure hypercholesterolemia, unspecified: Secondary | ICD-10-CM | POA: Diagnosis not present

## 2023-02-13 DIAGNOSIS — I471 Supraventricular tachycardia, unspecified: Secondary | ICD-10-CM | POA: Diagnosis not present

## 2023-02-13 DIAGNOSIS — L570 Actinic keratosis: Secondary | ICD-10-CM | POA: Diagnosis not present

## 2023-02-13 DIAGNOSIS — D485 Neoplasm of uncertain behavior of skin: Secondary | ICD-10-CM | POA: Diagnosis not present

## 2023-02-13 DIAGNOSIS — Z9189 Other specified personal risk factors, not elsewhere classified: Secondary | ICD-10-CM | POA: Diagnosis not present

## 2023-02-13 DIAGNOSIS — F319 Bipolar disorder, unspecified: Secondary | ICD-10-CM | POA: Diagnosis not present

## 2023-02-13 DIAGNOSIS — Z299 Encounter for prophylactic measures, unspecified: Secondary | ICD-10-CM | POA: Diagnosis not present

## 2023-02-14 DIAGNOSIS — M7542 Impingement syndrome of left shoulder: Secondary | ICD-10-CM | POA: Diagnosis not present

## 2023-02-21 DIAGNOSIS — M7542 Impingement syndrome of left shoulder: Secondary | ICD-10-CM | POA: Diagnosis not present

## 2023-02-27 DIAGNOSIS — M25512 Pain in left shoulder: Secondary | ICD-10-CM | POA: Diagnosis not present

## 2023-02-27 DIAGNOSIS — M542 Cervicalgia: Secondary | ICD-10-CM | POA: Diagnosis not present

## 2023-03-04 DIAGNOSIS — M7542 Impingement syndrome of left shoulder: Secondary | ICD-10-CM | POA: Diagnosis not present

## 2023-03-07 DIAGNOSIS — M7542 Impingement syndrome of left shoulder: Secondary | ICD-10-CM | POA: Diagnosis not present

## 2023-03-14 DIAGNOSIS — M25512 Pain in left shoulder: Secondary | ICD-10-CM | POA: Diagnosis not present

## 2023-03-14 DIAGNOSIS — M25511 Pain in right shoulder: Secondary | ICD-10-CM | POA: Diagnosis not present

## 2023-03-14 DIAGNOSIS — M7541 Impingement syndrome of right shoulder: Secondary | ICD-10-CM | POA: Diagnosis not present

## 2023-03-14 DIAGNOSIS — M7542 Impingement syndrome of left shoulder: Secondary | ICD-10-CM | POA: Diagnosis not present

## 2023-03-18 DIAGNOSIS — M7542 Impingement syndrome of left shoulder: Secondary | ICD-10-CM | POA: Diagnosis not present

## 2023-03-20 DIAGNOSIS — M542 Cervicalgia: Secondary | ICD-10-CM | POA: Diagnosis not present

## 2023-03-20 DIAGNOSIS — M7542 Impingement syndrome of left shoulder: Secondary | ICD-10-CM | POA: Diagnosis not present

## 2023-03-29 ENCOUNTER — Encounter (HOSPITAL_COMMUNITY): Payer: Self-pay

## 2023-03-29 ENCOUNTER — Telehealth (INDEPENDENT_AMBULATORY_CARE_PROVIDER_SITE_OTHER): Payer: Medicare HMO | Admitting: Psychiatry

## 2023-03-29 ENCOUNTER — Encounter (HOSPITAL_COMMUNITY): Payer: Self-pay | Admitting: Psychiatry

## 2023-03-29 DIAGNOSIS — F411 Generalized anxiety disorder: Secondary | ICD-10-CM

## 2023-03-29 DIAGNOSIS — F32A Depression, unspecified: Secondary | ICD-10-CM | POA: Diagnosis not present

## 2023-03-29 DIAGNOSIS — F3132 Bipolar disorder, current episode depressed, moderate: Secondary | ICD-10-CM

## 2023-03-29 MED ORDER — OLANZAPINE 5 MG PO TABS: 5.0000 mg | ORAL_TABLET | Freq: Every day | ORAL | 0 refills | Status: DC

## 2023-03-29 MED ORDER — ALPRAZOLAM 1 MG PO TABS: 1.0000 mg | ORAL_TABLET | Freq: Four times a day (QID) | ORAL | 2 refills | Status: DC | PRN

## 2023-03-29 MED ORDER — DESVENLAFAXINE SUCCINATE ER 50 MG PO TB24: 50.0000 mg | ORAL_TABLET | Freq: Every day | ORAL | 3 refills | Status: DC

## 2023-03-29 NOTE — Progress Notes (Signed)
Virtual Visit via Video Note  I connected with Samantha Chambers on 03/29/23 at 10:20 AM EST by a video enabled telemedicine application and verified that I am speaking with the correct person using two identifiers.  Location: Patient: home Provider: office   I discussed the limitations of evaluation and management by telemedicine and the availability of in person appointments. The patient expressed understanding and agreed to proceed.     I discussed the assessment and treatment plan with the patient. The patient was provided an opportunity to ask questions and all were answered. The patient agreed with the plan and demonstrated an understanding of the instructions.   The patient was advised to call back or seek an in-person evaluation if the symptoms worsen or if the condition fails to improve as anticipated.  I provided 20 minutes of non-face-to-face time during this encounter.   Diannia Ruder, MD  [facility] MD/PA/NP OP Progress Note  03/29/2023 10:38 AM Samantha Chambers  MRN:  [id number]  Chief Complaint:  Chief Complaint  Patient presents with   Anxiety   Depression   Follow-up   HPI: This patient is a 52 year old divorced white female who lives with her teenage daughter in [place]. She is on disability.   Patient returns for follow-up after 2 months regarding her depression anxiety and possible bipolar disorder.  Overall she is doing fairly well.  Her teenage daughter is somewhat more stable although she tends to be irritable and swollen.  The patient states that she is just trying to stay busy.  She denies significant depression anxiety thoughts of self-harm or difficulty sleeping. Visit Diagnosis:    ICD-10-CM   1. Bipolar 1 disorder, depressed, moderate (HCC)  F31.32     2. Generalized anxiety disorder  F41.1       Past Psychiatric History: Long-term outpatient treatment  Past Medical History:  Past Medical History:  Diagnosis Date   Angio-edema    Anxiety    Bipolar  disorder (HCC)    Depression    Headache(784.0)    Irritable bowel    Urticaria     Past Surgical History:  Procedure Laterality Date   BIOPSY  07/17/2022   Procedure: BIOPSY;  Surgeon: Lanelle Bal, DO;  [facility];  Service: Endoscopy;;   COLONOSCOPY WITH PROPOFOL N/A 07/17/2022   non-bleeding internal hemorrhoids, one 5 mm polyp (tubular adenoma) 7 year surveillance.   ESOPHAGOGASTRODUODENOSCOPY (EGD) WITH PROPOFOL N/A 07/17/2022   small hiatal hernia, gastritis, normal duodenum. negative H.pylori. negative Barrett's.   FOOT SURGERY      Family Psychiatric History: See below  Family History:  Family History  Problem Relation Age of Onset   Bipolar disorder Mother    Depression Maternal Aunt    Depression Maternal Uncle    Urticaria Neg Hx    Immunodeficiency Neg Hx    Eczema Neg Hx    Atopy Neg Hx    Asthma Neg Hx    Angioedema Neg Hx    Allergic rhinitis Neg Hx     Social History:  Social History   Socioeconomic History   Marital status: Divorced    Spouse name: Not on file   Number of children: Not on file   Years of education: Not on file   Highest education level: Not on file  Occupational History   Not on file  Tobacco Use   Smoking status: Never    Passive exposure: Past   Smokeless tobacco: Never  Vaping Use  Vaping status: Never Used  Substance and Sexual Activity   Alcohol use: Yes    Comment: Rare glass of wine   Drug use: Yes    Types: Marijuana    Comment: smokes cannabis for anxiety    Sexual activity: Not Currently    Partners: Male    Birth control/protection: Pill  Other Topics Concern   Not on file  Social History Narrative   Not on file   Social Determinants of Health   Financial Resource Strain: Not on file  Food Insecurity: Not on file  Transportation Needs: Not on file  Physical Activity: Not on file  Stress: Not on file  Social Connections: Not on file    Allergies:  Allergies  Allergen Reactions    Voltaren [Diclofenac] Anaphylaxis, Hives and Swelling   Clonazepam Nausea And Vomiting    Migraine   Sulfa Antibiotics Hives   Viibryd [Vilazodone Hcl]     Stomach issues     Metabolic Disorder Labs: No results found for: "HGBA1C", "MPG" No results found for: "PROLACTIN" No results found for: "CHOL", "TRIG", "HDL", "CHOLHDL", "VLDL", "LDLCALC" No results found for: "TSH"  Therapeutic Level Labs: No results found for: "LITHIUM" Lab Results  Component Value Date   VALPROATE 33.8 (L) 03/13/2013   No results found for: "CBMZ"  Current Medications: Current Outpatient Medications  Medication Sig Dispense Refill   ALPRAZolam (XANAX) 1 MG tablet Take 1 tablet (1 mg total) by mouth 4 (four) times daily as needed. 120 tablet 2   desvenlafaxine (PRISTIQ) 50 MG 24 hr tablet Take 1 tablet (50 mg total) by mouth daily. 30 tablet 3   diazepam (VALIUM) 5 MG tablet Take 1 tablet (5 mg total) by mouth 4 (four) times daily as needed for anxiety. 60 tablet 0   metoprolol succinate (TOPROL-XL) 25 MG 24 hr tablet Take 12.5 mg by mouth daily.     OLANZapine (ZYPREXA) 5 MG tablet Take 1 tablet (5 mg total) by mouth at bedtime. 90 tablet 0   pantoprazole (PROTONIX) 40 MG tablet Take 1 tablet (40 mg total) by mouth 2 (two) times daily before a meal. 60 tablet 11   progesterone (PROMETRIUM) 200 MG capsule Take 200 mg by mouth at bedtime.     rosuvastatin (CRESTOR) 5 MG tablet Take 5 mg by mouth every evening.     valACYclovir (VALTREX) 500 MG tablet Take 500 mg by mouth daily.     No current facility-administered medications for this visit.     Musculoskeletal: Strength & Muscle Tone: within normal limits Gait & Station: normal Patient leans: N/A  Psychiatric Specialty Exam: Review of Systems  Musculoskeletal:  Positive for arthralgias.  All other systems reviewed and are negative.   There were no vitals taken for this visit.There is no height or weight on file to calculate BMI.  General  Appearance: Casual and Fairly Groomed  Eye Contact:  Good  Speech:  Clear and Coherent  Volume:  Normal  Mood:  Euthymic  Affect:  Congruent  Thought Process:  Goal Directed  Orientation:  Full (Time, Place, and Person)  Thought Content: WDL   Suicidal Thoughts:  No  Homicidal Thoughts:  No  Memory:  Immediate;   Good Recent;   Good Remote;   Good  Judgement:  Good  Insight:  Good  Psychomotor Activity:  Normal  Concentration:  Concentration: Good and Attention Span: Good  Recall:  Good  Fund of Knowledge: Good  Language: Good  Akathisia:  No  Handed:  Right  AIMS (if indicated): not done  Assets:  Communication Skills Desire for Improvement Physical Health Resilience Social Support  ADL's:  Intact  Cognition: WNL  Sleep:  Good   Screenings: MDI    Flowsheet Row Office Visit from 11/22/2015 in [facility]  Total Score (max 50) 38      PHQ2-9    Flowsheet Row Counselor from 02/01/2022 in BEHAVIORAL HEALTH INTENSIVE PSYCH Most recent reading at 02/05/2022  2:58 PM Video Visit from 02/05/2022 in [facility] at [place] Most recent reading at 02/05/2022  1:31 PM Video Visit from 01/04/2022 in [facility] at [place] Most recent reading at 01/04/2022  2:35 PM Video Visit from 11/06/2021 in [facility] Outpatient Behavioral Health at [place] Most recent reading at 11/06/2021 10:40 AM Video Visit from 09/12/2021 in [facility] at [place] Most recent reading at 09/12/2021  4:08 PM  PHQ-2 Total Score 6 4 5 1 4   PHQ-9 Total Score 25 15 10  -- 11      Flowsheet Row Admission (Discharged) from 07/17/2022 in [facility] Most recent reading at 07/17/2022  9:41 AM Counselor from 02/01/2022 in BEHAVIORAL HEALTH INTENSIVE PSYCH Most recent reading at 02/05/2022  2:58 PM Video Visit from 02/05/2022 in [facility] Out[facility] Behavioral Health at  [place] Most recent reading at 02/05/2022  1:31 PM  C-SSRS RISK CATEGORY No Risk Error: Question 6 not populated Error: Q3, 4, or 5 should not be populated when Q2 is No        Assessment and Plan: This patient is a 52 year old female with a history of depression possible bipolar disorder and anxiety.  She continues to do well.  She will continue Pristiq 50 mg for depression and anxiety, olanzapine 5 mg at bedtime for mood stabilization and Xanax 1 mg up to 4 times daily as needed for anxiety.  She will return to see me in 2 months  Collaboration of Care: Collaboration of Care: Primary Care Provider AEB notes will be shared with PCP at patient's request  Patient/Guardian was advised Release of Information must be obtained prior to any record release in order to collaborate their care with an outside provider. Patient/Guardian was advised if they have not already done so to contact the registration department to sign all necessary forms in order for [place] to release information regarding their care.   Consent: Patient/Guardian gives verbal consent for treatment and assignment of benefits for services provided during this visit. Patient/Guardian expressed understanding and agreed to proceed.    Diannia Ruder, MD 03/29/2023, 10:38 AM

## 2023-04-01 DIAGNOSIS — M7542 Impingement syndrome of left shoulder: Secondary | ICD-10-CM | POA: Diagnosis not present

## 2023-04-02 ENCOUNTER — Telehealth (HOSPITAL_COMMUNITY): Payer: Self-pay

## 2023-04-02 NOTE — Telephone Encounter (Signed)
Pt called in requesting a letter to be excuse from having to do jury duty due to her diagnosis she has a date of 04/15/23. Please advise.

## 2023-04-03 ENCOUNTER — Encounter (HOSPITAL_COMMUNITY): Payer: Self-pay | Admitting: Psychiatry

## 2023-04-03 NOTE — Telephone Encounter (Signed)
Spoke with pt advised letter ready she will come pick it up next week

## 2023-04-03 NOTE — Telephone Encounter (Signed)
Called pt to let her know letter is ready no answer left vm

## 2023-04-03 NOTE — Telephone Encounter (Signed)
completed

## 2023-04-10 DIAGNOSIS — M7542 Impingement syndrome of left shoulder: Secondary | ICD-10-CM | POA: Diagnosis not present

## 2023-04-17 DIAGNOSIS — M7542 Impingement syndrome of left shoulder: Secondary | ICD-10-CM | POA: Diagnosis not present

## 2023-05-16 DIAGNOSIS — R891 Abnormal level of hormones in specimens from other organs, systems and tissues: Secondary | ICD-10-CM | POA: Diagnosis not present

## 2023-05-16 DIAGNOSIS — L658 Other specified nonscarring hair loss: Secondary | ICD-10-CM | POA: Diagnosis not present

## 2023-05-16 DIAGNOSIS — R5382 Chronic fatigue, unspecified: Secondary | ICD-10-CM | POA: Diagnosis not present

## 2023-05-24 ENCOUNTER — Other Ambulatory Visit (HOSPITAL_COMMUNITY): Payer: Self-pay | Admitting: Psychiatry

## 2023-05-28 ENCOUNTER — Encounter (HOSPITAL_COMMUNITY): Payer: Self-pay | Admitting: Psychiatry

## 2023-05-28 ENCOUNTER — Telehealth (INDEPENDENT_AMBULATORY_CARE_PROVIDER_SITE_OTHER): Payer: No Typology Code available for payment source | Admitting: Psychiatry

## 2023-05-28 DIAGNOSIS — F3132 Bipolar disorder, current episode depressed, moderate: Secondary | ICD-10-CM

## 2023-05-28 DIAGNOSIS — F411 Generalized anxiety disorder: Secondary | ICD-10-CM | POA: Diagnosis not present

## 2023-05-28 MED ORDER — DESVENLAFAXINE SUCCINATE ER 50 MG PO TB24: 50.0000 mg | ORAL_TABLET | Freq: Every day | ORAL | 3 refills | Status: DC

## 2023-05-28 MED ORDER — OLANZAPINE 5 MG PO TABS: 5.0000 mg | ORAL_TABLET | Freq: Every day | ORAL | 0 refills | Status: DC

## 2023-05-28 MED ORDER — ALPRAZOLAM 1 MG PO TABS: 1.0000 mg | ORAL_TABLET | Freq: Four times a day (QID) | ORAL | 2 refills | Status: DC | PRN

## 2023-05-28 NOTE — Progress Notes (Signed)
Virtual Visit via Video Note  I connected with Samantha Chambers on 05/28/23 at  3:40 PM EST by a video enabled telemedicine application and verified that I am speaking with the correct person using two identifiers.  Location: Patient: home Provider: office   I discussed the limitations of evaluation and management by telemedicine and the availability of in person appointments. The patient expressed understanding and agreed to proceed.    I discussed the assessment and treatment plan with the patient. The patient was provided an opportunity to ask questions and all were answered. The patient agreed with the plan and demonstrated an understanding of the instructions.   The patient was advised to call back or seek an in-person evaluation if the symptoms worsen or if the condition fails to improve as anticipated.  I provided 20 minutes of non-face-to-face time during this encounter.   Diannia Ruder, MD  [facility] MD/PA/NP OP Progress Note  05/28/2023 4:07 PM Samantha Chambers  MRN:  [id number]  Chief Complaint:  Chief Complaint  Patient presents with   Depression   Anxiety   Follow-up   HPI: This patient is a 53 year old divorced white female who lives with her teenage daughter in [place]. She is on disability.   The patient returns for follow-up after 2 months regarding her depression anxiety and possible bipolar disorder.  Overall she states she is doing fairly well.  She has fallen away from going to the gym but is going to try to pick it back up again.  She states her mood has been stable and she denies significant depression anxiety or manic symptoms.  Her anxiety is under good control with the Xanax.  Her teenage daughter it continues to be difficult but she seems to be handling it somewhat better. Visit Diagnosis:    ICD-10-CM   1. Bipolar 1 disorder, depressed, moderate (HCC)  F31.32     2. Generalized anxiety disorder  F41.1       Past Psychiatric History: Long-term outpatient  treatment  Past Medical History:  Past Medical History:  Diagnosis Date   Angio-edema    Anxiety    Bipolar disorder (HCC)    Depression    Headache(784.0)    Irritable bowel    Urticaria     Past Surgical History:  Procedure Laterality Date   BIOPSY  07/17/2022   Procedure: BIOPSY;  Surgeon: Lanelle Bal, DO;  [facility];  Service: Endoscopy;;   COLONOSCOPY WITH PROPOFOL N/A 07/17/2022   non-bleeding internal hemorrhoids, one 5 mm polyp (tubular adenoma) 7 year surveillance.   ESOPHAGOGASTRODUODENOSCOPY (EGD) WITH PROPOFOL N/A 07/17/2022   small hiatal hernia, gastritis, normal duodenum. negative H.pylori. negative Barrett's.   FOOT SURGERY      Family Psychiatric History: See below  Family History:  Family History  Problem Relation Age of Onset   Bipolar disorder Mother    Depression Maternal Aunt    Depression Maternal Uncle    Urticaria Neg Hx    Immunodeficiency Neg Hx    Eczema Neg Hx    Atopy Neg Hx    Asthma Neg Hx    Angioedema Neg Hx    Allergic rhinitis Neg Hx     Social History:  Social History   Socioeconomic History   Marital status: Divorced    Spouse name: Not on file   Number of children: Not on file   Years of education: Not on file   Highest education level: Not on file  Occupational History  Not on file  Tobacco Use   Smoking status: Never    Passive exposure: Past   Smokeless tobacco: Never  Vaping Use   Vaping status: Never Used  Substance and Sexual Activity   Alcohol use: Yes    Comment: Rare glass of wine   Drug use: Yes    Types: Marijuana    Comment: smokes cannabis for anxiety    Sexual activity: Not Currently    Partners: Male    Birth control/protection: Pill  Other Topics Concern   Not on file  Social History Narrative   Not on file   Social Drivers of Health   Financial Resource Strain: Not on file  Food Insecurity: Not on file  Transportation Needs: Not on file  Physical Activity: Not on  file  Stress: Not on file  Social Connections: Not on file    Allergies:  Allergies  Allergen Reactions   Voltaren [Diclofenac] Anaphylaxis, Hives and Swelling   Clonazepam Nausea And Vomiting    Migraine   Sulfa Antibiotics Hives   Viibryd [Vilazodone Hcl]     Stomach issues     Metabolic Disorder Labs: No results found for: "HGBA1C", "MPG" No results found for: "PROLACTIN" No results found for: "CHOL", "TRIG", "HDL", "CHOLHDL", "VLDL", "LDLCALC" No results found for: "TSH"  Therapeutic Level Labs: No results found for: "LITHIUM" Lab Results  Component Value Date   VALPROATE 33.8 (L) 03/13/2013   No results found for: "CBMZ"  Current Medications: Current Outpatient Medications  Medication Sig Dispense Refill   ALPRAZolam (XANAX) 1 MG tablet Take 1 tablet (1 mg total) by mouth 4 (four) times daily as needed. 120 tablet 2   desvenlafaxine (PRISTIQ) 50 MG 24 hr tablet Take 1 tablet (50 mg total) by mouth daily. 30 tablet 3   diazepam (VALIUM) 5 MG tablet Take 1 tablet (5 mg total) by mouth 4 (four) times daily as needed for anxiety. 60 tablet 0   metoprolol succinate (TOPROL-XL) 25 MG 24 hr tablet Take 12.5 mg by mouth daily.     OLANZapine (ZYPREXA) 5 MG tablet Take 1 tablet (5 mg total) by mouth at bedtime. 90 tablet 0   pantoprazole (PROTONIX) 40 MG tablet Take 1 tablet (40 mg total) by mouth 2 (two) times daily before a meal. 60 tablet 11   progesterone (PROMETRIUM) 200 MG capsule Take 200 mg by mouth at bedtime.     rosuvastatin (CRESTOR) 5 MG tablet Take 5 mg by mouth every evening.     valACYclovir (VALTREX) 500 MG tablet Take 500 mg by mouth daily.     No current facility-administered medications for this visit.     Musculoskeletal: Strength & Muscle Tone: within normal limits Gait & Station: normal Patient leans: N/A  Psychiatric Specialty Exam: Review of Systems  All other systems reviewed and are negative.   There were no vitals taken for this  visit.There is no height or weight on file to calculate BMI.  General Appearance: Casual and Fairly Groomed  Eye Contact:  Good  Speech:  Clear and Coherent  Volume:  Normal  Mood:  Euthymic  Affect:  Congruent  Thought Process:  Goal Directed  Orientation:  Full (Time, Place, and Person)  Thought Content: WDL   Suicidal Thoughts:  No  Homicidal Thoughts:  No  Memory:  Immediate;   Good Recent;   Good Remote;   Good  Judgement:  Good  Insight:  Good  Psychomotor Activity:  Normal  Concentration:  Concentration: Good and  Attention Span: Good  Recall:  Good  Fund of Knowledge: Good  Language: Good  Akathisia:  No  Handed:  Right  AIMS (if indicated): not done  Assets:  Communication Skills Desire for Improvement Physical Health Resilience Social Support Talents/Skills  ADL's:  Intact  Cognition: WNL  Sleep:  Good   Screenings: MDI    Flowsheet Row Office Visit from 11/22/2015 in [facility] at [place]  Total Score (max 50) 38      PHQ2-9    Flowsheet Row Counselor from 02/01/2022 in BEHAVIORAL HEALTH INTENSIVE PSYCH Most recent reading at 02/05/2022  2:58 PM Video Visit from 02/05/2022 in [facility] Outpatient [facility]al [facility] at [place] Most recent reading at 02/05/2022  1:31 PM Video Visit from 01/04/2022 in [facility] Outpatient Behavior[facility] at [place] Most recent reading at 01/04/2022  2:35 PM Video Visit from 11/06/2021 in [facility] Outpatient Behavioral Health at [place] Most recent reading at 11/06/2021 10:40 AM Video Visit from 09/12/2021 in [facility] Outpatient Behavioral Health at [place] Most recent reading at 09/12/2021  4:08 PM  PHQ-2 Total Score 6 4 5 1 4   PHQ-9 Total Score 25 15 10  -- 11      Flowsheet Row Admission (Discharged) from 07/17/2022 in [place] [facility] Most recent reading at 07/17/2022  9:41 AM Counselor from 02/01/2022 in BEHAVIORAL HEALTH INTENSIVE PSYCH Most recent reading at 02/05/2022   2:58 PM Video Visit from 02/05/2022 in [facility] Outpatient Behavioral Health at [place] Most recent reading at 02/05/2022  1:31 PM  C-SSRS RISK CATEGORY No Risk Error: Question 6 not populated Error: Q3, 4, or 5 should not be populated when Q2 is No        Assessment and Plan: This patient is a 53 year old female with a history of depression, possible bipolar disorder and anxiety.  She continues to do well on her current regimen.  She will continue Pristiq 50 mg daily for depression and anxiety, olanzapine 5 mg at bedtime for mood stabilization and Xanax 1 mg up to 4 times daily as needed for anxiety.  She will return to see me in 2 months  Collaboration of Care: Collaboration of Care: Primary Care Provider AEB notes will be shared with PCP at patient's request  Patient/Guardian was advised Release of Information must be obtained prior to any record release in order to collaborate their care with an outside provider. Patient/Guardian was advised if they have not already done so to contact the registration department to sign all necessary forms in order for [place] to release information regarding their care.   Consent: Patient/Guardian gives verbal consent for treatment and assignment of benefits for services provided during this visit. Patient/Guardian expressed understanding and agreed to proceed.    Diannia Ruder, MD 05/28/2023, 4:07 PM

## 2023-06-03 DIAGNOSIS — Z299 Encounter for prophylactic measures, unspecified: Secondary | ICD-10-CM | POA: Diagnosis not present

## 2023-06-03 DIAGNOSIS — R0602 Shortness of breath: Secondary | ICD-10-CM | POA: Diagnosis not present

## 2023-06-03 DIAGNOSIS — M545 Low back pain, unspecified: Secondary | ICD-10-CM | POA: Diagnosis not present

## 2023-07-02 DIAGNOSIS — T148XXA Other injury of unspecified body region, initial encounter: Secondary | ICD-10-CM | POA: Diagnosis not present

## 2023-07-02 DIAGNOSIS — M545 Low back pain, unspecified: Secondary | ICD-10-CM | POA: Diagnosis not present

## 2023-07-02 DIAGNOSIS — Z299 Encounter for prophylactic measures, unspecified: Secondary | ICD-10-CM | POA: Diagnosis not present

## 2023-07-02 DIAGNOSIS — R252 Cramp and spasm: Secondary | ICD-10-CM | POA: Diagnosis not present

## 2023-07-16 DIAGNOSIS — M7542 Impingement syndrome of left shoulder: Secondary | ICD-10-CM | POA: Diagnosis not present

## 2023-07-18 DIAGNOSIS — M7542 Impingement syndrome of left shoulder: Secondary | ICD-10-CM | POA: Diagnosis not present

## 2023-07-23 DIAGNOSIS — M7542 Impingement syndrome of left shoulder: Secondary | ICD-10-CM | POA: Diagnosis not present

## 2023-07-25 DIAGNOSIS — M7542 Impingement syndrome of left shoulder: Secondary | ICD-10-CM | POA: Diagnosis not present

## 2023-07-26 ENCOUNTER — Telehealth (HOSPITAL_COMMUNITY): Admitting: Psychiatry

## 2023-07-26 ENCOUNTER — Encounter (HOSPITAL_COMMUNITY): Payer: Self-pay | Admitting: Psychiatry

## 2023-07-26 DIAGNOSIS — F411 Generalized anxiety disorder: Secondary | ICD-10-CM | POA: Diagnosis not present

## 2023-07-26 DIAGNOSIS — F3132 Bipolar disorder, current episode depressed, moderate: Secondary | ICD-10-CM

## 2023-07-26 MED ORDER — OLANZAPINE 5 MG PO TABS: 5.0000 mg | ORAL_TABLET | Freq: Every day | ORAL | 0 refills | Status: DC

## 2023-07-26 MED ORDER — ALPRAZOLAM 1 MG PO TABS: 1.0000 mg | ORAL_TABLET | Freq: Four times a day (QID) | ORAL | 2 refills | Status: DC | PRN

## 2023-07-26 MED ORDER — DESVENLAFAXINE SUCCINATE ER 50 MG PO TB24: 50.0000 mg | ORAL_TABLET | Freq: Every day | ORAL | 3 refills | Status: DC

## 2023-07-26 NOTE — Progress Notes (Signed)
 Virtual Visit via Video Note  I connected with Samantha Chambers on 07/26/23 at 10:40 AM EDT by a video enabled telemedicine application and verified that I am speaking with the correct person using two identifiers.  Location: Patient: home Provider: office   I discussed the limitations of evaluation and management by telemedicine and the availability of in person appointments. The patient expressed understanding and agreed to proceed.     I discussed the assessment and treatment plan with the patient. The patient was provided an opportunity to ask questions and all were answered. The patient agreed with the plan and demonstrated an understanding of the instructions.   The patient was advised to call back or seek an in-person evaluation if the symptoms worsen or if the condition fails to improve as anticipated.  I provided 20 minutes of non-face-to-face time during this encounter.   Diannia Ruder, MD  [facility] MD/PA/NP OP Progress Note  07/26/2023 11:02 AM Samantha Chambers  MRN:  [id number]  Chief Complaint:  Chief Complaint  Patient presents with   Anxiety   Depression   Follow-up   HPI: This patient is a 53 year old divorced white female who lives with her teenage daughter in [place].  She is on disability.  The patient returns for follow-up after 2 months regarding her depression anxiety and possible bipolar disorder.  Overall the patient states she is doing okay.  However she is very stressed by her 59 year old daughter whom she states is very disrespectful and acting erratically.  The daughter goes between her home and her ex-husband's home.  Right now the ex-husband has thrown the girl out because of her behavior and she is staying with the patient again.  The patient does not really know what to do with her but she is trying to just get through the next few months till the girl turns 71 and can start community college and start making her own way and life.  Overall she states that  her mood has been stable and she denies significant depression anxiety thoughts of self-harm or suicide and she is sleeping well. Visit Diagnosis:    ICD-10-CM   1. Bipolar 1 disorder, depressed, moderate (HCC)  F31.32     2. Generalized anxiety disorder  F41.1       Past Psychiatric History: Long-term outpatient treatment  Past Medical History:  Past Medical History:  Diagnosis Date   Angio-edema    Anxiety    Bipolar disorder (HCC)    Depression    Headache(784.0)    Irritable bowel    Urticaria     Past Surgical History:  Procedure Laterality Date   BIOPSY  07/17/2022   Procedure: BIOPSY;  Surgeon: Lanelle Bal, DO;  [facility];  Service: Endoscopy;;   COLONOSCOPY WITH PROPOFOL N/A 07/17/2022   non-bleeding internal hemorrhoids, one 5 mm polyp (tubular adenoma) 7 year surveillance.   ESOPHAGOGASTRODUODENOSCOPY (EGD) WITH PROPOFOL N/A 07/17/2022   small hiatal hernia, gastritis, normal duodenum. negative H.pylori. negative Barrett's.   FOOT SURGERY      Family Psychiatric History: See below  Family History:  Family History  Problem Relation Age of Onset   Bipolar disorder Mother    Depression Maternal Aunt    Depression Maternal Uncle    Urticaria Neg Hx    Immunodeficiency Neg Hx    Eczema Neg Hx    Atopy Neg Hx    Asthma Neg Hx    Angioedema Neg Hx    Allergic rhinitis Neg  Hx     Social History:  Social History   Socioeconomic History   Marital status: Divorced    Spouse name: Not on file   Number of children: Not on file   Years of education: Not on file   Highest education level: Not on file  Occupational History   Not on file  Tobacco Use   Smoking status: Never    Passive exposure: Past   Smokeless tobacco: Never  Vaping Use   Vaping status: Never Used  Substance and Sexual Activity   Alcohol use: Yes    Comment: Rare glass of wine   Drug use: Yes    Types: Marijuana    Comment: smokes cannabis for anxiety    Sexual  activity: Not Currently    Partners: Male    Birth control/protection: Pill  Other Topics Concern   Not on file  Social History Narrative   Not on file   Social Drivers of Health   Financial Resource Strain: Not on file  Food Insecurity: Not on file  Transportation Needs: Not on file  Physical Activity: Not on file  Stress: Not on file  Social Connections: Not on file    Allergies:  Allergies  Allergen Reactions   Voltaren [Diclofenac] Anaphylaxis, Hives and Swelling   Clonazepam Nausea And Vomiting    Migraine   Sulfa Antibiotics Hives   Viibryd [Vilazodone Hcl]     Stomach issues     Metabolic Disorder Labs: No results found for: "HGBA1C", "MPG" No results found for: "PROLACTIN" No results found for: "CHOL", "TRIG", "HDL", "CHOLHDL", "VLDL", "LDLCALC" No results found for: "TSH"  Therapeutic Level Labs: No results found for: "LITHIUM" Lab Results  Component Value Date   VALPROATE 33.8 (L) 03/13/2013   No results found for: "CBMZ"  Current Medications: Current Outpatient Medications  Medication Sig Dispense Refill   ALPRAZolam (XANAX) 1 MG tablet Take 1 tablet (1 mg total) by mouth 4 (four) times daily as needed. 120 tablet 2   desvenlafaxine (PRISTIQ) 50 MG 24 hr tablet Take 1 tablet (50 mg total) by mouth daily. 30 tablet 3   metoprolol succinate (TOPROL-XL) 25 MG 24 hr tablet Take 12.5 mg by mouth daily.     OLANZapine (ZYPREXA) 5 MG tablet Take 1 tablet (5 mg total) by mouth at bedtime. 90 tablet 0   pantoprazole (PROTONIX) 40 MG tablet Take 1 tablet (40 mg total) by mouth 2 (two) times daily before a meal. 60 tablet 11   progesterone (PROMETRIUM) 200 MG capsule Take 200 mg by mouth at bedtime.     rosuvastatin (CRESTOR) 5 MG tablet Take 5 mg by mouth every evening.     valACYclovir (VALTREX) 500 MG tablet Take 500 mg by mouth daily.     No current facility-administered medications for this visit.     Musculoskeletal: Strength & Muscle Tone: within  normal limits Gait & Station: normal Patient leans: N/A  Psychiatric Specialty Exam: Review of Systems  All other systems reviewed and are negative.   There were no vitals taken for this visit.There is no height or weight on file to calculate BMI.  General Appearance: Casual and Fairly Groomed  Eye Contact:  Good  Speech:  Clear and Coherent  Volume:  Normal  Mood:  Anxious and Euthymic  Affect:  Congruent  Thought Process:  Goal Directed  Orientation:  Full (Time, Place, and Person)  Thought Content: WDL   Suicidal Thoughts:  No  Homicidal Thoughts:  No  Memory:  Immediate;   Good Recent;   Good Remote;   Good  Judgement:  Good  Insight:  Good  Psychomotor Activity:  Normal  Concentration:  Concentration: Good and Attention Span: Good  Recall:  Good  Fund of Knowledge: Good  Language: Good  Akathisia:  No  Handed:  Right  AIMS (if indicated): not done  Assets:  Communication Skills Desire for Improvement Physical Health Resilience Social Support  ADL's:  Intact  Cognition: WNL  Sleep:  Good   Screenings: MDI    Flowsheet Row Office Visit from 11/22/2015 in [facility] [place]  Total Score (max 50) 38      PHQ2-9    Flowsheet Row Counselor from 02/01/2022 in BEHAVIORAL [facility]ALTH INTENSIVE PSYCH Most recent reading at 02/05/2022  2:58 PM Video Visit from 02/05/2022 in [facility] [place] Most recent reading at 02/05/2022  1:31 PM Video Visit from 01/04/2022 in [facility] [place] Most recent reading at 01/04/2022  2:35 PM Video Visit from 11/06/2021 in [facility] [place] Most recent reading at 11/06/2021 10:40 AM Video Visit from 09/12/2021 in [facility] [place] Most recent reading at 09/12/2021  4:08 PM  PHQ-2 Total Score 6 4 5 1 4   PHQ-9 Total Score 25 15 10  -- 11      Flowsheet Row Admission  (Discharged) from 07/17/2022 in [facility] Most recent reading at 07/17/2022  9:41 AM Counselor from 02/01/2022 in BEHAVIORAL HEALTH INTENSIVE PSYCH Most recent reading at 02/05/2022  2:58 PM Video Visit from 02/05/2022 in [facility] [place] Most recent reading at 02/05/2022  1:31 PM  C-SSRS RISK CATEGORY No Risk Error: Question 6 not populated Error: Q3, 4, or 5 should not be populated when Q2 is No        Assessment and Plan: This patient is a 53 year old female with a history depression possible bipolar disorder and anxiety.  She continues to do well on her current regimen.  She will continue Pristiq 50 mg daily for depression and anxiety, olanzapine 5 mg at bedtime for mood stabilization and Xanax 1 mg up to 4 times daily for anxiety.  She will return to see me in 2 months  Collaboration of Care: Collaboration of Care: Primary Care Provider AEB notes will be shared with PCP at patient's request  Patient/Guardian was advised Release of Information must be obtained prior to any record release in order to collaborate their care with an outside provider. Patient/Guardian was advised if they have not already done so to contact the registration department to sign all necessary forms in order for [place] to release information regarding their care.   Consent: Patient/Guardian gives verbal consent for treatment and assignment of benefits for services provided during this visit. Patient/Guardian expressed understanding and agreed to proceed.    Diannia Ruder, MD 07/26/2023, 11:02 AM

## 2023-07-31 DIAGNOSIS — M7542 Impingement syndrome of left shoulder: Secondary | ICD-10-CM | POA: Diagnosis not present

## 2023-08-02 DIAGNOSIS — M7542 Impingement syndrome of left shoulder: Secondary | ICD-10-CM | POA: Diagnosis not present

## 2023-08-07 DIAGNOSIS — M7542 Impingement syndrome of left shoulder: Secondary | ICD-10-CM | POA: Diagnosis not present

## 2023-08-10 ENCOUNTER — Other Ambulatory Visit: Payer: Self-pay | Admitting: Gastroenterology

## 2023-08-12 DIAGNOSIS — M7542 Impingement syndrome of left shoulder: Secondary | ICD-10-CM | POA: Diagnosis not present

## 2023-08-12 NOTE — Telephone Encounter (Signed)
 Pt needs to keep her follow up appt

## 2023-08-15 DIAGNOSIS — M7542 Impingement syndrome of left shoulder: Secondary | ICD-10-CM | POA: Diagnosis not present

## 2023-08-20 DIAGNOSIS — M7542 Impingement syndrome of left shoulder: Secondary | ICD-10-CM | POA: Diagnosis not present

## 2023-08-22 DIAGNOSIS — M7542 Impingement syndrome of left shoulder: Secondary | ICD-10-CM | POA: Diagnosis not present

## 2023-08-27 DIAGNOSIS — M7542 Impingement syndrome of left shoulder: Secondary | ICD-10-CM | POA: Diagnosis not present

## 2023-09-02 DIAGNOSIS — M7542 Impingement syndrome of left shoulder: Secondary | ICD-10-CM | POA: Diagnosis not present

## 2023-09-05 DIAGNOSIS — M7542 Impingement syndrome of left shoulder: Secondary | ICD-10-CM | POA: Diagnosis not present

## 2023-09-12 DIAGNOSIS — Z1231 Encounter for screening mammogram for malignant neoplasm of breast: Secondary | ICD-10-CM | POA: Diagnosis not present

## 2023-09-17 DIAGNOSIS — M7542 Impingement syndrome of left shoulder: Secondary | ICD-10-CM | POA: Diagnosis not present

## 2023-09-19 DIAGNOSIS — T148XXA Other injury of unspecified body region, initial encounter: Secondary | ICD-10-CM | POA: Diagnosis not present

## 2023-09-19 DIAGNOSIS — M545 Low back pain, unspecified: Secondary | ICD-10-CM | POA: Diagnosis not present

## 2023-09-19 DIAGNOSIS — R252 Cramp and spasm: Secondary | ICD-10-CM | POA: Diagnosis not present

## 2023-09-19 DIAGNOSIS — Z299 Encounter for prophylactic measures, unspecified: Secondary | ICD-10-CM | POA: Diagnosis not present

## 2023-09-19 DIAGNOSIS — M7542 Impingement syndrome of left shoulder: Secondary | ICD-10-CM | POA: Diagnosis not present

## 2023-09-24 DIAGNOSIS — M7542 Impingement syndrome of left shoulder: Secondary | ICD-10-CM | POA: Diagnosis not present

## 2023-09-25 ENCOUNTER — Ambulatory Visit (INDEPENDENT_AMBULATORY_CARE_PROVIDER_SITE_OTHER): Payer: Medicare HMO | Admitting: Gastroenterology

## 2023-09-25 ENCOUNTER — Telehealth: Payer: Self-pay

## 2023-09-25 ENCOUNTER — Encounter: Payer: Self-pay | Admitting: Gastroenterology

## 2023-09-25 VITALS — BP 99/63 | HR 101 | Temp 98.0°F | Ht 63.0 in | Wt 125.0 lb

## 2023-09-25 DIAGNOSIS — Z860101 Personal history of adenomatous and serrated colon polyps: Secondary | ICD-10-CM | POA: Diagnosis not present

## 2023-09-25 DIAGNOSIS — K219 Gastro-esophageal reflux disease without esophagitis: Secondary | ICD-10-CM

## 2023-09-25 DIAGNOSIS — R159 Full incontinence of feces: Secondary | ICD-10-CM

## 2023-09-25 DIAGNOSIS — Z8719 Personal history of other diseases of the digestive system: Secondary | ICD-10-CM

## 2023-09-25 MED ORDER — RIFAXIMIN 550 MG PO TABS: 550.0000 mg | ORAL_TABLET | Freq: Three times a day (TID) | ORAL | 0 refills | Status: AC

## 2023-09-25 NOTE — Progress Notes (Signed)
 Gastroenterology Office Note     Primary Care Physician:  Ander Bame, FNP  Primary Gastroenterologist: Dr. Mordechai April   Chief Complaint   Chief Complaint  Patient presents with   Follow-up    Pt arrives for follow up on GERD. Taking Protonix  at night. Still having issues in the morning with bowels; having incontinence issues. Had a situation last year such as incontinence. Pt states if she coughs she can have bowel movement on self. No n/v, no abdominal discomfort. Has changed diet; still having stress in life. Has noticed what triggers incontinence issues.      History of Present Illness   Samantha Chambers is a 53 y.o. female presenting today with a history of fecal incontinence, dyspepsia, colonic adenoma in 2024, gastritis, last seen one year ago and here for routine follow-up. Multifactorial etiologies for incontinence likely in setting of possible pelvic floor dysfunction, incomplete closure due to internal hemorrhoids, lax sphincter tone, possible overflow etiology.   She has had significant family stressors over the past few years that she notes coincided with onset of incontinence. Mother 9 has dementia. She had been doing well with improved incontinence until 3 months ago.   Fecal incontinence in mornings for past 3 months. Had been better over the past year till 3 months ago. This morning woke up, coughing and congested, saw soft stool in underwear. Still having urgency during the day. [place] stool 4-6, sometimes mushy. Has fecal urgency in the mornings. Cut out dairy ice cream in evenings as noted this causes it to be more severe. No bloating. Alcohol , dairy, coffee all increase symptoms. No rectal burning, itching, prolapsing tissue.   Coughing and will lose urine and feces. Dicyclomine no improvement in past she believes. Benefiber made stool too hard.   Baseline of at least 3-4 stools in morning, little bits and sometimes big one, then will feel better and nothing  happens after that. +flatus. Feels like she is productive for most part when having stools in morning. In PT with lower back pain. Doing dry needle currently. Prednisone shots since December.,   Had vaginal rejuvenation in the past and urinary incontinence got better.    Pantoprazole  at night just once daily. GERD controlled. Dyspepsia controlled.    Colonoscopy March 2024: non-bleeding internal hemorrhoids, one 5 mm polyp (tubular adenoma) 7 year surveillance.    EGD March 2024: small hiatal hernia, gastritis, normal duodenum. negative H.pylori. negative Barrett's.    Past Medical History:  Diagnosis Date   Angio-edema    Anxiety    Bipolar disorder (HCC)    Depression    Headache(784.0)    Irritable bowel    Urticaria     Past Surgical History:  Procedure Laterality Date   BIOPSY  07/17/2022   Procedure: BIOPSY;  Surgeon: Vinetta Greening, DO;  [facility];  Service: Endoscopy;;   COLONOSCOPY WITH PROPOFOL  N/A 07/17/2022   non-bleeding internal hemorrhoids, one 5 mm polyp (tubular adenoma) 7 year surveillance.   ESOPHAGOGASTRODUODENOSCOPY (EGD) WITH PROPOFOL  N/A 07/17/2022   small hiatal hernia, gastritis, normal duodenum. negative H.pylori. negative Barrett's.   FOOT SURGERY      Current Outpatient Medications  Medication Sig Dispense Refill   ALPRAZolam  (XANAX ) 1 MG tablet Take 1 tablet (1 mg total) by mouth 4 (four) times daily as needed. 120 tablet 2   desvenlafaxine  (PRISTIQ ) 50 MG 24 hr tablet Take 1 tablet (50 mg total) by mouth daily. 30 tablet 3   diazepam  (VALIUM )  10 MG tablet Take 1 tablet by mouth 4 (four) times daily.     metoprolol succinate (TOPROL-XL) 25 MG 24 hr tablet Take 12.5 mg by mouth daily.     OLANZapine  (ZYPREXA ) 5 MG tablet Take 1 tablet (5 mg total) by mouth at bedtime. 90 tablet 0   pantoprazole  (PROTONIX ) 40 MG tablet TAKE 1 TABLET (40 MG TOTAL) BY MOUTH DAILY 30 MINUTES BEFORE BREAKFAST 90 tablet 0   progesterone  (PROMETRIUM )  200 MG capsule Take 200 mg by mouth at bedtime.     rosuvastatin (CRESTOR) 5 MG tablet Take 5 mg by mouth every evening.     Testosterone  20.25 MG/ACT (1.62%) GEL SMARTSIG:1 pump T-DERMAL Every Morning     valACYclovir (VALTREX) 500 MG tablet Take 500 mg by mouth daily.     No current facility-administered medications for this visit.    Allergies as of 09/25/2023 - Review Complete 09/25/2023  Allergen Reaction Noted   Voltaren [diclofenac] Anaphylaxis, Hives, and Swelling 07/13/2022   Clonazepam Nausea And Vomiting 02/13/2013   Sulfa antibiotics Hives 02/13/2013   Viibryd  [vilazodone  hcl]  07/13/2022    Family History  Problem Relation Age of Onset   Bipolar disorder Mother    Depression Maternal Aunt    Depression Maternal Uncle    Urticaria Neg Hx    Immunodeficiency Neg Hx    Eczema Neg Hx    Atopy Neg Hx    Asthma Neg Hx    Angioedema Neg Hx    Allergic rhinitis Neg Hx     Social History   Socioeconomic History   Marital status: Divorced    Spouse name: Not on file   Number of children: Not on file   Years of education: Not on file   Highest education level: Not on file  Occupational History   Not on file  Tobacco Use   Smoking status: Never    Passive exposure: Past   Smokeless tobacco: Never  Vaping Use   Vaping status: Never Used  Substance and Sexual Activity   Alcohol  use: Yes    Comment: Rare glass of wine   Drug use: Yes    Types: Marijuana    Comment: smokes cannabis for anxiety    Sexual activity: Not Currently    Partners: Male    Birth control/protection: Pill  Other Topics Concern   Not on file  Social History Narrative   Not on file   Social Drivers of Health   Financial Resource Strain: Not on file  Food Insecurity: Not on file  Transportation Needs: Not on file  Physical Activity: Not on file  Stress: Not on file  Social Connections: Not on file  Intimate Partner Violence: Not At Risk (11/24/2021)   Received from [facility],  [facility]   Humiliation, Afraid, Rape, and Kick questionnaire    Fear of Current or Ex-Partner: No    Emotionally Abused: No    Physically Abused: No    Sexually Abused: No     Review of Systems   Gen: Denies any fever, chills, fatigue, weight loss, lack of appetite.  CV: Denies chest pain, heart palpitations, peripheral edema, syncope.  Resp: Denies shortness of breath at rest or with exertion. Denies wheezing or cough.  GI: Denies dysphagia or odynophagia. Denies jaundice, hematemesis, fecal incontinence. GU : Denies urinary burning, urinary frequency, urinary hesitancy MS: Denies joint pain, muscle weakness, cramps, or limitation of movement.  Derm: Denies rash, itching, dry skin Psych: Denies depression, anxiety,  memory loss, and confusion Heme: Denies bruising, bleeding, and enlarged lymph nodes.   Physical Exam   BP 99/63   Pulse (!) 101   Temp 98 F (36.7 C)   Ht 5\' 3"  (1.6 m)   Wt 125 lb (56.7 kg)   BMI 22.14 kg/m  General:   Alert and oriented. Pleasant and cooperative. Well-nourished and well-developed.  Head:  Normocephalic and atraumatic. Eyes:  Without icterus Abdomen:  +BS, soft, non-tender and non-distended. No HSM noted. No guarding or rebound. No masses appreciated.  Rectal:  Deferred Msk:  Symmetrical without gross deformities. Normal posture. Extremities:  Without edema. Neurologic:  Alert and  oriented x4;  grossly normal neurologically. Skin:  Intact without significant lesions or rashes. Psych:  Alert and cooperative. Normal mood and affect.   Assessment   Samantha Chambers is a 53 y.o. female presenting today with a history of chronic fecal incontinence dating back to around early 2024, dyspepsia, colonic adenoma in 2024, gastritis, last seen one year ago and here for routine follow-up.    Fecal incontinence: she has noted significant worsening with stressors and actually had been doing well since last seen up until 3 months ago.  Multifactorial etiologies for incontinence likely in setting of suspected pelvic floor dysfunction, incomplete closure due to internal hemorrhoids, lax sphincter tone, possible overflow etiology. However, she does not seem to be constipated and has more of an IBS-D presentation. Noting small amounts of stool in underwear after coughing or upon waking notices in underwear. Colonoscopy on file from 2024 with internal hemorrhoids and one adenoma. Dicyclomine without improvement in past. Fiber caused hard stools. Will trial Xifaxan course and refer for anorectal manometry. I suspect could benefit from biofeedback therapy and needs better management of bowels. Historically, it was felt she may have an overflow incontinence, but she has more frequent stools at this time daily  GERD/dyspepsia: doing well on pantoprazole  once daily. No dysphagia. EGD on file without Barrett's.      PLAN    Referral for anorectal manometry Xifaxan 550 mg TID for 14 days Return in 3 months   Delman Ferns, PhD, [facility]enter[facility]

## 2023-09-25 NOTE — Telephone Encounter (Signed)
 PA done on Cover My Meds for Xifaxan 550mg . Dx used: R15.9. tried/failed: Dicyclomine. Ov notes faxed. Waiting on a response

## 2023-09-25 NOTE — Patient Instructions (Signed)
 I have sent in Xifaxan to take three times a day for 14 days. This should help "reset" your gut.  We are also referring you for anorectal manometry as soon as possible.  Please let me know if no improvement! You can reach out on MyChart to get me easiest.  Will see you in 3 months!   I enjoyed seeing you again today! I value our relationship and want to provide genuine, compassionate, and quality care. You may receive a survey regarding your visit with me, and I welcome your feedback! Thanks so much for taking the time to complete this. I look forward to seeing you again.      Delman Ferns, PhD, ANP-BC [facility] Gastroenterology

## 2023-09-26 ENCOUNTER — Encounter (HOSPITAL_COMMUNITY): Payer: Self-pay | Admitting: Psychiatry

## 2023-09-26 ENCOUNTER — Telehealth (INDEPENDENT_AMBULATORY_CARE_PROVIDER_SITE_OTHER): Admitting: Psychiatry

## 2023-09-26 DIAGNOSIS — F411 Generalized anxiety disorder: Secondary | ICD-10-CM | POA: Diagnosis not present

## 2023-09-26 DIAGNOSIS — F3132 Bipolar disorder, current episode depressed, moderate: Secondary | ICD-10-CM | POA: Diagnosis not present

## 2023-09-26 MED ORDER — OLANZAPINE 5 MG PO TABS: 5.0000 mg | ORAL_TABLET | Freq: Every day | ORAL | 2 refills | Status: DC

## 2023-09-26 MED ORDER — DESVENLAFAXINE SUCCINATE ER 50 MG PO TB24: 50.0000 mg | ORAL_TABLET | Freq: Every day | ORAL | 3 refills | Status: DC

## 2023-09-26 MED ORDER — ALPRAZOLAM 1 MG PO TABS: 1.0000 mg | ORAL_TABLET | Freq: Four times a day (QID) | ORAL | 2 refills | Status: DC | PRN

## 2023-09-26 NOTE — Progress Notes (Signed)
 Virtual Visit via Video Note  I connected with Nyiesha K Dery on 09/26/23 at  9:40 AM EDT by a video enabled telemedicine application and verified that I am speaking with the correct person using two identifiers.  Location: Patient: home Provider: office   I discussed the limitations of evaluation and management by telemedicine and the availability of in person appointments. The patient expressed understanding and agreed to proceed.     I discussed the assessment and treatment plan with the patient. The patient was provided an opportunity to ask questions and all were answered. The patient agreed with the plan and demonstrated an understanding of the instructions.   The patient was advised to call back or seek an in-person evaluation if the symptoms worsen or if the condition fails to improve as anticipated.  I provided 20 minutes of non-face-to-face time during this encounter.   Alfredia Annas, MD  [facility] MD/PA/NP OP Progress Note  09/26/2023 10:25 AM Veola Giovanni  MRN:  [id number]  Chief Complaint:  Chief Complaint  Patient presents with   Anxiety   Depression   Follow-up   HPI: This patient is a 53 year old divorced white female who lives with her teenage daughter in [place].  She is on disability.  The patient returns for follow-up after 2 months regarding her depression anxiety and possible bipolar disorder.  The patient states that she has been very stressed lately.  Her 98 year old daughter has been very much out of control.  She has been drinking staying out at night acting erratically very irritable and angry.  Also the patient is 55 year old mother has dementia and it seems to be worsening lately.  She has been going over to her house to help care for her several times a week.  She states however that her mood has generally been stable and she has not had severe depression or manic symptoms her anxiety is under good control with the Xanax .  Her sleep is a little disrupted but  she does not want to take anything extra right now Visit Diagnosis:    ICD-10-CM   1. Bipolar 1 disorder, depressed, moderate (HCC)  F31.32     2. Generalized anxiety disorder  F41.1       Past Psychiatric History: Long-term outpatient treatment  Past Medical History:  Past Medical History:  Diagnosis Date   Angio-edema    Anxiety    Bipolar disorder (HCC)    Depression    Headache(784.0)    Irritable bowel    Urticaria     Past Surgical History:  Procedure Laterality Date   BIOPSY  07/17/2022   Procedure: BIOPSY;  Surgeon: Vinetta Greening, DO;  [facility];  Service: Endoscopy;;   COLONOSCOPY WITH PROPOFOL  N/A 07/17/2022   non-bleeding internal hemorrhoids, one 5 mm polyp (tubular adenoma) 7 year surveillance.   ESOPHAGOGASTRODUODENOSCOPY (EGD) WITH PROPOFOL  N/A 07/17/2022   small hiatal hernia, gastritis, normal duodenum. negative H.pylori. negative Barrett's.   FOOT SURGERY      Family Psychiatric History: See below  Family History:  Family History  Problem Relation Age of Onset   Bipolar disorder Mother    Depression Maternal Aunt    Depression Maternal Uncle    Urticaria Neg Hx    Immunodeficiency Neg Hx    Eczema Neg Hx    Atopy Neg Hx    Asthma Neg Hx    Angioedema Neg Hx    Allergic rhinitis Neg Hx     Social History:  Social  History   Socioeconomic History   Marital status: Divorced    Spouse name: Not on file   Number of children: Not on file   Years of education: Not on file   Highest education level: Not on file  Occupational History   Not on file  Tobacco Use   Smoking status: Never    Passive exposure: Past   Smokeless tobacco: Never  Vaping Use   Vaping status: Never Used  Substance and Sexual Activity   Alcohol  use: Yes    Comment: Rare glass of wine   Drug use: Yes    Types: Marijuana    Comment: smokes cannabis for anxiety    Sexual activity: Not Currently    Partners: Male    Birth control/protection: Pill   Other Topics Concern   Not on file  Social History Narrative   Not on file   Social Drivers of Health   Financial Resource Strain: Not on file  Food Insecurity: Not on file  Transportation Needs: Not on file  Physical Activity: Not on file  Stress: Not on file  Social Connections: Not on file    Allergies:  Allergies  Allergen Reactions   Voltaren [Diclofenac] Anaphylaxis, Hives and Swelling   Clonazepam Nausea And Vomiting    Migraine   Sulfa Antibiotics Hives   Viibryd  [Vilazodone  Hcl]     Stomach issues     Metabolic Disorder Labs: No results found for: "HGBA1C", "MPG" No results found for: "PROLACTIN" No results found for: "CHOL", "TRIG", "HDL", "CHOLHDL", "VLDL", "LDLCALC" No results found for: "TSH"  Therapeutic Level Labs: No results found for: "LITHIUM" Lab Results  Component Value Date   VALPROATE 33.8 (L) 03/13/2013   No results found for: "CBMZ"  Current Medications: Current Outpatient Medications  Medication Sig Dispense Refill   ALPRAZolam  (XANAX ) 1 MG tablet Take 1 tablet (1 mg total) by mouth 4 (four) times daily as needed. 120 tablet 2   desvenlafaxine  (PRISTIQ ) 50 MG 24 hr tablet Take 1 tablet (50 mg total) by mouth daily. 30 tablet 3   diazepam  (VALIUM ) 10 MG tablet Take 1 tablet by mouth 4 (four) times daily.     metoprolol succinate (TOPROL-XL) 25 MG 24 hr tablet Take 12.5 mg by mouth daily.     OLANZapine  (ZYPREXA ) 5 MG tablet Take 1 tablet (5 mg total) by mouth at bedtime. 90 tablet 2   pantoprazole  (PROTONIX ) 40 MG tablet TAKE 1 TABLET (40 MG TOTAL) BY MOUTH DAILY 30 MINUTES BEFORE BREAKFAST 90 tablet 0   progesterone  (PROMETRIUM ) 200 MG capsule Take 200 mg by mouth at bedtime.     rifaximin (XIFAXAN) 550 MG TABS tablet Take 1 tablet (550 mg total) by mouth 3 (three) times daily for 14 days. 42 tablet 0   rosuvastatin (CRESTOR) 5 MG tablet Take 5 mg by mouth every evening.     Testosterone  20.25 MG/ACT (1.62%) GEL SMARTSIG:1 pump T-DERMAL  Every Morning     valACYclovir (VALTREX) 500 MG tablet Take 500 mg by mouth daily.     No current facility-administered medications for this visit.     Musculoskeletal: Strength & Muscle Tone: within normal limits Gait & Station: normal Patient leans: N/A  Psychiatric Specialty Exam: Review of Systems  Psychiatric/Behavioral:  Positive for sleep disturbance.   All other systems reviewed and are negative.   There were no vitals taken for this visit.There is no height or weight on file to calculate BMI.  General Appearance: Casual and Fairly Groomed  Eye Contact:  Good  Speech:  Clear and Coherent  Volume:  Normal  Mood:  Euthymic  Affect:  Flat  Thought Process:  Goal Directed  Orientation:  Full (Time, Place, and Person)  Thought Content: Rumination   Suicidal Thoughts:  No  Homicidal Thoughts:  No  Memory:  Immediate;   Good Recent;   Good Remote;   Fair  Judgement:  Good  Insight:  Good  Psychomotor Activity:  Normal  Concentration:  Concentration: Good and Attention Span: Good  Recall:  Good  Fund of Knowledge: Good  Language: Good  Akathisia:  No  Handed:  Right  AIMS (if indicated): not done  Assets:  Communication Skills Desire for Improvement Resilience Social Support Talents/Skills  ADL's:  Intact  Cognition: WNL  Sleep:  Fair   Screenings: MDI    Flowsheet Row Office Visit from 11/22/2015 in [facility] at [place]  Total Score (max 50) 38      PHQ2-9    Flowsheet Row Counselor from 02/01/2022 in BEHAVIORAL HEALTH INTENSIVE PSYCH Most recent reading at 02/05/2022  2:58 PM Video Visit from 02/05/2022 in [facility] at [place] Most recent reading at 02/05/2022  1:31 PM Video Visit from 01/04/2022 in [facility] at [place] Most recent reading at 01/04/2022  2:35 PM Video Visit from 11/06/2021 in [facility] at [place] Most recent  reading at 11/06/2021 10:40 AM Video Visit from 09/12/2021 in [facility] at [place] Most recent reading at 09/12/2021  4:08 PM  PHQ-2 Total Score 6 4 5 1 4   PHQ-9 Total Score 25 15 10  -- 11      Flowsheet Row Admission (Discharged) from 07/17/2022 in [facility] Most recent reading at 07/17/2022  9:41 AM Counselor from 02/01/2022 in BEHAVIORAL HEALTH INTENSIVE PSYCH Most recent reading at 02/05/2022  2:58 PM Video Visit from 02/05/2022 in [facility] at [place] Most recent reading at 02/05/2022  1:31 PM  C-SSRS RISK CATEGORY No Risk Error: Question 6 not populated Error: Q3, 4, or 5 should not be populated when Q2 is No        Assessment and Plan: This patient is a 53 year old female with a history of depression possible bipolar disorder and anxiety.  She continues to do well on her current regimen.  She will continue Pristiq  50 mg daily for depression and anxiety, olanzapine  5 mg at bedtime for mood stabilization and Xanax  1 mg up to 4 times daily for anxiety.  She will return to see me in 3 months  Collaboration of Care: Collaboration of Care: Primary Care Provider AEB notes will be shared with PCP at patient's request  Patient/Guardian was advised Release of Information must be obtained prior to any record release in order to collaborate their care with an outside provider. Patient/Guardian was advised if they have not already done so to contact the registration department to sign all necessary forms in order for us  to release information regarding their care.   Consent: Patient/Guardian gives verbal consent for treatment and assignment of benefits for services provided during this visit. Patient/Guardian expressed understanding and agreed to proceed.    Alfredia Annas, MD 09/26/2023, 10:25 AM

## 2023-09-27 ENCOUNTER — Other Ambulatory Visit: Payer: Self-pay | Admitting: *Deleted

## 2023-09-27 DIAGNOSIS — R159 Full incontinence of feces: Secondary | ICD-10-CM

## 2023-10-01 ENCOUNTER — Telehealth: Payer: Self-pay

## 2023-10-01 DIAGNOSIS — K580 Irritable bowel syndrome with diarrhea: Secondary | ICD-10-CM | POA: Insufficient documentation

## 2023-10-01 NOTE — Telephone Encounter (Signed)
 I was told that they would have to transfer care to them and they are booking possibly out into October.

## 2023-10-01 NOTE — Telephone Encounter (Signed)
 The diagnosis code should be IBS-D. ICD K58.0  It should be covered with that. Can we resubmit under cover my meds? Thanks!  Unfortunately, I can't speed up referral to [facility]. However, we could see if [place] has sooner openings.  Tammy: are you able to see if [facility] can do anorectal manometry sooner than November?

## 2023-10-01 NOTE — Telephone Encounter (Signed)
 Samantha Chambers 2 different things 1. Pt was denied for Xifaxan because her medical condition was not approved by the FDA. I will send copy to your email.  2. She wants to know if you can do anything about her referral to [facility]. Its not until November. Pt wants to know what else can be done. Please advise (4 pages sent )

## 2023-10-02 DIAGNOSIS — M7542 Impingement syndrome of left shoulder: Secondary | ICD-10-CM | POA: Diagnosis not present

## 2023-10-02 NOTE — Telephone Encounter (Signed)
 Documentation faxed to [facility] RE: Pharmacy Appeals for an expedited appeal

## 2023-10-07 DIAGNOSIS — M7542 Impingement syndrome of left shoulder: Secondary | ICD-10-CM | POA: Diagnosis not present

## 2023-10-09 NOTE — Telephone Encounter (Signed)
 Documentation for pt regarding her appeal is in media with authorization number [id number] for Xifaxan  valid Sep 25, 2023 through October 16, 2023.

## 2023-10-09 NOTE — Telephone Encounter (Signed)
 Faxed documentation to the pt's pharmacy regarding authorization of the medication.

## 2023-10-09 NOTE — Telephone Encounter (Signed)
 Do we have updates on appeal?

## 2023-10-09 NOTE — Telephone Encounter (Signed)
 Phoned the pt's insurance and spoke with Peterson Brandt regarding the pt's appeal. Was advised it was approved. Advised her to please send us  documentation for the chart as well as the pt. Advised she would. States this could take anywhere from 24 to 48 hours. I will keep a check on [employer] for this

## 2023-10-10 NOTE — Telephone Encounter (Signed)
 Phoned the pt and advised of medication being approved. Pt states she also needs you to help with that referral asap, because she was having sex and pooped the sheets and she hasn't had sex since. Referral needed soon

## 2023-10-10 NOTE — Telephone Encounter (Signed)
 Thanks, please let patient know it's approved!

## 2023-10-14 ENCOUNTER — Other Ambulatory Visit: Payer: Self-pay | Admitting: *Deleted

## 2023-10-14 DIAGNOSIS — R5383 Other fatigue: Secondary | ICD-10-CM | POA: Diagnosis not present

## 2023-10-14 DIAGNOSIS — J069 Acute upper respiratory infection, unspecified: Secondary | ICD-10-CM | POA: Diagnosis not present

## 2023-10-14 DIAGNOSIS — G43909 Migraine, unspecified, not intractable, without status migrainosus: Secondary | ICD-10-CM | POA: Diagnosis not present

## 2023-10-14 DIAGNOSIS — Z299 Encounter for prophylactic measures, unspecified: Secondary | ICD-10-CM | POA: Diagnosis not present

## 2023-10-14 DIAGNOSIS — D509 Iron deficiency anemia, unspecified: Secondary | ICD-10-CM | POA: Diagnosis not present

## 2023-10-14 DIAGNOSIS — R159 Full incontinence of feces: Secondary | ICD-10-CM

## 2023-10-14 NOTE — Telephone Encounter (Signed)
 Yes scheduled for November at [facility]. [facility] is also several months out from previous referrals sent but can send referral if you want to see

## 2023-10-14 NOTE — Telephone Encounter (Signed)
 [name], do we have a status of when [facility] can do the anorectal manometry? It looks like possibly November. How quickly is [facility] doing this?

## 2023-10-14 NOTE — Telephone Encounter (Signed)
Referral placed to [facility] °

## 2023-10-14 NOTE — Telephone Encounter (Signed)
 Yes, I am curious how quickly that could be. Thanks!

## 2023-11-06 ENCOUNTER — Telehealth: Payer: Self-pay | Admitting: Gastroenterology

## 2023-11-06 NOTE — Telephone Encounter (Signed)
 Samantha Chambers,  Referral from [facility] for an anorectal manometry, not transferring care, these are not done at [facility].  Please review and advise scheduling.  Thanks [employer]

## 2023-11-07 ENCOUNTER — Other Ambulatory Visit: Payer: Self-pay

## 2023-11-07 DIAGNOSIS — K5900 Constipation, unspecified: Secondary | ICD-10-CM

## 2023-11-07 DIAGNOSIS — R159 Full incontinence of feces: Secondary | ICD-10-CM

## 2023-11-07 NOTE — Telephone Encounter (Signed)
 Referral for anorectal manometry Referring provider Therisa Stager, NP Appointment and procedure information to the patient. Left a telephone message of the date.

## 2023-11-18 DIAGNOSIS — Z79899 Other long term (current) drug therapy: Secondary | ICD-10-CM | POA: Diagnosis not present

## 2023-11-18 DIAGNOSIS — Z299 Encounter for prophylactic measures, unspecified: Secondary | ICD-10-CM | POA: Diagnosis not present

## 2023-11-18 DIAGNOSIS — E538 Deficiency of other specified B group vitamins: Secondary | ICD-10-CM | POA: Diagnosis not present

## 2023-11-18 DIAGNOSIS — Z0000 Encounter for general adult medical examination without abnormal findings: Secondary | ICD-10-CM | POA: Diagnosis not present

## 2023-11-18 DIAGNOSIS — R5383 Other fatigue: Secondary | ICD-10-CM | POA: Diagnosis not present

## 2023-11-18 DIAGNOSIS — E7800 Pure hypercholesterolemia, unspecified: Secondary | ICD-10-CM | POA: Diagnosis not present

## 2023-11-18 DIAGNOSIS — Z7189 Other specified counseling: Secondary | ICD-10-CM | POA: Diagnosis not present

## 2023-12-18 ENCOUNTER — Encounter (HOSPITAL_COMMUNITY): Payer: Self-pay | Admitting: Psychiatry

## 2023-12-18 ENCOUNTER — Telehealth (HOSPITAL_COMMUNITY): Admitting: Psychiatry

## 2023-12-18 DIAGNOSIS — F3132 Bipolar disorder, current episode depressed, moderate: Secondary | ICD-10-CM | POA: Diagnosis not present

## 2023-12-18 DIAGNOSIS — F411 Generalized anxiety disorder: Secondary | ICD-10-CM | POA: Diagnosis not present

## 2023-12-18 MED ORDER — OLANZAPINE 5 MG PO TABS: 5.0000 mg | ORAL_TABLET | Freq: Every day | ORAL | 2 refills | Status: DC

## 2023-12-18 MED ORDER — ALPRAZOLAM 1 MG PO TABS: 1.0000 mg | ORAL_TABLET | Freq: Four times a day (QID) | ORAL | 2 refills | Status: DC | PRN

## 2023-12-18 MED ORDER — DESVENLAFAXINE SUCCINATE ER 50 MG PO TB24: 50.0000 mg | ORAL_TABLET | Freq: Every day | ORAL | 3 refills | Status: DC

## 2023-12-18 NOTE — Progress Notes (Signed)
 Virtual Visit via Video Note  I connected with Samantha Chambers on 12/18/23 at  4:00 PM EDT by a video enabled telemedicine application and verified that I am speaking with the correct person using two identifiers.  Location: Patient: home Provider: office   I discussed the limitations of evaluation and management by telemedicine and the availability of in person appointments. The patient expressed understanding and agreed to proceed.     I discussed the assessment and treatment plan with the patient. The patient was provided an opportunity to ask questions and all were answered. The patient agreed with the plan and demonstrated an understanding of the instructions.   The patient was advised to call back or seek an in-person evaluation if the symptoms worsen or if the condition fails to improve as anticipated.  I provided 20 minutes of non-face-to-face time during this encounter.   Barnie Gull, MD  [facility] MD/PA/NP OP Progress Note  12/18/2023 4:43 PM Samantha Chambers  MRN:  [id number]  Chief Complaint:  Chief Complaint  Patient presents with   Depression   Anxiety   Follow-up   HPI: This patient is a 53 year old divorced white female who lives with her teenage daughter in [place]. She is on disability. The patient returns for follow-up after 2 months regarding her depression anxiety and possible bipolar disorder.   The patient returns for follow-up after 2 months regarding her bipolar disorder and anxiety.  For the most part she is doing okay although she remains under a lot of stress.  Her teenage daughter has repeatedly gotten out of control and she is center to stay with her father although she still sees her in a fair amount.  She is also taking care of her mother about 4 hours a day.  Her mother has a diagnosis of dementia and has had frequent falls.  The mother's husband is there as well but they may need to get a home health aide.  For the most part the patient's mood has been  stable and she denies significant depression difficulty sleeping or anxiety.  She denies thoughts of self-harm or suicide Visit Diagnosis:    ICD-10-CM   1. Bipolar 1 disorder, depressed, moderate (HCC)  F31.32     2. Generalized anxiety disorder  F41.1       Past Psychiatric History: Long-term outpatient treatment  Past Medical History:  Past Medical History:  Diagnosis Date   Angio-edema    Anxiety    Bipolar disorder (HCC)    Depression    Headache(784.0)    Irritable bowel    Urticaria     Past Surgical History:  Procedure Laterality Date   BIOPSY  07/17/2022   Procedure: BIOPSY;  Surgeon: Cindie Carlin MARLA, DO;  [facility];  Service: Endoscopy;;   COLONOSCOPY WITH PROPOFOL  N/A 07/17/2022   non-bleeding internal hemorrhoids, one 5 mm polyp (tubular adenoma) 7 year surveillance.   ESOPHAGOGASTRODUODENOSCOPY (EGD) WITH PROPOFOL  N/A 07/17/2022   small hiatal hernia, gastritis, normal duodenum. negative H.pylori. negative Barrett's.   FOOT SURGERY      Family Psychiatric History: See below  Family History:  Family History  Problem Relation Age of Onset   Bipolar disorder Mother    Depression Maternal Aunt    Depression Maternal Uncle    Urticaria Neg Hx    Immunodeficiency Neg Hx    Eczema Neg Hx    Atopy Neg Hx    Asthma Neg Hx    Angioedema Neg Hx  Allergic rhinitis Neg Hx     Social History:  Social History   Socioeconomic History   Marital status: Divorced    Spouse name: Not on file   Number of children: Not on file   Years of education: Not on file   Highest education level: Not on file  Occupational History   Not on file  Tobacco Use   Smoking status: Never    Passive exposure: Past   Smokeless tobacco: Never  Vaping Use   Vaping status: Never Used  Substance and Sexual Activity   Alcohol  use: Yes    Comment: Rare glass of wine   Drug use: Yes    Types: Marijuana    Comment: smokes cannabis for anxiety    Sexual activity:  Not Currently    Partners: Male    Birth control/protection: Pill  Other Topics Concern   Not on file  Social History Narrative   Not on file   Social Drivers of Health   Financial Resource Strain: Not on file  Food Insecurity: Not on file  Transportation Needs: Not on file  Physical Activity: Not on file  Stress: Not on file  Social Connections: Not on file    Allergies:  Allergies  Allergen Reactions   Voltaren [Diclofenac] Anaphylaxis, Hives and Swelling   Clonazepam Nausea And Vomiting    Migraine   Sulfa Antibiotics Hives   Viibryd  [Vilazodone  Hcl]     Stomach issues     Metabolic Disorder Labs: No results found for: HGBA1C, MPG No results found for: PROLACTIN No results found for: CHOL, TRIG, HDL, CHOLHDL, VLDL, LDLCALC No results found for: TSH  Therapeutic Level Labs: No results found for: LITHIUM Lab Results  Component Value Date   VALPROATE 33.8 (L) 03/13/2013   No results found for: CBMZ  Current Medications: Current Outpatient Medications  Medication Sig Dispense Refill   ALPRAZolam  (XANAX ) 1 MG tablet Take 1 tablet (1 mg total) by mouth 4 (four) times daily as needed. 120 tablet 2   desvenlafaxine  (PRISTIQ ) 50 MG 24 hr tablet Take 1 tablet (50 mg total) by mouth daily. 30 tablet 3   diazepam  (VALIUM ) 10 MG tablet Take 1 tablet by mouth 4 (four) times daily.     metoprolol succinate (TOPROL-XL) 25 MG 24 hr tablet Take 12.5 mg by mouth daily.     OLANZapine  (ZYPREXA ) 5 MG tablet Take 1 tablet (5 mg total) by mouth at bedtime. 90 tablet 2   pantoprazole  (PROTONIX ) 40 MG tablet TAKE 1 TABLET (40 MG TOTAL) BY MOUTH DAILY 30 MINUTES BEFORE BREAKFAST 90 tablet 0   progesterone  (PROMETRIUM ) 200 MG capsule Take 200 mg by mouth at bedtime.     rosuvastatin (CRESTOR) 5 MG tablet Take 5 mg by mouth every evening.     Testosterone  20.25 MG/ACT (1.62%) GEL SMARTSIG:1 pump T-DERMAL Every Morning     valACYclovir (VALTREX) 500 MG tablet Take  500 mg by mouth daily.     No current facility-administered medications for this visit.     Musculoskeletal: Strength & Muscle Tone: within normal limits Gait & Station: normal Patient leans: N/A  Psychiatric Specialty Exam: Review of Systems  Gastrointestinal:  Positive for rectal pain.  All other systems reviewed and are negative.   There were no vitals taken for this visit.There is no height or weight on file to calculate BMI.  General Appearance: Casual, Neat, and Well Groomed  Eye Contact:  Good  Speech:  Clear and Coherent  Volume:  Normal  Mood:  Euthymic  Affect:  Congruent  Thought Process:  Goal Directed  Orientation:  Full (Time, Place, and Person)  Thought Content: WDL   Suicidal Thoughts:  No  Homicidal Thoughts:  No  Memory:  Immediate;   Good Recent;   Good Remote;   NA  Judgement:  Good  Insight:  Good  Psychomotor Activity:  Normal  Concentration:  Concentration: Good and Attention Span: Good  Recall:  Good  Fund of Knowledge: Good  Language: Good  Akathisia:  No  Handed:  Right  AIMS (if indicated): not done  Assets:  Communication Skills Desire for Improvement Resilience Social Support  ADL's:  Intact  Cognition: WNL  Sleep:  Good   Screenings: MDI    Flowsheet Row Office Visit from 11/22/2015 in [facility] at [place]  Total Score (max 50) 38   PHQ2-9    Flowsheet Row Counselor from 02/01/2022 in BEHAVIORAL HEALTH INTENSIVE PSYCH Most recent reading at 02/05/2022  2:58 PM Video Visit from 02/05/2022 in [facility] at [place] Most recent reading at 02/05/2022  1:31 PM Video Visit from 01/04/2022 in [facility] at [place] Most recent reading at 01/04/2022  2:35 PM Video Visit from 11/06/2021 in [facility] at [place] Most recent reading at 11/06/2021 10:40 AM Video Visit from 09/12/2021 in [facility] Out[facility]  Health at [place] Most recent reading at 09/12/2021  4:08 PM  PHQ-2 Total Score 6 4 5 1 4   PHQ-9 Total Score 25 15 10  -- 11   Flowsheet Row Admission (Discharged) from 07/17/2022 in [facility] Most recent reading at 07/17/2022  9:41 AM Counselor from 02/01/2022 in BEHAVIORAL HEALTH INTENSIVE PSYCH Most recent reading at 02/05/2022  2:58 PM Video Visit from 02/05/2022 in [facility] at [place] Most recent reading at 02/05/2022  1:31 PM  C-SSRS RISK CATEGORY No Risk Error: Question 6 not populated Error: Q3, 4, or 5 should not be populated when Q2 is No     Assessment and Plan: This patient is a 53 year old female with a history of depression possible bipolar disorder and anxiety.  She continues to do well on her current regimen.  She will continue Pristiq  50 mg daily for depression and anxiety, olanzapine  5 mg at bedtime for mood stabilization and Xanax  1 mg up to 4 times daily for anxiety.  She will return to see me in 2 months  Collaboration of Care: Collaboration of Care: Primary Care Provider AEB notes will be shared with PCP at patient's request  Patient/Guardian was advised Release of Information must be obtained prior to any record release in order to collaborate their care with an outside provider. Patient/Guardian was advised if they have not already done so to contact the registration department to sign all necessary forms in order for us  to release information regarding their care.   Consent: Patient/Guardian gives verbal consent for treatment and assignment of benefits for services provided during this visit. Patient/Guardian expressed understanding and agreed to proceed.    Barnie Gull, MD 12/18/2023, 4:43 PM

## 2023-12-25 ENCOUNTER — Encounter: Payer: Self-pay | Admitting: Gastroenterology

## 2023-12-25 ENCOUNTER — Ambulatory Visit (INDEPENDENT_AMBULATORY_CARE_PROVIDER_SITE_OTHER): Admitting: Gastroenterology

## 2023-12-25 ENCOUNTER — Telehealth: Payer: Self-pay | Admitting: Gastroenterology

## 2023-12-25 VITALS — BP 112/68 | HR 85 | Temp 97.5°F | Ht 63.0 in | Wt 129.1 lb

## 2023-12-25 DIAGNOSIS — R159 Full incontinence of feces: Secondary | ICD-10-CM

## 2023-12-25 DIAGNOSIS — Z860101 Personal history of adenomatous and serrated colon polyps: Secondary | ICD-10-CM

## 2023-12-25 DIAGNOSIS — Z8719 Personal history of other diseases of the digestive system: Secondary | ICD-10-CM | POA: Diagnosis not present

## 2023-12-25 MED ORDER — DICYCLOMINE HCL 10 MG PO CAPS: 10.0000 mg | ORAL_CAPSULE | Freq: Two times a day (BID) | ORAL | 0 refills | Status: DC

## 2023-12-25 NOTE — Patient Instructions (Signed)
 We are setting you up for hemorrhoid banding in the near future.  I have sent in dicyclomine .  I want you to only take 1 capsule each morning.  This can cause constipation, dizziness, drowsiness, dry mouth.  I am hoping this will help with your repeated episodes of having to go to the bathroom.  I will see you soon for hemorrhoid banding!  I enjoyed seeing you again today! I value our relationship and want to provide genuine, compassionate, and quality care. You may receive a survey regarding your visit with me, and I welcome your feedback! Thanks so much for taking the time to complete this. I look forward to seeing you again.      Therisa MICAEL Stager, PhD, ANP-BC [facility]

## 2023-12-25 NOTE — Progress Notes (Signed)
 Gastroenterology Office Note     Primary Care Physician:  Leavy Waddell NOVAK, FNP  Primary Gastroenterologist: Cindie   Chief Complaint   Chief Complaint  Patient presents with   Follow-up    Patient here today for a follow up on fecal incontinence. Patient says this is not any better. She is not currently on any antidiarrheals. She says she has had an episode of incontinence with sex. She says she has not had any issues with diarrhea as of late. Samantha Chambers is control on pantoprazole  40 mg daily.       History of Present Illness   Samantha Chambers is a 53 y.o. female presenting today with a history of fecal incontinence, dyspepsia, colonic adenoma in 2024, gastritis, Multifactorial etiologies for incontinence likely in setting of possible pelvic floor dysfunction, incomplete closure due to internal hemorrhoids, lax sphincter tone, possible overflow etiology. She has had significant family stressors over the past few years that she notes coincided with onset of incontinence. Mother 48 has dementia. She had been doing well with improved incontinence until about Feb 2025. Last seen in May 2025 and referred for anorectal manometry.  Dicyclomine  no improvement in past she believes. Benefiber made stool too hard. Trial of Xifaxan  recommended at last visit.   She completed a course of Xifaxan  and states she actually got constipated with this.  She took Linzess for just 1 or 2 days and then got back to her normal.  Her loose stools much improved and now has some form to it.  She states she has no diarrhea now.  However she does have frequency in the mornings going a few times until everything is emptied out.  Stool is very soft and stringy at times.  She really does not feel constipated and does not seem to think that she has any underlying constipation.  She has had issues with cough and seepage of stool.  She did have an episode of small incontinence during sexual intercourse and this is quite  embarrassing to her.  She is eager to do something in the interim until she has her manometry that is upcoming.     Colonoscopy March 2024: non-bleeding internal hemorrhoids, one 5 mm polyp (tubular adenoma) 7 year surveillance.    EGD March 2024: small hiatal hernia, gastritis, normal duodenum. negative H.pylori. negative Barrett's.         Past Medical History:  Diagnosis Date   Angio-edema    Anxiety    Bipolar disorder (HCC)    Depression    Headache(784.0)    Irritable bowel    Urticaria     Past Surgical History:  Procedure Laterality Date   BIOPSY  07/17/2022   Procedure: BIOPSY;  Surgeon: Cindie Carlin POUR, DO;  [facility];  Service: Endoscopy;;   COLONOSCOPY WITH PROPOFOL  N/A 07/17/2022   non-bleeding internal hemorrhoids, one 5 mm polyp (tubular adenoma) 7 year surveillance.   ESOPHAGOGASTRODUODENOSCOPY (EGD) WITH PROPOFOL  N/A 07/17/2022   small hiatal hernia, gastritis, normal duodenum. negative H.pylori. negative Barrett's.   FOOT SURGERY      Current Outpatient Medications  Medication Sig Dispense Refill   ALPRAZolam  (XANAX ) 1 MG tablet Take 1 tablet (1 mg total) by mouth 4 (four) times daily as needed. 120 tablet 2   desvenlafaxine  (PRISTIQ ) 50 MG 24 hr tablet Take 1 tablet (50 mg total) by mouth daily. 30 tablet 3   diazepam  (VALIUM ) 10 MG tablet Take 1 tablet by mouth 4 (four) times daily. (Patient  taking differently: Take 1 tablet by mouth every 6 (six) hours as needed.)     metoprolol succinate (TOPROL-XL) 25 MG 24 hr tablet Take 12.5 mg by mouth daily.     OLANZapine  (ZYPREXA ) 5 MG tablet Take 1 tablet (5 mg total) by mouth at bedtime. 90 tablet 2   pantoprazole  (PROTONIX ) 40 MG tablet TAKE 1 TABLET (40 MG TOTAL) BY MOUTH DAILY 30 MINUTES BEFORE BREAKFAST 90 tablet 0   progesterone  (PROMETRIUM ) 200 MG capsule Take 200 mg by mouth at bedtime.     rosuvastatin (CRESTOR) 5 MG tablet Take 5 mg by mouth every evening.     Testosterone  20.25  MG/ACT (1.62%) GEL SMARTSIG:1 pump T-DERMAL Every Morning (Patient taking differently: Two times per week)     valACYclovir (VALTREX) 500 MG tablet Take 500 mg by mouth daily.     No current facility-administered medications for this visit.    Allergies as of 12/25/2023 - Review Complete 12/25/2023  Allergen Reaction Noted   Voltaren [diclofenac] Anaphylaxis, Hives, and Swelling 07/13/2022   Clonazepam Nausea And Vomiting 02/13/2013   Sulfa antibiotics Hives 02/13/2013   Viibryd  [vilazodone  hcl]  07/13/2022    Family History  Problem Relation Age of Onset   Bipolar disorder Mother    Depression Maternal Aunt    Depression Maternal Uncle    Urticaria Neg Hx    Immunodeficiency Neg Hx    Eczema Neg Hx    Atopy Neg Hx    Asthma Neg Hx    Angioedema Neg Hx    Allergic rhinitis Neg Hx     Social History   Socioeconomic History   Marital status: Divorced    Spouse name: Not on file   Number of children: Not on file   Years of education: Not on file   Highest education level: Not on file  Occupational History   Not on file  Tobacco Use   Smoking status: Never    Passive exposure: Past   Smokeless tobacco: Never  Vaping Use   Vaping status: Never Used  Substance and Sexual Activity   Alcohol  use: Yes    Comment: Rare glass of wine   Drug use: Yes    Types: Marijuana    Comment: smokes cannabis for anxiety    Sexual activity: Not Currently    Partners: Male    Birth control/protection: Pill  Other Topics Concern   Not on file  Social History Narrative   Not on file   Social Drivers of Health   Financial Resource Strain: Not on file  Food Insecurity: Not on file  Transportation Needs: Not on file  Physical Activity: Not on file  Stress: Not on file  Social Connections: Not on file  Intimate Partner Violence: Not At Risk (11/24/2021)   Received from [facility]   Humiliation, Afraid, Rape, and Kick questionnaire    Within the last year, have you been  afraid of your partner or ex-partner?: No    Within the last year, have you been humiliated or emotionally abused in other ways by your partner or ex-partner?: No    Within the last year, have you been kicked, hit, slapped, or otherwise physically hurt by your partner or ex-partner?: No    Within the last year, have you been raped or forced to have any kind of sexual activity by your partner or ex-partner?: No     Review of Systems   Gen: Denies any fever, chills, fatigue, weight loss, lack of appetite.  CV: Denies chest pain, heart palpitations, peripheral edema, syncope.  Resp: Denies shortness of breath at rest or with exertion. Denies wheezing or cough.  GI: Denies dysphagia or odynophagia. Denies jaundice, hematemesis, fecal incontinence. GU : Denies urinary burning, urinary frequency, urinary hesitancy MS: Denies joint pain, muscle weakness, cramps, or limitation of movement.  Derm: Denies rash, itching, dry skin Psych: Denies depression, anxiety, memory loss, and confusion Heme: Denies bruising, bleeding, and enlarged lymph nodes.   Physical Exam   BP 112/68 (BP Location: Left Arm, Patient Position: Sitting, Cuff Size: Normal)   Pulse 85   Temp (!) 97.5 F (36.4 C) (Temporal)   Ht 5' 3 (1.6 m)   Wt 129 lb 1.6 oz (58.6 kg)   BMI 22.87 kg/m  General:   Alert and oriented. Pleasant and cooperative. Well-nourished and well-developed.  Head:  Normocephalic and atraumatic. Eyes:  Without icterus Rectal:  Deferred  Msk:  Symmetrical without gross deformities. Normal posture. Extremities:  Without edema. Neurologic:  Alert and  oriented x4;  grossly normal neurologically. Skin:  Intact without significant lesions or rashes. Psych:  Alert and cooperative. Normal mood and affect.   Assessment   Samantha Chambers is a 53 y.o. female presenting today with ongoing struggles with chronic fecal incontinence.  I recently saw her in May 2025 with plans for anorectal manometry, and  this is coming up in late September.  She is here for early interval follow-up.  Fecal incontinence has broad etiologies at this time including likely pelvic floor dysfunction, incomplete closure due to internal hemorrhoids, lack sphincter tone, possible overflow etiology.  However she continues to deny feeling constipated and actually had improvement with taking Xifaxan  with the form of her stools.  She still does have frequent stools mainly in the morning.  We will trial low-dose dicyclomine  and I have cautioned her on taking this sparingly to avoid constipation.  Will also arrange hemorrhoid banding in the near future to hopefully assist with allowing better sphincter closure in light of swollen hemorrhoids.  I told her that anecdotally I have had some good results with patients after performing this.    Colonoscopy March 2024: non-bleeding internal hemorrhoids, one 5 mm polyp (tubular adenoma) 7 year surveillance.    EGD March 2024: small hiatal hernia, gastritis, normal duodenum. negative H.pylori. negative Barrett's.    PLAN   Keep upcoming anorectal manometry.  Appreciate assistance with [facility] for this. Start low-dose dicyclomine  and titrate this.  Will start with just once a day for now and can increase if needed. Return for hemorrhoid banding in the near future.  Walterine Therisa MICAEL Shirlean, PhD, ANP-BC [facility]

## 2023-12-25 NOTE — Telephone Encounter (Signed)
 PT is scheduled for a procedure at [facility] on 9/26 and she would like to know if there are any later appointments for that day. Please advise.

## 2023-12-26 NOTE — Telephone Encounter (Signed)
 Checked schedule at [facility]. The patient is scheduled for 8:00 am on 01/31/24 for anorectal manometry. She is driving in from [place]. Advised patient there are no openings later in the day. To reschedule her procedure will push it out several months. She decided she will manage with the date and time she has already. Also advised she should reschedule the hemorrhoid banding she has with Dr Shirlean 2 days before the manometry or at least ask if this is good timing from Dr [facility] recommendations.

## 2024-01-13 DIAGNOSIS — R5382 Chronic fatigue, unspecified: Secondary | ICD-10-CM | POA: Diagnosis not present

## 2024-01-13 DIAGNOSIS — R891 Abnormal level of hormones in specimens from other organs, systems and tissues: Secondary | ICD-10-CM | POA: Diagnosis not present

## 2024-01-14 DIAGNOSIS — Z7289 Other problems related to lifestyle: Secondary | ICD-10-CM | POA: Diagnosis not present

## 2024-01-14 DIAGNOSIS — Z780 Asymptomatic menopausal state: Secondary | ICD-10-CM | POA: Diagnosis not present

## 2024-01-29 ENCOUNTER — Ambulatory Visit: Admitting: Gastroenterology

## 2024-01-30 DIAGNOSIS — Z23 Encounter for immunization: Secondary | ICD-10-CM | POA: Diagnosis not present

## 2024-01-30 DIAGNOSIS — Z299 Encounter for prophylactic measures, unspecified: Secondary | ICD-10-CM | POA: Diagnosis not present

## 2024-01-30 DIAGNOSIS — Z0000 Encounter for general adult medical examination without abnormal findings: Secondary | ICD-10-CM | POA: Diagnosis not present

## 2024-01-30 DIAGNOSIS — E538 Deficiency of other specified B group vitamins: Secondary | ICD-10-CM | POA: Diagnosis not present

## 2024-01-31 ENCOUNTER — Ambulatory Visit (HOSPITAL_COMMUNITY)
Admission: RE | Admit: 2024-01-31 | Discharge: 2024-01-31 | Disposition: A | Discharge status: Home / Self Care | Attending: Gastroenterology | Admitting: Gastroenterology

## 2024-01-31 ENCOUNTER — Encounter (HOSPITAL_COMMUNITY)
Admission: RE | Disposition: A | Discharge status: Home / Self Care | Payer: Self-pay | Source: Home / Self Care | Attending: Gastroenterology

## 2024-01-31 DIAGNOSIS — K6289 Other specified diseases of anus and rectum: Secondary | ICD-10-CM | POA: Diagnosis not present

## 2024-01-31 DIAGNOSIS — K5902 Outlet dysfunction constipation: Secondary | ICD-10-CM

## 2024-01-31 DIAGNOSIS — R159 Full incontinence of feces: Secondary | ICD-10-CM | POA: Diagnosis not present

## 2024-01-31 HISTORY — PX: ANAL RECTAL MANOMETRY: SHX6358

## 2024-01-31 SURGERY — MANOMETRY, ANORECTAL

## 2024-01-31 NOTE — Progress Notes (Signed)
 Anal manometry done per protocol. Jasmine Newkirk, ET in room during test.  Pt tolerated well without distress or complication

## 2024-02-03 ENCOUNTER — Encounter (HOSPITAL_COMMUNITY): Payer: Self-pay | Admitting: Gastroenterology

## 2024-02-11 ENCOUNTER — Telehealth: Payer: Self-pay

## 2024-02-11 NOTE — Telephone Encounter (Signed)
 Pt calling for the results of her Manometry from 01/31/2024.

## 2024-02-13 NOTE — Telephone Encounter (Signed)
 Reviewed the procedure notes. It was scanned in yesterday, so I haven't received it till then, which is why it took some time.  She has weak internal and external anal sphincter muscles. She has dyssynergic defecation. Recommending pelvic floor physical therapy for biofeedback.   If this is not helpful, recommend referral to colorectal surgery to evaluate for InterStim  I sent MyChart message. Please also let her know, as I am unsure if she uses MyChart.

## 2024-02-17 NOTE — Telephone Encounter (Signed)
 Usually done at  [facility] location.

## 2024-02-17 NOTE — Telephone Encounter (Signed)
 Referral pool, can you help answer where PT would be?  I have answered the rest.

## 2024-02-19 ENCOUNTER — Other Ambulatory Visit: Payer: Self-pay | Admitting: *Deleted

## 2024-02-19 DIAGNOSIS — R152 Fecal urgency: Secondary | ICD-10-CM

## 2024-02-21 ENCOUNTER — Other Ambulatory Visit: Payer: Self-pay | Admitting: *Deleted

## 2024-02-21 DIAGNOSIS — R159 Full incontinence of feces: Secondary | ICD-10-CM

## 2024-02-21 DIAGNOSIS — R152 Fecal urgency: Secondary | ICD-10-CM

## 2024-03-11 ENCOUNTER — Encounter: Payer: Self-pay | Admitting: Gastroenterology

## 2024-03-11 ENCOUNTER — Ambulatory Visit (INDEPENDENT_AMBULATORY_CARE_PROVIDER_SITE_OTHER): Admitting: Gastroenterology

## 2024-03-11 VITALS — BP 95/63 | HR 81 | Temp 98.0°F | Ht 63.0 in | Wt 127.4 lb

## 2024-03-11 DIAGNOSIS — K641 Second degree hemorrhoids: Secondary | ICD-10-CM | POA: Diagnosis not present

## 2024-03-11 NOTE — Patient Instructions (Signed)
   Please avoid straining.  You should limit your toilet time to 2-3 minutes at the most.    Please call me with any concerns or issues!  I will see you in follow-up for additional banding in several weeks.     I enjoyed seeing you again today! I value our relationship and want to provide genuine, compassionate, and quality care. You may receive a survey regarding your visit with me, and I welcome your feedback! Thanks so much for taking the time to complete this. I look forward to seeing you again.      Therisa MICAEL Stager, PhD, ANP-BC [facility]

## 2024-03-11 NOTE — Progress Notes (Signed)
    [facility] BANDING PROCEDURE NOTE  CHRISTIEN BERTHELOT is a 53 y.o. female presenting today for consideration of hemorrhoid banding. Last colonoscopy March 2024: non-bleeding internal hemorrhoids, one 5 mm polyp (tubular adenoma) 7 year surveillance. Manometry has been completed in interim from last visit with weak internal and external anal sphincter muscles. She has dyssynergic defecation. Recommending pelvic floor physical therapy for biofeedback. This is upcoming. We are attempting banding in case this will help with sphincter closure in light of internal hemorrhoids. She has occasional low volume bleeding and leaking/soiling. Upcoming biofeedback in [place].    The patient presents with symptomatic grade 2 hemorrhoids, unresponsive to maximal medical therapy, requesting rubber band ligation of her hemorrhoidal disease. All risks, benefits, and alternative forms of therapy were described and informed consent was obtained.  In the left lateral decubitus position, anoscopic examination revealed grade 2 hemorrhoids in all positions, with left lateral most predominant.   The decision was made to band the left lateral internal hemorrhoid, and the [facility] O'Regan System was used to perform band ligation without complication. Digital anorectal examination was then performed to assure proper positioning of the band, and to adjust the banded tissue as required. The patient was discharged home without pain or other issues. Dietary and behavioral recommendations were given, along with follow-up instructions. The patient will return in several weeks for followup and possible additional banding as required. We will likely band both columns at next visit.   No complications were encountered and the patient tolerated the procedure well.   Therisa MICAEL Stager, PhD, ANP-BC [facility]

## 2024-03-18 ENCOUNTER — Encounter (HOSPITAL_COMMUNITY): Payer: Self-pay | Admitting: Psychiatry

## 2024-03-18 ENCOUNTER — Ambulatory Visit (INDEPENDENT_AMBULATORY_CARE_PROVIDER_SITE_OTHER): Admitting: Psychiatry

## 2024-03-18 VITALS — BP 108/68 | HR 78 | Ht 63.0 in | Wt 125.8 lb

## 2024-03-18 DIAGNOSIS — F900 Attention-deficit hyperactivity disorder, predominantly inattentive type: Secondary | ICD-10-CM

## 2024-03-18 DIAGNOSIS — F411 Generalized anxiety disorder: Secondary | ICD-10-CM

## 2024-03-18 DIAGNOSIS — F3132 Bipolar disorder, current episode depressed, moderate: Secondary | ICD-10-CM | POA: Diagnosis not present

## 2024-03-18 MED ORDER — OLANZAPINE 5 MG PO TABS: 5.0000 mg | ORAL_TABLET | Freq: Every day | ORAL | 2 refills | Status: DC

## 2024-03-18 MED ORDER — DESVENLAFAXINE SUCCINATE ER 50 MG PO TB24: 50.0000 mg | ORAL_TABLET | Freq: Every day | ORAL | 3 refills | Status: DC

## 2024-03-18 MED ORDER — ALPRAZOLAM 1 MG PO TABS: 1.0000 mg | ORAL_TABLET | Freq: Four times a day (QID) | ORAL | 2 refills | Status: DC | PRN

## 2024-03-18 NOTE — Progress Notes (Signed)
 [facility] MD/PA/NP OP Progress Note  03/18/2024 11:36 AM Samantha Chambers  MRN:  [id number]  Chief Complaint:  Chief Complaint  Patient presents with   Depression   Follow-up   Anxiety   HPI: This patient is a 53 year old divorced white female who lives with her teenage daughter in [place].  She is on disability.  The patient returns for follow-up after 3 months regarding her bipolar disorder and generalized anxiety disorder.  For the most part she has been stable.  She still dealing with her mother who has dementia although the mother and stepfather do not want her is involved in the care anymore.  She is also dealing with her teenage daughter who tends to get out of control.  However the daughter is getting closer to age 38 and has been spending most of her time with her boyfriend.  The patient states overall her mood has been stable and she has not had any significant mood swings with her bipolar disorder.  She denies having depressed mood or thoughts of self-harm or suicide.  She is sleeping fairly well.  She denies significant anxiety or panic attacks. Visit Diagnosis:    ICD-10-CM   1. Bipolar 1 disorder, depressed, moderate (HCC)  F31.32     2. Generalized anxiety disorder  F41.1     3. Attention deficit hyperactivity disorder (ADHD), predominantly inattentive type  F90.0       Past Psychiatric History: Long-term outpatient treatment  Past Medical History:  Past Medical History:  Diagnosis Date   Angio-edema    Anxiety    Bipolar disorder (HCC)    Depression    Headache(784.0)    Irritable bowel    Urticaria     Past Surgical History:  Procedure Laterality Date   ANAL RECTAL MANOMETRY N/A 01/31/2024   Procedure: MANOMETRY, ANORECTAL;  Surgeon: Shila Gustav GAILS, MD;  Location: [facility];  Service: Gastroenterology;  Laterality: N/A;   BIOPSY  07/17/2022   Procedure: BIOPSY;  Surgeon: Cindie Carlin MARLA, DO;  [facility];  Service: Endoscopy;;   COLONOSCOPY  WITH PROPOFOL  N/A 07/17/2022   non-bleeding internal hemorrhoids, one 5 mm polyp (tubular adenoma) 7 year surveillance.   ESOPHAGOGASTRODUODENOSCOPY (EGD) WITH PROPOFOL  N/A 07/17/2022   small hiatal hernia, gastritis, normal duodenum. negative H.pylori. negative Barrett's.   FOOT SURGERY      Family Psychiatric History: See below  Family History:  Family History  Problem Relation Age of Onset   Bipolar disorder Mother    Depression Maternal Aunt    Depression Maternal Uncle    Urticaria Neg Hx    Immunodeficiency Neg Hx    Eczema Neg Hx    Atopy Neg Hx    Asthma Neg Hx    Angioedema Neg Hx    Allergic rhinitis Neg Hx     Social History:  Social History   Socioeconomic History   Marital status: Divorced    Spouse name: Not on file   Number of children: Not on file   Years of education: Not on file   Highest education level: Not on file  Occupational History   Not on file  Tobacco Use   Smoking status: Never    Passive exposure: Past   Smokeless tobacco: Never  Vaping Use   Vaping status: Never Used  Substance and Sexual Activity   Alcohol  use: Yes    Comment: Rare glass of wine   Drug use: Yes    Types: Marijuana    Comment: smokes  cannabis for anxiety    Sexual activity: Not Currently    Partners: Male    Birth control/protection: Pill  Other Topics Concern   Not on file  Social History Narrative   Not on file   Social Drivers of Health   Financial Resource Strain: Not on file  Food Insecurity: No Food Insecurity (01/14/2024)   Received from [facility]   Hunger Vital Sign    Within the past 12 months, you worried that your food would run out before you got the money to buy more.: Never true    Within the past 12 months, the food you bought just didn't last and you didn't have money to get more.: Never true  Transportation Needs: No Transportation Needs (01/14/2024)   Received from [facility] - [facility]    Lack of Transportation  (Medical): No    Lack of Transportation (Non-Medical): No  Physical Activity: Not on file  Stress: Not on file  Social Connections: Not on file    Allergies:  Allergies  Allergen Reactions   Voltaren [Diclofenac] Anaphylaxis, Hives and Swelling   Clonazepam Nausea And Vomiting    Migraine   Sulfa Antibiotics Hives   Viibryd  [Vilazodone  Hcl]     Stomach issues     Metabolic Disorder Labs: No results found for: HGBA1C, MPG No results found for: PROLACTIN No results found for: CHOL, TRIG, HDL, CHOLHDL, VLDL, LDLCALC No results found for: TSH  Therapeutic Level Labs: No results found for: LITHIUM Lab Results  Component Value Date   VALPROATE 33.8 (L) 03/13/2013   No results found for: CBMZ  Current Medications: Current Outpatient Medications  Medication Sig Dispense Refill   diazepam  (VALIUM ) 10 MG tablet Take 1 tablet by mouth 4 (four) times daily. (Patient taking differently: Take 1 tablet by mouth every 6 (six) hours as needed.)     dicyclomine  (BENTYL ) 10 MG capsule Take 1 capsule (10 mg total) by mouth in the morning and at bedtime. As needed. Monitor for constipation. 90 capsule 0   estradiol  (VIVELLE -DOT) 0.075 MG/24HR 1 patch 2 (two) times a week.     metoprolol succinate (TOPROL-XL) 25 MG 24 hr tablet Take 12.5 mg by mouth daily.     pantoprazole  (PROTONIX ) 40 MG tablet TAKE 1 TABLET (40 MG TOTAL) BY MOUTH DAILY 30 MINUTES BEFORE BREAKFAST 90 tablet 0   progesterone  (PROMETRIUM ) 200 MG capsule Take 200 mg by mouth at bedtime.     rosuvastatin (CRESTOR) 5 MG tablet Take 5 mg by mouth every evening.     Testosterone  20.25 MG/ACT (1.62%) GEL SMARTSIG:1 pump T-DERMAL Every Morning (Patient taking differently: Two times per week)     valACYclovir (VALTREX) 500 MG tablet Take 500 mg by mouth daily.     ALPRAZolam  (XANAX ) 1 MG tablet Take 1 tablet (1 mg total) by mouth 4 (four) times daily as needed. 120 tablet 2   desvenlafaxine  (PRISTIQ ) 50 MG 24  hr tablet Take 1 tablet (50 mg total) by mouth daily. 30 tablet 3   OLANZapine  (ZYPREXA ) 5 MG tablet Take 1 tablet (5 mg total) by mouth at bedtime. 90 tablet 2   No current facility-administered medications for this visit.     Musculoskeletal: Strength & Muscle Tone: within normal limits Gait & Station: normal Patient leans: N/A  Psychiatric Specialty Exam: Review of Systems  Gastrointestinal:  Positive for diarrhea.  All other systems reviewed and are negative.   Blood pressure 108/68, pulse 78, height 5'  3 (1.6 m), weight 125 lb 12.8 oz (57.1 kg), SpO2 97%.Body mass index is 22.28 kg/m.  General Appearance: Casual and Fairly Groomed  Eye Contact:  Good  Speech:  Clear and Coherent  Volume:  Normal  Mood:  Euthymic  Affect:  Congruent  Thought Process:  Goal Directed  Orientation:  Full (Time, Place, and Person)  Thought Content: WDL   Suicidal Thoughts:  No  Homicidal Thoughts:  No  Memory:  Immediate;   Good Recent;   Good Remote;   Good  Judgement:  Good  Insight:  Good  Psychomotor Activity:  Normal  Concentration:  Concentration: Good and Attention Span: Good  Recall:  Good  Fund of Knowledge: Good  Language: Good  Akathisia:  No  Handed:  Right  AIMS (if indicated): not done  Assets:  Communication Skills Desire for Improvement Physical Health Resilience Social Support Talents/Skills  ADL's:  Intact  Cognition: WNL  Sleep:  Good   Screenings: MDI    Flowsheet Row Office Visit from 11/22/2015 in [facility] at [place]  Total Score (max 50) 38   PHQ2-9    Flowsheet Row Counselor from 02/01/2022 in BEHAVIORAL HEALTH INTENSIVE PSYCH Most recent reading at 02/05/2022  2:58 PM Video Visit from 02/05/2022 in [facility] at [place] Most recent reading at 02/05/2022  1:31 PM Video Visit from 01/04/2022 in [facility] at [place] Most recent reading at 01/04/2022   2:35 PM Video Visit from 11/06/2021 in [facility] at [place] Most recent reading at 11/06/2021 10:40 AM Video Visit from 09/12/2021 in [facility] at [place] Most recent reading at 09/12/2021  4:08 PM  PHQ-2 Total Score 6 4 5 1 4   PHQ-9 Total Score 25 15 10  -- 11   Flowsheet Row Admission (Discharged) from 07/17/2022 in [facility] Most recent reading at 07/17/2022  9:41 AM Counselor from 02/01/2022 in BEHAVIORAL HEALTH INTENSIVE PSYCH Most recent reading at 02/05/2022  2:58 PM Video Visit from 02/05/2022 in [facility] at [place] Most recent reading at 02/05/2022  1:31 PM  C-SSRS RISK CATEGORY No Risk Error: Question 6 not populated Error: Q3, 4, or 5 should not be populated when Q2 is No     Assessment and Plan: This patient is a 53 year old female with a history of bipolar disorder and generalized anxiety disorder.  She continues to do well on her current regimen.  She will continue olanzapine  5 mg at bedtime for mood stabilization for bipolar disorder, Pristiq  50 mg daily for the depression associated with bipolar disorder and Xanax  1 mg up to 4 times daily for anxiety.  She will return to see me in 3 months  Collaboration of Care: Collaboration of Care: Primary Care Provider AEB notes will be shared with PCP at patient's request  Patient/Guardian was advised Release of Information must be obtained prior to any record release in order to collaborate their care with an outside provider. Patient/Guardian was advised if they have not already done so to contact the registration department to sign all necessary forms in order for us  to release information regarding their care.   Consent: Patient/Guardian gives verbal consent for treatment and assignment of benefits for services provided during this visit. Patient/Guardian expressed understanding and agreed to proceed.    Barnie Gull, MD 03/18/2024,  11:36 AM

## 2024-04-07 ENCOUNTER — Ambulatory Visit

## 2024-04-15 ENCOUNTER — Ambulatory Visit (INDEPENDENT_AMBULATORY_CARE_PROVIDER_SITE_OTHER): Admitting: Gastroenterology

## 2024-04-15 ENCOUNTER — Encounter: Payer: Self-pay | Admitting: Gastroenterology

## 2024-04-15 VITALS — BP 93/63 | HR 83 | Temp 97.0°F | Ht 63.0 in | Wt 128.4 lb

## 2024-04-15 DIAGNOSIS — K641 Second degree hemorrhoids: Secondary | ICD-10-CM

## 2024-04-15 NOTE — Progress Notes (Signed)
° ° ° ° ° °  [facility] BANDING PROCEDURE NOTE  Samantha Chambers is a 53 y.o. female presenting today for consideration of hemorrhoid banding. Last colonoscopy March 2024: non-bleeding internal hemorrhoids, one 5 mm polyp (tubular adenoma) 7 year surveillance. Manometry has been completed in interim from last visit with weak internal and external anal sphincter muscles. She has dyssynergic defecation. Recommending pelvic floor physical therapy for biofeedback . She has had left lateral banding. Slight improvement. Notes intermittent rectal bleeding.    The patient presents with symptomatic grade 2 hemorrhoids, unresponsive to maximal medical therapy, requesting rubber band ligation of her hemorrhoidal disease. All risks, benefits, and alternative forms of therapy were described and informed consent was obtained.   The decision was made to band the right posterior internal hemorrhoid, and the [facility] O'Regan System was used to perform band ligation without complication. Digital anorectal examination was then performed to assure proper positioning of the band, and to adjust the banded tissue as required. The patient was discharged home without pain or other issues. Dietary and behavioral recommendations were given, along with follow-up instructions. The patient will return in several weeks for followup and possible additional banding as required.  No complications were encountered and the patient tolerated the procedure well.   Therisa MICAEL Stager, PhD, ANP-BC [facility]

## 2024-04-15 NOTE — Patient Instructions (Signed)
   Please avoid straining.  You should limit your toilet time to 2-3 minutes at the most.    Please call me with any concerns or issues!  I will see you in follow-up for additional banding in several weeks.     I enjoyed seeing you again today! I value our relationship and want to provide genuine, compassionate, and quality care. You may receive a survey regarding your visit with me, and I welcome your feedback! Thanks so much for taking the time to complete this. I look forward to seeing you again.      Therisa MICAEL Stager, PhD, ANP-BC [facility]

## 2024-05-19 ENCOUNTER — Encounter: Admitting: Gastroenterology

## 2024-06-11 ENCOUNTER — Encounter (HOSPITAL_COMMUNITY): Payer: Self-pay | Admitting: Psychiatry

## 2024-06-11 ENCOUNTER — Telehealth (HOSPITAL_COMMUNITY): Admitting: Psychiatry

## 2024-06-11 DIAGNOSIS — F3132 Bipolar disorder, current episode depressed, moderate: Secondary | ICD-10-CM

## 2024-06-11 DIAGNOSIS — F411 Generalized anxiety disorder: Secondary | ICD-10-CM

## 2024-06-11 MED ORDER — DESVENLAFAXINE SUCCINATE ER 50 MG PO TB24: 50.0000 mg | ORAL_TABLET | Freq: Every day | ORAL | 3 refills | Status: AC

## 2024-06-11 MED ORDER — ALPRAZOLAM 1 MG PO TABS: 1.0000 mg | ORAL_TABLET | Freq: Four times a day (QID) | ORAL | 2 refills | Status: AC | PRN

## 2024-06-11 MED ORDER — OLANZAPINE 5 MG PO TABS: 5.0000 mg | ORAL_TABLET | Freq: Every day | ORAL | 2 refills | Status: AC

## 2024-06-11 NOTE — Progress Notes (Signed)
 Virtual Visit via Video Note  I connected with Samantha Chambers on 06/11/24 at  4:00 PM EST by a video enabled telemedicine application and verified that I am speaking with the correct person using two identifiers.  Location: Patient: home Provider: office   I discussed the limitations of evaluation and management by telemedicine and the availability of in person appointments. The patient expressed understanding and agreed to proceed.     I discussed the assessment and treatment plan with the patient. The patient was provided an opportunity to ask questions and all were answered. The patient agreed with the plan and demonstrated an understanding of the instructions.   The patient was advised to call back or seek an in-person evaluation if the symptoms worsen or if the condition fails to improve as anticipated.  I provided 20 minutes of non-face-to-face time during this encounter.   Barnie Gull, MD  [facility] MD/PA/NP OP Progress Note  06/11/2024 4:19 PM Samantha Chambers  MRN:  [id number]  Chief Complaint:  Chief Complaint  Patient presents with   Anxiety   Depression   Follow-up   HPI: This patient is a 54 year old divorced white female who lives with her teenage daughter in [place]. She is on disability.   The patient returns for follow-up after 3 months regarding her bipolar disorder and generalized anxiety disorder.  She is still dealing with her almost 52 year old daughter who is been belligerent angry and destructive by her report.  She is hoping that her daughter will move out at the end of the school year.  Overall however she is dealing with it fairly well and does not feel significantly depressed or anxious.  She is sleeping well.  She denies significant mood swings or manic symptoms such as racing thoughts or impulsivity.  She denies anxiety attacks. Visit Diagnosis:    ICD-10-CM   1. Bipolar 1 disorder, depressed, moderate (HCC)  F31.32     2. Generalized anxiety disorder   F41.1       Past Psychiatric History: Long-term outpatient treatment  Past Medical History:  Past Medical History:  Diagnosis Date   Angio-edema    Anxiety    Bipolar disorder (HCC)    Depression    Headache(784.0)    Irritable bowel    Urticaria     Past Surgical History:  Procedure Laterality Date   ANAL RECTAL MANOMETRY N/A 01/31/2024   Procedure: MANOMETRY, ANORECTAL;  Surgeon: Shila Gustav GAILS, MD;  Location: [facility];  Service: Gastroenterology;  Laterality: N/A;   BIOPSY  07/17/2022   Procedure: BIOPSY;  Surgeon: Cindie Carlin MARLA, DO;  Location: [facility];  Service: Endoscopy;;   COLONOSCOPY WITH PROPOFOL  N/A 07/17/2022   non-bleeding internal hemorrhoids, one 5 mm polyp (tubular adenoma) 7 year surveillance.   ESOPHAGOGASTRODUODENOSCOPY (EGD) WITH PROPOFOL  N/A 07/17/2022   small hiatal hernia, gastritis, normal duodenum. negative H.pylori. negative Barrett's.   FOOT SURGERY      Family Psychiatric History: See below  Family History:  Family History  Problem Relation Age of Onset   Bipolar disorder Mother    Depression Maternal Aunt    Depression Maternal Uncle    Urticaria Neg Hx    Immunodeficiency Neg Hx    Eczema Neg Hx    Atopy Neg Hx    Asthma Neg Hx    Angioedema Neg Hx    Allergic rhinitis Neg Hx     Social History:  Social History   Socioeconomic History   Marital status: Divorced  Spouse name: Not on file   Number of children: Not on file   Years of education: Not on file   Highest education level: Not on file  Occupational History   Not on file  Tobacco Use   Smoking status: Never    Passive exposure: Past   Smokeless tobacco: Never  Vaping Use   Vaping status: Never Used  Substance and Sexual Activity   Alcohol  use: Yes    Comment: Rare glass of wine   Drug use: Yes    Types: Marijuana    Comment: smokes cannabis for anxiety    Sexual activity: Not Currently    Partners: Male    Birth control/protection: Pill   Other Topics Concern   Not on file  Social History Narrative   Not on file   Social Drivers of Health   Tobacco Use: Low Risk (06/11/2024)   Patient History    Smoking Tobacco Use: Never    Smokeless Tobacco Use: Never    Passive Exposure: Past  Financial Resource Strain: Not on file  Food Insecurity: No Food Insecurity (01/14/2024)   Received from [facility]   Epic    Within the past 12 months, you worried that your food would run out before you got the money to buy more.: Never true    Within the past 12 months, the food you bought just didn't last and you didn't have money to get more.: Never true  Transportation Needs: No Transportation Needs (01/14/2024)   Received from [facility]   PRAPARE - Transportation    Lack of Transportation (Medical): No    Lack of Transportation (Non-Medical): No  Physical Activity: Not on file  Stress: Not on file  Social Connections: Not on file  Depression (PHQ2-9): High Risk (02/05/2022)   Depression (PHQ2-9)    PHQ-2 Score: 25  Alcohol  Screen: Not on file  Housing: Not on file  Utilities: Low Risk (01/14/2024)   Received from [facility]   Utilities    Within the past 12 months, have you been unable to get utilities(heat, electricity) when it was really needed?: No  Health Literacy: Low Risk (07/10/2022)   Received from [facility] Literacy    How often do you need to have someone help you when you read instructions, pamphlets, or other written material from your doctor or pharmacy?: Never    Allergies: Allergies[1]  Metabolic Disorder Labs: No results found for: HGBA1C, MPG No results found for: PROLACTIN No results found for: CHOL, TRIG, HDL, CHOLHDL, VLDL, LDLCALC No results found for: TSH  Therapeutic Level Labs: No results found for: LITHIUM Lab Results  Component Value Date   VALPROATE 33.8 (L) 03/13/2013   No results found for: CBMZ  Current Medications: Current Outpatient  Medications  Medication Sig Dispense Refill   ALPRAZolam  (XANAX ) 1 MG tablet Take 1 tablet (1 mg total) by mouth 4 (four) times daily as needed. 120 tablet 2   desvenlafaxine  (PRISTIQ ) 50 MG 24 hr tablet Take 1 tablet (50 mg total) by mouth daily. 30 tablet 3   diazepam  (VALIUM ) 10 MG tablet Take 1 tablet by mouth 4 (four) times daily. (Patient taking differently: Take 1 tablet by mouth every 6 (six) hours as needed.)     estradiol  (VIVELLE -DOT) 0.075 MG/24HR 1 patch 2 (two) times a week.     meloxicam (MOBIC) 15 MG tablet Take 1 tablet by mouth daily.     metoprolol succinate (TOPROL-XL) 25 MG  24 hr tablet Take 12.5 mg by mouth daily.     OLANZapine  (ZYPREXA ) 5 MG tablet Take 1 tablet (5 mg total) by mouth at bedtime. 90 tablet 2   pantoprazole  (PROTONIX ) 40 MG tablet TAKE 1 TABLET (40 MG TOTAL) BY MOUTH DAILY 30 MINUTES BEFORE BREAKFAST 90 tablet 0   progesterone  (PROMETRIUM ) 200 MG capsule Take 200 mg by mouth at bedtime.     rosuvastatin (CRESTOR) 5 MG tablet Take 5 mg by mouth every evening.     Testosterone  20.25 MG/ACT (1.62%) GEL SMARTSIG:1 pump T-DERMAL Every Morning (Patient taking differently: Three times per week)     tiZANidine (ZANAFLEX) 2 MG tablet Take 2 mg by mouth every 8 (eight) hours as needed.     valACYclovir (VALTREX) 500 MG tablet Take 500 mg by mouth daily.     No current facility-administered medications for this visit.     Musculoskeletal: Strength & Muscle Tone: within normal limits Gait & Station: normal Patient leans: N/A  Psychiatric Specialty Exam: Review of Systems  All other systems reviewed and are negative.   There were no vitals taken for this visit.There is no height or weight on file to calculate BMI.  General Appearance: Casual and Fairly Groomed  Eye Contact:  Good  Speech:  Clear and Coherent  Volume:  Normal  Mood:  Euthymic  Affect:  Congruent  Thought Process:  Goal Directed  Orientation:  Full (Time, Place, and Person)  Thought  Content: WDL   Suicidal Thoughts:  No  Homicidal Thoughts:  No  Memory:  Immediate;   Good Recent;   Good Remote;   Good  Judgement:  Good  Insight:  Good  Psychomotor Activity:  Normal  Concentration:  Concentration: Good and Attention Span: Good  Recall:  Good  Fund of Knowledge: Good  Language: Good  Akathisia:  No  Handed:  Right  AIMS (if indicated): not done  Assets:  Communication Skills Desire for Improvement Physical Health Resilience Social Support Talents/Skills  ADL's:  Intact  Cognition: WNL  Sleep:  Good   Screenings: MDI    Flowsheet Row Office Visit from 11/22/2015 in [facility] [place]  Total Score (max 50) 38   PHQ2-9    Flowsheet Row Counselor from 02/01/2022 in [facility] Most recent reading at 02/05/2022  2:58 PM Video Visit from 02/05/2022 in [facility] [place] Most recent reading at 02/05/2022  1:31 PM Video Visit from 01/04/2022 in [facility] [place] Most recent reading at 01/04/2022  2:35 PM Video Visit from 11/06/2021 in [facility] [place] Most recent reading at 11/06/2021 10:40 AM Video Visit from 09/12/2021 in [facility] [place] Most recent reading at 09/12/2021  4:08 PM  PHQ-2 Total Score 6 4 5 1 4   PHQ-9 Total Score 25 15 10  -- 11   Flowsheet Row Admission (Discharged) from 07/17/2022 in [facility] Most recent reading at 07/17/2022  9:41 AM Counselor from 02/01/2022 in BE[facility]AL [facility] INTENSIVE PSYCH Most recent reading at 02/05/2022  2:58 PM Video Visit from 02/05/2022 in [facility] at [place] Most recent reading at 02/05/2022  1:31 PM  C-SSRS RISK CATEGORY No Risk Error: Question 6 not populated Error: Q3, 4, or 5 should not be populated when Q2 is No     Assessment and Plan: This patient is a 54 year old female  with a history of bipolar  disorder and generalized anxiety disorder.  She continues to do well on her current regimen.  She will continue Pristiq  50 mg daily for depression associated with bipolar disorder, olanzapine  5 mg at bedtime for mood stabilization and Xanax  1 mg up to 4 times daily for anxiety.  She will return to see me in 3 months  Collaboration of Care: Collaboration of Care: Primary Care Provider AEB notes will be shared with PCP at patient's request  Patient/Guardian was advised Release of Information must be obtained prior to any record release in order to collaborate their care with an outside provider. Patient/Guardian was advised if they have not already done so to contact the registration department to sign all necessary forms in order for us  to release information regarding their care.   Consent: Patient/Guardian gives verbal consent for treatment and assignment of benefits for services provided during this visit. Patient/Guardian expressed understanding and agreed to proceed.    Barnie Gull, MD 06/11/2024, 4:19 PM     [1]  Allergies Allergen Reactions   Voltaren [Diclofenac] Anaphylaxis, Hives and Swelling   Clonazepam Nausea And Vomiting    Migraine   Sulfa Antibiotics Hives   Viibryd  [Vilazodone  Hcl]     Stomach issues

## 2024-06-18 ENCOUNTER — Encounter: Admitting: Gastroenterology
